# Patient Record
Sex: Male | Born: 1935
Health system: Southern US, Community
[De-identification: ages and names within clinical notes are randomized; demographics above are authoritative.]

## PROBLEM LIST (undated history)

## (undated) DIAGNOSIS — I10 Essential (primary) hypertension: Secondary | ICD-10-CM

## (undated) DIAGNOSIS — M1990 Unspecified osteoarthritis, unspecified site: Secondary | ICD-10-CM

## (undated) DIAGNOSIS — R7303 Prediabetes: Secondary | ICD-10-CM

## (undated) DIAGNOSIS — N400 Enlarged prostate without lower urinary tract symptoms: Secondary | ICD-10-CM

## (undated) DIAGNOSIS — E559 Vitamin D deficiency, unspecified: Secondary | ICD-10-CM

## (undated) DIAGNOSIS — M109 Gout, unspecified: Secondary | ICD-10-CM

## (undated) DIAGNOSIS — K219 Gastro-esophageal reflux disease without esophagitis: Secondary | ICD-10-CM

## (undated) DIAGNOSIS — E785 Hyperlipidemia, unspecified: Secondary | ICD-10-CM

## (undated) HISTORY — DX: Hyperlipidemia, unspecified: E78.5

## (undated) HISTORY — PX: LEG SURGERY: SHX1003

## (undated) HISTORY — DX: Prediabetes: R73.03

## (undated) HISTORY — PX: BACK SURGERY: SHX140

## (undated) HISTORY — PX: EYE SURGERY: SHX253

## (undated) HISTORY — DX: Unspecified osteoarthritis, unspecified site: M19.90

## (undated) HISTORY — DX: Vitamin D deficiency, unspecified: E55.9

## (undated) HISTORY — DX: Benign prostatic hyperplasia without lower urinary tract symptoms: N40.0

## (undated) HISTORY — PX: HEMORRHOID SURGERY: SHX153

## (undated) HISTORY — DX: Gout, unspecified: M10.9

## (undated) HISTORY — DX: Gastro-esophageal reflux disease without esophagitis: K21.9

---
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
## 1998-01-25 ENCOUNTER — Other Ambulatory Visit: Admission: RE | Admit: 1998-01-25 | Discharge: 1998-01-25 | Payer: Self-pay | Admitting: Internal Medicine

## 1998-08-04 ENCOUNTER — Ambulatory Visit (HOSPITAL_COMMUNITY): Admission: RE | Admit: 1998-08-04 | Discharge: 1998-08-04 | Payer: Self-pay | Admitting: Internal Medicine

## 1998-08-04 ENCOUNTER — Encounter: Payer: Self-pay | Admitting: Internal Medicine

## 1999-08-18 ENCOUNTER — Ambulatory Visit (HOSPITAL_COMMUNITY): Admission: RE | Admit: 1999-08-18 | Discharge: 1999-08-18 | Payer: Self-pay | Admitting: Internal Medicine

## 1999-08-18 ENCOUNTER — Encounter: Payer: Self-pay | Admitting: Internal Medicine

## 2000-12-25 ENCOUNTER — Encounter: Payer: Self-pay | Admitting: Internal Medicine

## 2000-12-25 ENCOUNTER — Ambulatory Visit (HOSPITAL_COMMUNITY): Admission: RE | Admit: 2000-12-25 | Discharge: 2000-12-25 | Payer: Self-pay | Admitting: Internal Medicine

## 2007-07-22 ENCOUNTER — Ambulatory Visit (HOSPITAL_COMMUNITY): Admission: RE | Admit: 2007-07-22 | Discharge: 2007-07-22 | Payer: Self-pay | Admitting: Internal Medicine

## 2009-08-04 ENCOUNTER — Ambulatory Visit (HOSPITAL_COMMUNITY): Admission: RE | Admit: 2009-08-04 | Discharge: 2009-08-04 | Payer: Self-pay | Admitting: Internal Medicine

## 2010-03-17 ENCOUNTER — Emergency Department (HOSPITAL_COMMUNITY): Admission: EM | Admit: 2010-03-17 | Discharge: 2010-03-17 | Payer: Self-pay | Admitting: Family Medicine

## 2010-05-23 ENCOUNTER — Encounter: Admission: RE | Admit: 2010-05-23 | Discharge: 2010-05-23 | Payer: Self-pay | Admitting: Internal Medicine

## 2010-07-04 ENCOUNTER — Emergency Department (HOSPITAL_COMMUNITY): Admission: EM | Admit: 2010-07-04 | Discharge: 2010-07-04 | Payer: Self-pay | Admitting: Emergency Medicine

## 2010-07-13 ENCOUNTER — Encounter: Admission: RE | Admit: 2010-07-13 | Discharge: 2010-07-13 | Payer: Self-pay | Admitting: Neurology

## 2010-11-01 LAB — CBC
HCT: 41.1 % (ref 39.0–52.0)
MCH: 32.2 pg (ref 26.0–34.0)
Platelets: 174 10*3/uL (ref 150–400)
RDW: 13.3 % (ref 11.5–15.5)

## 2010-11-01 LAB — DIFFERENTIAL
Eosinophils Relative: 1 % (ref 0–5)
Lymphocytes Relative: 33 % (ref 12–46)
Monocytes Absolute: 0.6 10*3/uL (ref 0.1–1.0)
Neutro Abs: 3.9 10*3/uL (ref 1.7–7.7)
Neutrophils Relative %: 57 % (ref 43–77)

## 2011-02-23 ENCOUNTER — Other Ambulatory Visit (HOSPITAL_COMMUNITY): Payer: Self-pay | Admitting: Internal Medicine

## 2011-02-23 ENCOUNTER — Ambulatory Visit (HOSPITAL_COMMUNITY)
Admission: RE | Admit: 2011-02-23 | Discharge: 2011-02-23 | Disposition: A | Discharge status: Home / Self Care | Payer: Medicare Other | Source: Ambulatory Visit | Attending: Internal Medicine | Admitting: Internal Medicine

## 2011-02-23 DIAGNOSIS — M79609 Pain in unspecified limb: Secondary | ICD-10-CM

## 2011-02-23 DIAGNOSIS — S9030XA Contusion of unspecified foot, initial encounter: Secondary | ICD-10-CM | POA: Insufficient documentation

## 2011-02-23 DIAGNOSIS — W1809XA Striking against other object with subsequent fall, initial encounter: Secondary | ICD-10-CM | POA: Insufficient documentation

## 2011-02-23 DIAGNOSIS — M7989 Other specified soft tissue disorders: Secondary | ICD-10-CM | POA: Insufficient documentation

## 2011-07-28 ENCOUNTER — Emergency Department (INDEPENDENT_AMBULATORY_CARE_PROVIDER_SITE_OTHER): Payer: Medicare Other

## 2011-07-28 ENCOUNTER — Encounter: Payer: Self-pay | Admitting: *Deleted

## 2011-07-28 ENCOUNTER — Emergency Department (INDEPENDENT_AMBULATORY_CARE_PROVIDER_SITE_OTHER)
Admission: EM | Admit: 2011-07-28 | Discharge: 2011-07-28 | Disposition: A | Discharge status: Home / Self Care | Payer: Medicare Other | Source: Home / Self Care | Attending: Emergency Medicine | Admitting: Emergency Medicine

## 2011-07-28 DIAGNOSIS — S8000XA Contusion of unspecified knee, initial encounter: Secondary | ICD-10-CM

## 2011-07-28 DIAGNOSIS — S40019A Contusion of unspecified shoulder, initial encounter: Secondary | ICD-10-CM

## 2011-07-28 DIAGNOSIS — S40012A Contusion of left shoulder, initial encounter: Secondary | ICD-10-CM

## 2011-07-28 DIAGNOSIS — S8002XA Contusion of left knee, initial encounter: Secondary | ICD-10-CM

## 2011-07-28 DIAGNOSIS — S40011A Contusion of right shoulder, initial encounter: Secondary | ICD-10-CM

## 2011-07-28 DIAGNOSIS — S8001XA Contusion of right knee, initial encounter: Secondary | ICD-10-CM

## 2011-07-28 HISTORY — DX: Essential (primary) hypertension: I10

## 2011-07-28 MED ORDER — HYDROCODONE-ACETAMINOPHEN 5-325 MG PO TABS: ORAL_TABLET | ORAL | Status: AC
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
## 2011-11-23 DIAGNOSIS — Z79899 Other long term (current) drug therapy: Secondary | ICD-10-CM | POA: Diagnosis not present

## 2011-11-23 DIAGNOSIS — Z1212 Encounter for screening for malignant neoplasm of rectum: Secondary | ICD-10-CM | POA: Diagnosis not present

## 2011-11-23 DIAGNOSIS — I10 Essential (primary) hypertension: Secondary | ICD-10-CM | POA: Diagnosis not present

## 2011-11-23 DIAGNOSIS — E559 Vitamin D deficiency, unspecified: Secondary | ICD-10-CM | POA: Diagnosis not present

## 2011-11-23 DIAGNOSIS — R7309 Other abnormal glucose: Secondary | ICD-10-CM | POA: Diagnosis not present

## 2011-11-23 DIAGNOSIS — E782 Mixed hyperlipidemia: Secondary | ICD-10-CM | POA: Diagnosis not present

## 2011-11-23 DIAGNOSIS — Z125 Encounter for screening for malignant neoplasm of prostate: Secondary | ICD-10-CM | POA: Diagnosis not present

## 2011-12-11 DIAGNOSIS — I10 Essential (primary) hypertension: Secondary | ICD-10-CM | POA: Diagnosis not present

## 2011-12-11 DIAGNOSIS — E782 Mixed hyperlipidemia: Secondary | ICD-10-CM | POA: Diagnosis not present

## 2011-12-25 DIAGNOSIS — M159 Polyosteoarthritis, unspecified: Secondary | ICD-10-CM | POA: Diagnosis not present

## 2011-12-25 DIAGNOSIS — I10 Essential (primary) hypertension: Secondary | ICD-10-CM | POA: Diagnosis not present

## 2012-03-06 ENCOUNTER — Emergency Department (HOSPITAL_COMMUNITY): Payer: Medicare Other

## 2012-03-06 ENCOUNTER — Emergency Department (HOSPITAL_COMMUNITY)
Admission: EM | Admit: 2012-03-06 | Discharge: 2012-03-06 | Disposition: A | Discharge status: Home / Self Care | Payer: Medicare Other | Attending: Emergency Medicine | Admitting: Emergency Medicine

## 2012-03-06 ENCOUNTER — Encounter (HOSPITAL_COMMUNITY): Payer: Self-pay | Admitting: *Deleted

## 2012-03-06 DIAGNOSIS — M549 Dorsalgia, unspecified: Secondary | ICD-10-CM | POA: Diagnosis not present

## 2012-03-06 DIAGNOSIS — Y92009 Unspecified place in unspecified non-institutional (private) residence as the place of occurrence of the external cause: Secondary | ICD-10-CM | POA: Insufficient documentation

## 2012-03-06 DIAGNOSIS — K8020 Calculus of gallbladder without cholecystitis without obstruction: Secondary | ICD-10-CM | POA: Insufficient documentation

## 2012-03-06 DIAGNOSIS — M25579 Pain in unspecified ankle and joints of unspecified foot: Secondary | ICD-10-CM | POA: Diagnosis not present

## 2012-03-06 DIAGNOSIS — M79609 Pain in unspecified limb: Secondary | ICD-10-CM | POA: Diagnosis not present

## 2012-03-06 DIAGNOSIS — I10 Essential (primary) hypertension: Secondary | ICD-10-CM | POA: Diagnosis not present

## 2012-03-06 DIAGNOSIS — M7989 Other specified soft tissue disorders: Secondary | ICD-10-CM | POA: Diagnosis not present

## 2012-03-06 DIAGNOSIS — R51 Headache: Secondary | ICD-10-CM | POA: Diagnosis not present

## 2012-03-06 DIAGNOSIS — S20219A Contusion of unspecified front wall of thorax, initial encounter: Secondary | ICD-10-CM | POA: Diagnosis not present

## 2012-03-06 DIAGNOSIS — K5730 Diverticulosis of large intestine without perforation or abscess without bleeding: Secondary | ICD-10-CM | POA: Diagnosis not present

## 2012-03-06 DIAGNOSIS — R109 Unspecified abdominal pain: Secondary | ICD-10-CM | POA: Diagnosis not present

## 2012-03-06 DIAGNOSIS — M542 Cervicalgia: Secondary | ICD-10-CM | POA: Diagnosis not present

## 2012-03-06 DIAGNOSIS — M25559 Pain in unspecified hip: Secondary | ICD-10-CM | POA: Diagnosis not present

## 2012-03-06 DIAGNOSIS — M19079 Primary osteoarthritis, unspecified ankle and foot: Secondary | ICD-10-CM | POA: Diagnosis not present

## 2012-03-06 DIAGNOSIS — R079 Chest pain, unspecified: Secondary | ICD-10-CM | POA: Diagnosis not present

## 2012-03-06 DIAGNOSIS — Z79899 Other long term (current) drug therapy: Secondary | ICD-10-CM | POA: Diagnosis not present

## 2012-03-06 DIAGNOSIS — Y93H9 Activity, other involving exterior property and land maintenance, building and construction: Secondary | ICD-10-CM | POA: Insufficient documentation

## 2012-03-06 LAB — COMPREHENSIVE METABOLIC PANEL
ALT: 21 U/L (ref 0–53)
Alkaline Phosphatase: 56 U/L (ref 39–117)
BUN: 17 mg/dL (ref 6–23)
CO2: 24 mEq/L (ref 19–32)
GFR calc Af Amer: >90 mL/min (ref >90)
GFR calc non Af Amer: >90 mL/min (ref >90)
Glucose, Bld: 101 mg/dL — ABNORMAL HIGH (ref 70–99)
Potassium: 4.2 mEq/L (ref 3.5–5.1)
Sodium: 137 mEq/L (ref 135–145)
Total Bilirubin: 0.7 mg/dL (ref 0.3–1.2)
Total Protein: 6.9 g/dL (ref 6.0–8.3)

## 2012-03-06 LAB — CBC
HCT: 43.7 % (ref 39.0–52.0)
Hemoglobin: 14.6 g/dL (ref 13.0–17.0)
MCHC: 33.4 g/dL (ref 30.0–36.0)
RBC: 4.62 MIL/uL (ref 4.22–5.81)

## 2012-03-06 MED ORDER — IOHEXOL 300 MG/ML  SOLN
100.0000 mL | Freq: Once | INTRAMUSCULAR | Status: AC | PRN
  Administered 2012-03-06: 100 mL via INTRAVENOUS

## 2012-03-06 MED ORDER — OXYCODONE-ACETAMINOPHEN 5-325 MG PO TABS: 1.0000 | ORAL_TABLET | ORAL | Status: AC | PRN

## 2012-03-06 MED ORDER — HYDROMORPHONE HCL PF 1 MG/ML IJ SOLN
1.0000 mg | Freq: Once | INTRAMUSCULAR | Status: AC
  Administered 2012-03-06: 1 mg via INTRAVENOUS
  Filled 2012-03-06: qty 1

## 2012-03-06 NOTE — Progress Notes (Signed)
Orthopedic Tech Progress Note Patient Details:  Tracy Brandt 06-25-1930 [id number]  Ortho Devices Type of Ortho Device: ASO Ortho Device/Splint Location: right LE Ortho Device/Splint Interventions: Application   Prabhjot Maddux T 03/06/2012, 5:33 PM

## 2012-03-06 NOTE — Progress Notes (Signed)
Responded to lvl 2 pg.  Located pt son in hallway of ed outside tr B.  Introduced myself and offered pastoral presence and support while pt was treated.  Assisted son with visiting pt once able per staff.  Pt went for ct.  Offered to be available as needed.  Pt son thanked me for support and presence.  Will follow-up as needed or requested.
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
## 2012-03-06 NOTE — ED Notes (Signed)
Pt back from CT

## 2012-03-06 NOTE — ED Notes (Signed)
Pt to CT

## 2012-03-12 DIAGNOSIS — Z79899 Other long term (current) drug therapy: Secondary | ICD-10-CM | POA: Diagnosis not present

## 2012-03-12 DIAGNOSIS — E559 Vitamin D deficiency, unspecified: Secondary | ICD-10-CM | POA: Diagnosis not present

## 2012-03-12 DIAGNOSIS — R7309 Other abnormal glucose: Secondary | ICD-10-CM | POA: Diagnosis not present

## 2012-03-12 DIAGNOSIS — I10 Essential (primary) hypertension: Secondary | ICD-10-CM | POA: Diagnosis not present

## 2012-03-12 DIAGNOSIS — E782 Mixed hyperlipidemia: Secondary | ICD-10-CM | POA: Diagnosis not present

## 2012-06-12 DIAGNOSIS — E559 Vitamin D deficiency, unspecified: Secondary | ICD-10-CM | POA: Diagnosis not present

## 2012-06-12 DIAGNOSIS — E782 Mixed hyperlipidemia: Secondary | ICD-10-CM | POA: Diagnosis not present

## 2012-06-12 DIAGNOSIS — M159 Polyosteoarthritis, unspecified: Secondary | ICD-10-CM | POA: Diagnosis not present

## 2012-06-12 DIAGNOSIS — Z79899 Other long term (current) drug therapy: Secondary | ICD-10-CM | POA: Diagnosis not present

## 2012-06-12 DIAGNOSIS — R7309 Other abnormal glucose: Secondary | ICD-10-CM | POA: Diagnosis not present

## 2012-06-12 DIAGNOSIS — I10 Essential (primary) hypertension: Secondary | ICD-10-CM | POA: Diagnosis not present

## 2012-11-22 DIAGNOSIS — Z1212 Encounter for screening for malignant neoplasm of rectum: Secondary | ICD-10-CM | POA: Diagnosis not present

## 2012-11-22 DIAGNOSIS — E559 Vitamin D deficiency, unspecified: Secondary | ICD-10-CM | POA: Diagnosis not present

## 2012-11-22 DIAGNOSIS — R7309 Other abnormal glucose: Secondary | ICD-10-CM | POA: Diagnosis not present

## 2012-11-22 DIAGNOSIS — I10 Essential (primary) hypertension: Secondary | ICD-10-CM | POA: Diagnosis not present

## 2012-11-22 DIAGNOSIS — Z79899 Other long term (current) drug therapy: Secondary | ICD-10-CM | POA: Diagnosis not present

## 2012-11-22 DIAGNOSIS — E782 Mixed hyperlipidemia: Secondary | ICD-10-CM | POA: Diagnosis not present

## 2012-11-22 DIAGNOSIS — Z125 Encounter for screening for malignant neoplasm of prostate: Secondary | ICD-10-CM | POA: Diagnosis not present

## 2012-12-01 ENCOUNTER — Emergency Department (HOSPITAL_COMMUNITY): Payer: Medicare Other

## 2012-12-01 ENCOUNTER — Encounter (HOSPITAL_COMMUNITY): Payer: Self-pay

## 2012-12-01 ENCOUNTER — Inpatient Hospital Stay (HOSPITAL_COMMUNITY)
Admission: EM | Admit: 2012-12-01 | Discharge: 2012-12-03 | DRG: 193 | Disposition: A | Discharge status: Home Health | Payer: Medicare Other | Attending: Internal Medicine | Admitting: Internal Medicine

## 2012-12-01 DIAGNOSIS — J9600 Acute respiratory failure, unspecified whether with hypoxia or hypercapnia: Secondary | ICD-10-CM | POA: Diagnosis not present

## 2012-12-01 DIAGNOSIS — A419 Sepsis, unspecified organism: Secondary | ICD-10-CM

## 2012-12-01 DIAGNOSIS — D696 Thrombocytopenia, unspecified: Secondary | ICD-10-CM | POA: Diagnosis present

## 2012-12-01 DIAGNOSIS — Z79899 Other long term (current) drug therapy: Secondary | ICD-10-CM

## 2012-12-01 DIAGNOSIS — R6520 Severe sepsis without septic shock: Secondary | ICD-10-CM | POA: Diagnosis not present

## 2012-12-01 DIAGNOSIS — J984 Other disorders of lung: Secondary | ICD-10-CM | POA: Diagnosis not present

## 2012-12-01 DIAGNOSIS — I10 Essential (primary) hypertension: Secondary | ICD-10-CM | POA: Diagnosis not present

## 2012-12-01 DIAGNOSIS — IMO0002 Reserved for concepts with insufficient information to code with codable children: Secondary | ICD-10-CM | POA: Diagnosis not present

## 2012-12-01 DIAGNOSIS — J189 Pneumonia, unspecified organism: Secondary | ICD-10-CM | POA: Diagnosis not present

## 2012-12-01 DIAGNOSIS — E871 Hypo-osmolality and hyponatremia: Secondary | ICD-10-CM | POA: Diagnosis present

## 2012-12-01 DIAGNOSIS — I499 Cardiac arrhythmia, unspecified: Secondary | ICD-10-CM

## 2012-12-01 DIAGNOSIS — I498 Other specified cardiac arrhythmias: Secondary | ICD-10-CM | POA: Diagnosis not present

## 2012-12-01 DIAGNOSIS — E872 Acidosis: Secondary | ICD-10-CM

## 2012-12-01 DIAGNOSIS — R0602 Shortness of breath: Secondary | ICD-10-CM | POA: Diagnosis not present

## 2012-12-01 LAB — URINALYSIS, ROUTINE W REFLEX MICROSCOPIC
Ketones, ur: NEGATIVE mg/dL
Leukocytes, UA: NEGATIVE
Nitrite: NEGATIVE
pH: 5 (ref 5.0–8.0)

## 2012-12-01 LAB — PRO B NATRIURETIC PEPTIDE: Pro B Natriuretic peptide (BNP): 1909 pg/mL — ABNORMAL HIGH (ref 0–450)

## 2012-12-01 LAB — COMPREHENSIVE METABOLIC PANEL
Albumin: 3.1 g/dL — ABNORMAL LOW (ref 3.5–5.2)
BUN: 11 mg/dL (ref 6–23)
Chloride: 99 mEq/L (ref 96–112)
Creatinine, Ser: 0.63 mg/dL (ref 0.50–1.35)
Total Bilirubin: 2.1 mg/dL — ABNORMAL HIGH (ref 0.3–1.2)
Total Protein: 6.1 g/dL (ref 6.0–8.3)

## 2012-12-01 LAB — CBC
HCT: 38.1 % — ABNORMAL LOW (ref 39.0–52.0)
MCH: 32 pg (ref 26.0–34.0)
MCHC: 34.4 g/dL (ref 30.0–36.0)
MCV: 92.9 fL (ref 78.0–100.0)
RDW: 12.3 % (ref 11.5–15.5)

## 2012-12-01 LAB — TROPONIN I: Troponin I: <0.3 ng/mL (ref <0.30)

## 2012-12-01 LAB — LACTIC ACID, PLASMA: Lactic Acid, Venous: 2.3 mmol/L — ABNORMAL HIGH (ref 0.5–2.2)

## 2012-12-01 MED ORDER — ATENOLOL 100 MG PO TABS
100.0000 mg | ORAL_TABLET | Freq: Every day | ORAL | Status: DC
  Filled 2012-12-01: qty 1

## 2012-12-01 MED ORDER — PREDNISONE 2.5 MG PO TABS
2.5000 mg | ORAL_TABLET | Freq: Every day | ORAL | Status: DC
  Administered 2012-12-02 – 2012-12-03 (×2): 2.5 mg via ORAL
  Filled 2012-12-01 (×2): qty 1

## 2012-12-01 MED ORDER — HYDROCODONE-ACETAMINOPHEN 5-325 MG PO TABS
0.5000 | ORAL_TABLET | Freq: Four times a day (QID) | ORAL | Status: DC | PRN
  Administered 2012-12-02 – 2012-12-03 (×5): 1 via ORAL
  Filled 2012-12-01 (×5): qty 1

## 2012-12-01 MED ORDER — DEXTROSE 5 % IV SOLN
500.0000 mg | INTRAVENOUS | Status: DC
  Administered 2012-12-02: 500 mg via INTRAVENOUS
  Filled 2012-12-01 (×2): qty 500

## 2012-12-01 MED ORDER — LISINOPRIL 20 MG PO TABS: 20.0000 mg | ORAL_TABLET | Freq: Two times a day (BID) | ORAL | Status: DC

## 2012-12-01 MED ORDER — SODIUM CHLORIDE 0.9 % IV SOLN: 250.0000 mL | INTRAVENOUS | Status: DC | PRN

## 2012-12-01 MED ORDER — DEXTROSE 5 % IV SOLN
500.0000 mg | Freq: Once | INTRAVENOUS | Status: DC
  Administered 2012-12-01: 500 mg via INTRAVENOUS
  Filled 2012-12-01: qty 500

## 2012-12-01 MED ORDER — LISINOPRIL 20 MG PO TABS
20.0000 mg | ORAL_TABLET | Freq: Every day | ORAL | Status: DC
  Administered 2012-12-02 – 2012-12-03 (×2): 20 mg via ORAL
  Filled 2012-12-01 (×2): qty 1

## 2012-12-01 MED ORDER — SODIUM CHLORIDE 0.9 % IJ SOLN
3.0000 mL | Freq: Two times a day (BID) | INTRAMUSCULAR | Status: DC
  Administered 2012-12-01 – 2012-12-03 (×4): 3 mL via INTRAVENOUS

## 2012-12-01 MED ORDER — DEXTROSE 5 % IV SOLN
1.0000 g | Freq: Once | INTRAVENOUS | Status: AC
  Administered 2012-12-01: 1 g via INTRAVENOUS
  Filled 2012-12-01: qty 10

## 2012-12-01 MED ORDER — LISINOPRIL 40 MG PO TABS
40.0000 mg | ORAL_TABLET | Freq: Every day | ORAL | Status: DC
  Administered 2012-12-01 – 2012-12-02 (×2): 40 mg via ORAL
  Filled 2012-12-01 (×3): qty 1

## 2012-12-01 MED ORDER — AZITHROMYCIN 250 MG PO TABS: 500.0000 mg | ORAL_TABLET | ORAL | Status: DC

## 2012-12-01 MED ORDER — DEXTROSE 5 % IV SOLN
1.0000 g | INTRAVENOUS | Status: DC
  Administered 2012-12-02: 1 g via INTRAVENOUS
  Filled 2012-12-01 (×2): qty 10

## 2012-12-01 MED ORDER — AZITHROMYCIN 500 MG IV SOLR: 500.0000 mg | INTRAVENOUS | Status: DC

## 2012-12-01 MED ORDER — ALBUTEROL SULFATE (5 MG/ML) 0.5% IN NEBU: 2.5000 mg | INHALATION_SOLUTION | RESPIRATORY_TRACT | Status: DC | PRN

## 2012-12-01 MED ORDER — DOXAZOSIN MESYLATE 8 MG PO TABS
8.0000 mg | ORAL_TABLET | Freq: Every day | ORAL | Status: DC
  Administered 2012-12-01 – 2012-12-02 (×2): 8 mg via ORAL
  Filled 2012-12-01 (×3): qty 1

## 2012-12-01 MED ORDER — DEXTROSE 5 % IV SOLN: 500.0000 mg | INTRAVENOUS | Status: DC

## 2012-12-01 MED ORDER — ENOXAPARIN SODIUM 30 MG/0.3ML ~~LOC~~ SOLN
30.0000 mg | SUBCUTANEOUS | Status: DC
  Administered 2012-12-01: 30 mg via SUBCUTANEOUS
  Filled 2012-12-01 (×2): qty 0.3

## 2012-12-01 MED ORDER — DEXTROSE 5 % IV SOLN: 1.0000 g | INTRAVENOUS | Status: DC

## 2012-12-01 MED ORDER — SODIUM CHLORIDE 0.9 % IV BOLUS (SEPSIS)
500.0000 mL | INTRAVENOUS | Status: DC | PRN
  Administered 2012-12-01: 500 mL via INTRAVENOUS

## 2012-12-01 MED ORDER — SODIUM CHLORIDE 0.9 % IJ SOLN: 3.0000 mL | INTRAMUSCULAR | Status: DC | PRN

## 2012-12-01 NOTE — ED Notes (Signed)
Per EMS, pt reports fever x 2 days.  Pt began having SOB today.  EMS reports rhonchi bilateral.  5 mg Albuterol neb given.  IV started and 125 mg Solu-Medrol given.

## 2012-12-01 NOTE — H&P (Signed)
[facility] History and Physical  CARI VANDEBERG GEX:[id number] DOB: May 16, 1930 DOA: 12/01/2012  Referring physician: ER physician PCP: Nadean Corwin, MD   Chief Complaint:  Shortness of breath   HPI:  Pt is 79 yo male who presented to [facility] ED with main concern of progressively worsening shortness of breath that started 2-3 prior to admission, associated with fevers, chills, productive cough of yellow sputum, malaise. Pt denies similar events in the past, no recent sick contacts or exposures, no recent hospitalizations. Pt denies chest pain other that when discomfort is present with coughing, pt also denies palpitations, dizziness, specific focal neurological concerns, no headaches, no visual changes, no lower extremity swelling.  In ED, pt initially with increased work of breathing. He has responded well to albuterol nebulizer and oxygen via [place]. Also one dose of Solumedrol was given. CXR demonstrated PNA. TRH as ked to admit for management of presumptive CAP.  Principal Problem:   Acute respiratory failure - most likely secondary to PNA, specifically CAP - will admit to telemetry bed and will start with ABX coverage Zithromax and Rocephin - obtain sputum analysis, urine legionella and strep pneumo - provide supportive care with nebulizer Albuterol as needed, oxygen via [place]  Active Problems:   PNA (pneumonia) - treat with ABX as noted above   Irregular heart rate - currently in NSR but had several bradycardic and tachycardic episodes in ED, ? SSS - will obtain 12 lead EKG, no need for CE's as pt denies chest pain    Hyponatremia - mild, possibly related to pre renal etiology - will obtain BMP in AM   Thrombocytopenia - mild, no active bleeding reported per pt - will start Lovenox for DVT prophylaxis for now - CBC in AM  Review of Systems:  Constitutional: Positive for fever, chills and malaise/fatigue. Negative for diaphoresis.  HENT: Negative for hearing loss, ear  pain, nosebleeds, congestion, sore throat, neck pain, tinnitus and ear discharge.   Eyes: Negative for blurred vision, double vision, photophobia, pain, discharge and redness.  Respiratory: Positive for cough, sputum production, shortness of breath, stridor.   Cardiovascular: Negative for chest pain, palpitations, orthopnea, claudication and leg swelling.  Gastrointestinal: Negative for nausea, vomiting and abdominal pain. Negative for heartburn, constipation, blood in stool and melena.  Genitourinary: Negative for dysuria, urgency, frequency, hematuria and flank pain.  Musculoskeletal: Negative for myalgias, back pain, joint pain and falls.  Skin: Negative for itching and rash.  Neurological: Negative for dizziness and weakness. Negative for tingling, tremors, sensory change, speech change, focal weakness, loss of consciousness and headaches.  Endo/Heme/Allergies: Negative for environmental allergies and polydipsia. Does not bruise/bleed easily.  Psychiatric/Behavioral: Negative for suicidal ideas. The patient is not nervous/anxious.      Past Medical History  Diagnosis Date  . Hypertension    Past Surgical History  Procedure Laterality Date  . Back surgery    . Leg surgery    . Hemorrhoid surgery    . Eye surgery     Social History:  reports that he has never smoked. He does not have any smokeless tobacco history on file. He reports that he does not drink alcohol. His drug history is not on file.  No Known Allergies  Family History: no history of cancers, no cardiovascular diseases on mother or father side  Prior to Admission medications   Medication Sig Start Date End Date Taking? Authorizing Provider  atenolol (TENORMIN) 100 MG tablet Take 100 mg by mouth daily.   Yes Historical Provider,  MD  atorvastatin (LIPITOR) 40 MG tablet Take 40 mg by mouth daily.     Yes Historical Provider, MD  Cholecalciferol (VITAMIN D PO) Take 1 tablet by mouth daily.   Yes Historical Provider, MD   doxazosin (CARDURA) 8 MG tablet Take 8 mg by mouth at bedtime.   Yes Historical Provider, MD  fish oil-omega-3 fatty acids 1000 MG capsule Take 1 g by mouth daily.   Yes Historical Provider, MD  HYDROcodone-acetaminophen (NORCO/VICODIN) 5-325 MG per tablet Take 0.5-1 tablets by mouth every 6 (six) hours as needed for pain.   Yes Historical Provider, MD  lisinopril (PRINIVIL,ZESTRIL) 40 MG tablet Take 20-40 mg by mouth 2 (two) times daily. Take 1/2 a tablet in the morning and 1 tablet in the evening   Yes Historical Provider, MD  Magnesium 250 MG TABS Take 250 mg by mouth daily.   Yes Historical Provider, MD  predniSONE (DELTASONE) 10 MG tablet Take 2.5 mg by mouth daily.   Yes Historical Provider, MD   Physical Exam: Filed Vitals:   12/01/12 1702 12/01/12 1930 12/01/12 1945 12/01/12 2030  BP: 117/56 98/58 125/78 115/59  Pulse: 73 37 40 115  Temp: 102.3 F (39.1 C)     TempSrc: Axillary     Resp: 38 20 25 25   SpO2: 100% 97% 94% 96%    Physical Exam  Constitutional: Appears well-developed and well-nourished. No distress.  HENT: Normocephalic. External right and left ear normal. Oropharynx is clear and moist.  Eyes: Conjunctivae and EOM are normal. PERRLA, no scleral icterus.  Neck: Normal ROM. Neck supple. No JVD. No tracheal deviation. No thyromegaly.  CVS: RRR, S1/S2 +, no murmurs, no gallops, no carotid bruit.  Pulmonary: Effort and breath sounds normal, rhonchi bilaterally  Abdominal: Soft. BS +,  no distension, tenderness, rebound or guarding.  Musculoskeletal: Normal range of motion. No edema and no tenderness.  Lymphadenopathy: No lymphadenopathy noted, cervical, inguinal. Neuro: Alert. Normal reflexes, muscle tone coordination. No cranial nerve deficit. Skin: Skin is warm and dry. No rash noted. Not diaphoretic. No erythema. No pallor.  Psychiatric: Normal mood and affect. Behavior, judgment, thought content normal.   Labs on Admission:  Basic Metabolic Panel:  Recent  Labs Lab 12/01/12 1854  NA 132*  K 3.9  CL 99  CO2 23  GLUCOSE 122*  BUN 11  CREATININE 0.63  CALCIUM 8.6   Liver Function Tests:  Recent Labs Lab 12/01/12 1854  AST 15  ALT 13  ALKPHOS 41  BILITOT 2.1*  PROT 6.1  ALBUMIN 3.1*   CBC:  Recent Labs Lab 12/01/12 1854  WBC 6.9  HGB 13.1  HCT 38.1*  MCV 92.9  PLT 124*   Cardiac Enzymes:  Recent Labs Lab 12/01/12 1900  TROPONINI <0.30   Radiological Exams on Admission: Dg Chest 2 View (if Patient Has Fever And/or Copd)  12/01/2012  *RADIOLOGY REPORT*  Clinical Data: Shortness of breath, cough and fever.  CHEST - 2 VIEW  Comparison: CT and plain film of the chest 03/06/2012.  Findings: The patient has bibasilar airspace disease.  There is cardiomegaly. No pneumothorax or pleural fluid.  IMPRESSION: Bibasilar airspace disease worrisome for pneumonia.   Original Report Authenticated By: Holley Dexter, M.D.     EKG: Normal sinus rhythm, no ST/T wave changes  Code Status: Full Family Communication: Pt at bedside Disposition Plan: Admit for further evaluation  Manson Passey, MD  [facility] Pager [phone]  If 7PM-7AM, please contact night-coverage [email or web address] Password [facility] 12/01/2012, 8:55  PM       

## 2012-12-01 NOTE — ED Notes (Signed)
Family Contact 1. Marylene Land (daughter-in-law) [phone] 2. Lynden Ang (daughter-in-law) [phone]

## 2012-12-01 NOTE — Progress Notes (Signed)
ABX consult  Pharmacy asked to dose rocephin/azith for PNA. Both of these do not need to be renally adjusted.   Plan  Cont rocephin 1g IV q24 Azthromycin 500mg  IV q24 Rx to sign off

## 2012-12-01 NOTE — ED Notes (Signed)
Pt switched to O2 via [place] at 2 L/min.  Will monitor.

## 2012-12-01 NOTE — ED Provider Notes (Signed)
History     CSN: [id number]  Arrival date & time 12/01/12  1655   First MD Initiated Contact with Patient 12/01/12 1839      Chief Complaint  Patient presents with  . Shortness of Breath    (Consider location/radiation/quality/duration/timing/severity/associated sxs/prior treatment) HPI Comments: 79 y M with PMH of HTN here with fevers, cough, chills and rigors x 3 days.  Received 125 mg Solumedrol and albuterol neb by EMS.  + nausea, but denies CP, Abd pain, and urinary symptoms on ROS.  He reports that he was seen by his PCP about a week ago and had his lasix stopped.  Patient is a 79 y.o. male presenting with cough. The history is provided by the patient.  Cough Cough characteristics:  Productive Sputum characteristics:  Yellow Severity:  Mild Onset quality:  Gradual Duration:  3 days Timing:  Constant Progression:  Worsening Chronicity:  New Smoker: no (previously)   Associated symptoms: shortness of breath   Associated symptoms: no chest pain     Past Medical History  Diagnosis Date  . Hypertension     Past Surgical History  Procedure Laterality Date  . Back surgery    . Leg surgery    . Hemorrhoid surgery    . Eye surgery      History reviewed. No pertinent family history.  History  Substance Use Topics  . Smoking status: Never Smoker   . Smokeless tobacco: Not on file  . Alcohol Use: No      Review of Systems  Respiratory: Positive for cough and shortness of breath.   Cardiovascular: Negative for chest pain.  All other systems reviewed and are negative.    Allergies  Review of patient's allergies indicates no known allergies.  Home Medications   Current Outpatient Rx  Name  Route  Sig  Dispense  Refill  . atorvastatin (LIPITOR) 40 MG tablet   Oral   Take 40 mg by mouth daily.           . Cholecalciferol (VITAMIN D PO)   Oral   Take 1 tablet by mouth daily.         . fish oil-omega-3 fatty acids 1000 MG capsule   Oral   Take 1  g by mouth daily.         . hydrochlorothiazide (HYDRODIURIL) 25 MG tablet   Oral   Take 25 mg by mouth daily.           Marland Kitchen lisinopril (PRINIVIL,ZESTRIL) 40 MG tablet   Oral   Take 20-40 mg by mouth 2 (two) times daily. Take 1/2 a tablet in the morning and 1 tablet in the evening         . Magnesium 250 MG TABS   Oral   Take 250 mg by mouth daily.           BP 117/56  Pulse 73  Temp(Src) 102.3 F (39.1 C) (Axillary)  Resp 38  SpO2 100%  Physical Exam  Vitals reviewed. Constitutional: He is oriented to person, place, and time. He appears well-developed and well-nourished. No distress.  HENT:  Head: Normocephalic.  Right Ear: External ear normal.  Left Ear: External ear normal.  Nose: Nose normal.  Mouth/Throat: Oropharynx is clear and moist. No oropharyngeal exudate.  Eyes: Conjunctivae and EOM are normal. Pupils are equal, round, and reactive to light.  Neck: Normal range of motion. Neck supple.  Cardiovascular: Normal rate, regular rhythm, normal heart sounds and intact distal pulses.  Exam reveals no gallop and no friction rub.   No murmur heard. Pulmonary/Chest: Effort normal. He has rales.  Abdominal: Soft. Bowel sounds are normal. He exhibits no distension. There is no tenderness.  Musculoskeletal: Normal range of motion. He exhibits no edema and no tenderness.  Neurological: He is alert and oriented to person, place, and time. No cranial nerve deficit.  Skin: Skin is warm and dry.  Psychiatric: He has a normal mood and affect.    ED Course  Procedures (including critical care time)  Labs Reviewed  CBC - Abnormal; Notable for the following:    RBC 4.10 (*)    HCT 38.1 (*)    Platelets 124 (*)    All other components within normal limits  COMPREHENSIVE METABOLIC PANEL - Abnormal; Notable for the following:    Sodium 132 (*)    Glucose, Bld 122 (*)    Albumin 3.1 (*)    Total Bilirubin 2.1 (*)    All other components within normal limits  LACTIC  ACID, PLASMA - Abnormal; Notable for the following:    Lactic Acid, Venous 2.3 (*)    All other components within normal limits  PRO B NATRIURETIC PEPTIDE - Abnormal; Notable for the following:    Pro B Natriuretic peptide (BNP) 1909.0 (*)    All other components within normal limits  CULTURE, BLOOD (ROUTINE X 2)  CULTURE, BLOOD (ROUTINE X 2)  URINE CULTURE  CULTURE, EXPECTORATED SPUTUM-ASSESSMENT  GRAM STAIN  URINALYSIS, ROUTINE W REFLEX MICROSCOPIC  TROPONIN I           Dg Chest 2 View (if Patient Has Fever And/or Copd)  12/01/2012  *RADIOLOGY REPORT*  Clinical Data: Shortness of breath, cough and fever.  CHEST - 2 VIEW  Comparison: CT and plain film of the chest 03/06/2012.  Findings: The patient has bibasilar airspace disease.  There is cardiomegaly. No pneumothorax or pleural fluid.  IMPRESSION: Bibasilar airspace disease worrisome for pneumonia.   Original Report Authenticated By: Holley Dexter, M.D.     Date: 12/02/2012  Rate: 73  Rhythm: normal sinus rhythm  QRS Axis: normal  Intervals: normal  ST/T Wave abnormalities: normal  Conduction Disutrbances:? RSR' pattern in V1  Narrative Interpretation:   Old EKG Reviewed: none available     1. PNA (pneumonia)   2. Acute respiratory failure   3. Hyponatremia   4. Lactic acidosis   5. Sepsis       MDM   79 y M with PMH of HTN here with fevers, cough, chills and rigors x 3 days.  Received 125 mg Solumedrol and albuterol neb by EMS.  + nausea, but denies CP, Abd pain, and urinary symptoms on ROS.  He reports that he was seen by his PCP about a week ago and had his lasix stopped.  Febrile, hypotensive and hypoxic on arrival.  Normal HR, but takes BB.  Mentating well.  Lungs with crackles in lower lung fields.  Abd soft.  No edema.  Stable on 2L [place].  Triage CXR with PNA.  WIll treat as CAP with Azithro/Rocephin.  Sepsis labs, trop, bnp.  9:00 Hospitalist consulted for admission.  BP stable after 500 cc  bolus.  Disposition: Admit  Condition: Stable  Pt seen in conjunction with my attending, Dr. Juleen China.  Oleh Genin, MD PGY-II [facility] Emergency Medicine Resident     Oleh Genin, MD 12/02/12 [phone]

## 2012-12-02 DIAGNOSIS — J9600 Acute respiratory failure, unspecified whether with hypoxia or hypercapnia: Secondary | ICD-10-CM

## 2012-12-02 DIAGNOSIS — J189 Pneumonia, unspecified organism: Principal | ICD-10-CM

## 2012-12-02 DIAGNOSIS — D696 Thrombocytopenia, unspecified: Secondary | ICD-10-CM

## 2012-12-02 LAB — BASIC METABOLIC PANEL
CO2: 26 mEq/L (ref 19–32)
Chloride: 100 mEq/L (ref 96–112)
Glucose, Bld: 153 mg/dL — ABNORMAL HIGH (ref 70–99)
Potassium: 4.2 mEq/L (ref 3.5–5.1)
Sodium: 133 mEq/L — ABNORMAL LOW (ref 135–145)

## 2012-12-02 LAB — CBC
HCT: 33.6 % — ABNORMAL LOW (ref 39.0–52.0)
Hemoglobin: 11.8 g/dL — ABNORMAL LOW (ref 13.0–17.0)
MCH: 32 pg (ref 26.0–34.0)
MCV: 91.1 fL (ref 78.0–100.0)
Platelets: 132 10*3/uL — ABNORMAL LOW (ref 150–400)
RBC: 3.69 MIL/uL — ABNORMAL LOW (ref 4.22–5.81)
WBC: 9 10*3/uL (ref 4.0–10.5)

## 2012-12-02 LAB — STREP PNEUMONIAE URINARY ANTIGEN: Strep Pneumo Urinary Antigen: NEGATIVE

## 2012-12-02 MED ORDER — ATENOLOL 100 MG PO TABS
100.0000 mg | ORAL_TABLET | Freq: Every day | ORAL | Status: DC
  Administered 2012-12-02 – 2012-12-03 (×2): 100 mg via ORAL
  Filled 2012-12-02 (×2): qty 1

## 2012-12-02 MED ORDER — ONDANSETRON HCL 4 MG PO TABS
4.0000 mg | ORAL_TABLET | Freq: Four times a day (QID) | ORAL | Status: DC | PRN
  Administered 2012-12-02 – 2012-12-03 (×2): 4 mg via ORAL
  Filled 2012-12-02 (×2): qty 1

## 2012-12-02 MED ORDER — DIPHENHYDRAMINE HCL 12.5 MG/5ML PO ELIX
12.5000 mg | ORAL_SOLUTION | ORAL | Status: DC | PRN
  Administered 2012-12-02: 12.5 mg via ORAL
  Filled 2012-12-02: qty 5

## 2012-12-02 MED ORDER — ONDANSETRON HCL 4 MG/2ML IJ SOLN
4.0000 mg | Freq: Four times a day (QID) | INTRAMUSCULAR | Status: DC | PRN
  Filled 2012-12-02: qty 2

## 2012-12-02 NOTE — Progress Notes (Signed)
RN made aware that pt's HR dropping down into the 40s; nonsustained. Pt is asymptomatic. SB w/ rate maintaining in the 50s. VSS. MD made aware of the above. New orders given.  Will continue to monitor. Benay Pike

## 2012-12-02 NOTE — Evaluation (Signed)
Physical Therapy Evaluation Patient Details Name: Tracy Brandt MRN: [id number] DOB: October 09, 1929 Today's Date: 12/02/2012 Time: 4696-2952 PT Time Calculation (min): 25 min  PT Assessment / Plan / Recommendation Clinical Impression  Pt adm with PNA.  Needs skilled PT to maximize I and safety to pt can return home alone.  Pt's home has been adapted for him.  All seating surfaces he uses are raised.  He only amb household distances and uses Art gallery manager otherwise.  Recommend HHPT at dc.    PT Assessment  Patient needs continued PT services    Follow Up Recommendations  Home health PT    Does the patient have the potential to tolerate intense rehabilitation      Barriers to Discharge        Equipment Recommendations  None recommended by PT    Recommendations for Other Services     Frequency Min 3X/week    Precautions / Restrictions Precautions Precautions: Fall   Pertinent Vitals/Pain SaO2 94% after amb on RA.      Mobility  Transfers Transfers: Sit to Stand;Stand to Sit Sit to Stand: 4: Min assist;With upper extremity assist;From elevated surface;From bed Stand to Sit: 5: Supervision;With upper extremity assist;To elevated surface;To bed Details for Transfer Assistance: Pt only sits on high surfaces at home. Ambulation/Gait Ambulation/Gait Assistance: 5: Supervision Ambulation Distance (Feet): 45 Feet Assistive device: Rolling walker Ambulation/Gait Assistance Details: verbal cues to stand more erect Gait Pattern: Step-through pattern;Decreased stride length;Trunk flexed Gait velocity: decr    Exercises     PT Diagnosis: Difficulty walking;Generalized weakness  PT Problem List: Decreased strength;Decreased mobility PT Treatment Interventions: DME instruction;Gait training;Patient/family education;Functional mobility training;Therapeutic activities;Therapeutic exercise   PT Goals Acute Rehab PT Goals PT Goal Formulation: With patient Time For Goal  Achievement: 12/09/12 Potential to Achieve Goals: Good Pt will go Supine/Side to Sit: with modified independence PT Goal: Supine/Side to Sit - Progress: Goal set today Pt will go Sit to Supine/Side: with modified independence PT Goal: Sit to Supine/Side - Progress: Goal set today Pt will go Sit to Stand: with modified independence PT Goal: Sit to Stand - Progress: Goal set today Pt will go Stand to Sit: with modified independence PT Goal: Stand to Sit - Progress: Goal set today Pt will Ambulate: 16 - 50 feet;with modified independence;with least restrictive assistive device PT Goal: Ambulate - Progress: Goal set today  Visit Information  Last PT Received On: 12/02/12    Subjective Data  Subjective: Pt states, "My problem is getting up.  I have everything jacked up at home." Patient Stated Goal: Return home.   Prior Functioning  Home Living Lives With: Alone Available Help at Discharge: Family;Available PRN/intermittently Type of Home: House Home Access: Ramped entrance Home Layout: One level Bathroom Shower/Tub: Engineer, manufacturing systems: Handicapped height Home Adaptive Equipment: Tub transfer bench;Walker - four wheeled;Electric Scooter;Raised toilet seat with rails Additional Comments: Pt uses rolling office chair in kitchen. 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, advertising Overall Cognitive Status: Appears within functional limits for tasks assessed/performed Arousal/Alertness: Awake/alert Orientation Level: Appears intact for tasks assessed Behavior During Session: [facility] for tasks performed    Extremity/Trunk Assessment Right Lower Extremity Assessment RLE ROM/Strength/Tone: Deficits RLE ROM/Strength/Tone Deficits: grossly 4/5 Left Lower Extremity Assessment LLE ROM/Strength/Tone: Deficits LLE ROM/Strength/Tone Deficits: grossly 4/5    Balance Balance Balance Assessed: Yes Static Standing Balance Static Standing - Balance Support: Bilateral upper extremity  supported Static Standing - Level of Assistance: 6: Modified independent (Device/Increase time)  End of Session PT - End of Session Equipment Utilized During Treatment: Gait belt Activity Tolerance: Patient tolerated treatment well Patient left: in bed;with call bell/phone within reach;with bed alarm set Nurse Communication: Mobility status  GP     [facility] 12/02/2012, 9:27 AM  [facility] PT [phone]

## 2012-12-02 NOTE — Care Management Note (Signed)
    Page 1 of 1   12/02/2012     10:07:29 AM   CARE MANAGEMENT NOTE 12/02/2012  Patient:  Tracy Brandt, Tracy Brandt   Account Number:  [id number]  Date Initiated:  12/02/2012  Documentation initiated by:  Oletta Cohn  Subjective/Objective Assessment:   79 yo male  presented to [facility] ED with main concern of progressively worsening SOB, associated with fevers, chills, productive cough of yellow sputum, malaise     Action/Plan:   Home alone/ Home with [facility] PT   Anticipated DC Date:  12/05/2012   Anticipated DC Plan:  HOME W HOME HEALTH SERVICES      DC Planning Services  CM consult      [facility] Choice  HOME HEALTH   Choice offered to / List presented to:  C-1 Patient        HH arranged  HH-2 PT      [facility] agency  [facility]   Status of service:  Completed, signed off Medicare Important Message given?   (If response is "NO", the following Medicare IM given date fields will be blank) Date Medicare IM given:   Date Additional Medicare IM given:    Discharge Disposition:    Per UR Regulation:  Reviewed for med. necessity/level of care/duration of stay  If discussed at Long Length of Stay Meetings, dates discussed:    Comments:  12/02/12 @ 0945.Marland KitchenMarland KitchenOletta Cohn, RN, BSN, [employer] [phone] Spoke with pt regarding discharge planning.Offered choice for [facility] services.  Pt chose [facility] to render services.  Nolene Ebbs [facility] notified.

## 2012-12-02 NOTE — Progress Notes (Signed)
[facility]             Progress Note   Subjective: Still feels weak. Shortness of breath has improved.  Objective: Vital signs in last 24 hours: Temp:  [98 F (36.7 C)-102.3 F (39.1 C)] 98 F (36.7 C) (04/14 0446) Pulse Rate:  [37-115] 63 (04/14 0446) Resp:  [18-38] 18 (04/14 0446) BP: (70-156)/(32-80) 156/59 mmHg (04/14 0859) SpO2:  [93 %-100 %] 94 % (04/14 0855) Weight:  [105.9 kg (233 lb 7.5 oz)-107.4 kg (236 lb 12.4 oz)] 105.9 kg (233 lb 7.5 oz) (04/14 0446) Weight change:  Last BM Date: 12/01/12  Intake/Output from previous day:   Total I/O In: 3 [I.V.:3] Out: -    Physical Exam: General: Alert, awake, oriented x3. HEENT: No bruits, no goiter. Heart: Regular rate and rhythm, without murmurs, rubs, gallops. Lungs: Clear to auscultation bilaterally. Abdomen: Soft, nontender, nondistended, positive bowel sounds. Extremities: 1-2+ pitting edema bilaterally. Neuro: Grossly intact, nonfocal.    Lab Results: Basic Metabolic Panel:  Recent Labs  16/10/96 1854 12/02/12 0555  NA 132* 133*  K 3.9 4.2  CL 99 100  CO2 23 26  GLUCOSE 122* 153*  BUN 11 13  CREATININE 0.63 0.66  CALCIUM 8.6 8.6   Liver Function Tests:  Recent Labs  12/01/12 1854  AST 15  ALT 13  ALKPHOS 41  BILITOT 2.1*  PROT 6.1  ALBUMIN 3.1*   CBC:  Recent Labs  12/01/12 1854 12/02/12 0555  WBC 6.9 9.0  HGB 13.1 11.8*  HCT 38.1* 33.6*  MCV 92.9 91.1  PLT 124* 132*   Cardiac Enzymes:  Recent Labs  12/01/12 1900  TROPONINI <0.30   BNP:  Recent Labs  12/01/12 1900  PROBNP 1909.0*   Urinalysis:  Recent Labs  12/01/12 2010  COLORURINE YELLOW  LABSPEC 1.018  PHURINE 5.0  GLUCOSEU NEGATIVE  HGBUR NEGATIVE  BILIRUBINUR NEGATIVE  KETONESUR NEGATIVE  PROTEINUR NEGATIVE  UROBILINOGEN 0.2  NITRITE NEGATIVE  LEUKOCYTESUR NEGATIVE    Studies/Results: Dg Chest 2 View (if Patient Has Fever And/or Copd)  12/01/2012  *RADIOLOGY REPORT*  Clinical  Data: Shortness of breath, cough and fever.  CHEST - 2 VIEW  Comparison: CT and plain film of the chest 03/06/2012.  Findings: The patient has bibasilar airspace disease.  There is cardiomegaly. No pneumothorax or pleural fluid.  IMPRESSION: Bibasilar airspace disease worrisome for pneumonia.   Original Report Authenticated By: Holley Dexter, M.D.     Medications: Scheduled Meds: . atenolol  100 mg Oral Daily  . azithromycin  500 mg Intravenous Q24H  . cefTRIAXone (ROCEPHIN)  IV  1 g Intravenous Q24H  . doxazosin  8 mg Oral QHS  . enoxaparin (LOVENOX) injection  30 mg Subcutaneous Q24H  . lisinopril  20 mg Oral Daily  . lisinopril  40 mg Oral QHS  . predniSONE  2.5 mg Oral Daily  . sodium chloride  3 mL Intravenous Q12H   Continuous Infusions:  PRN Meds:.sodium chloride, albuterol, HYDROcodone-acetaminophen, ondansetron, ondansetron, sodium chloride  Assessment/Plan:  Principal Problem:   CAP (community acquired pneumonia) Active Problems:   Acute respiratory failure   Irregular heart rate   Hyponatremia   Thrombocytopenia   Community-acquired pneumonia -Continue Rocephin/azithromycin. -Follow culture data.  Mild thrombocytopenia -Platelet count 132. -No further acute inpatient workup required. -We'll discontinue Lovenox and place on SCDs for DVT prophylaxis.  Disposition -Await PT/OT evals  given his weakness, but suspect he will be able to discharge home in the next 24-48 hours.  Time spent coordinating care: 35 minutes   LOS: 1 day   [facility] Pager: [phone] 12/02/2012, 12:17 PM

## 2012-12-02 NOTE — Evaluation (Signed)
Occupational Therapy Evaluation and Discharge Patient Details Name: Tracy Brandt MRN: [id number] DOB: February 01, 1930 Today's Date: 12/02/2012 Time: 7829-5621 OT Time Calculation (min): 18 min  OT Assessment / Plan / Recommendation Clinical Impression  This 79 yo male admitted with SOB and found to have PNA presents to acute OT at a Mod I level. Will D/C from acute OT.    OT Assessment  Patient does not need any further OT services    Follow Up Recommendations  No OT follow up       Equipment Recommendations  None recommended by OT          Precautions / Restrictions Precautions Precautions: Fall Restrictions Weight Bearing Restrictions: No       ADL  Eating/Feeding: Simulated;Modified independent Where Assessed - Eating/Feeding: Edge of bed Grooming: Simulated;Modified independent Where Assessed - Grooming: Unsupported sitting Upper Body Bathing: Simulated;Modified independent Where Assessed - Upper Body Bathing: Unsupported sitting Lower Body Bathing: Simulated;Modified independent Where Assessed - Lower Body Bathing: Unsupported sitting Upper Body Dressing: Simulated;Modified independent Where Assessed - Upper Body Dressing: Unsupported sitting Lower Body Dressing: Performed;Modified independent Where Assessed - Lower Body Dressing:  (side sitting on the bed) Toilet Transfer: Simulated;Modified independent Toilet Transfer Method: Sit to Barista:  (sit to stand and stand to sit from raised bed) Toileting - Clothing Manipulation and Hygiene: Simulated;Modified independent Where Assessed - Toileting Clothing Manipulation and Hygiene: Standing Equipment Used: Rolling walker Transfers/Ambulation Related to ADLs: Mod I for all        Visit Information  Last OT Received On: 12/02/12 Assistance Needed: +1    Subjective Data  Subjective: Once I get my stand up I'm fine but, I can't stand long   Prior Functioning     Home Living Lives  With: Alone Available Help at Discharge: Family;Available PRN/intermittently Type of Home: House Home Access: Ramped entrance Home Layout: One level Bathroom Shower/Tub: Engineer, manufacturing systems: Handicapped height Home Adaptive Equipment: Tub transfer bench;Walker - four wheeled;Electric Scooter;Raised toilet seat with rails Additional Comments: Pt uses rolling office chair in kitchen. Prior Function Level of Independence: Independent with assistive device(s) Driving: Yes Vocation: Retired Musician: No difficulties         Vision/Perception Vision - History Baseline Vision: No visual deficits Patient Visual Report: No change from baseline   Cognition  Cognition Overall Cognitive Status: Appears within functional limits for tasks assessed/performed Arousal/Alertness: Awake/alert Orientation Level: Appears intact for tasks assessed Behavior During Session: [facility] for tasks performed    Extremity/Trunk Assessment Right Upper Extremity Assessment RUE ROM/Strength/Tone: WFL for tasks assessed RUE Coordination: WFL - gross/fine motor Left Upper Extremity Assessment LUE ROM/Strength/Tone: WFL for tasks assessed LUE Coordination: WFL - gross/fine motor     Mobility Bed Mobility Bed Mobility: Supine to Sit Supine to Sit: With rails;6: Modified independent (Device/Increase time) Details for Bed Mobility Assistance: Pt doesn't have railing at house but pt stated bed is up high enough to assist him in getting from supine to sit Transfers Transfers: Sit to Stand;Stand to Sit Sit to Stand: 6: Modified independent (Device/Increase time);From elevated surface;With upper extremity assist;From bed Stand to Sit: 6: Modified independent (Device/Increase time);With upper extremity assist;To bed Details for Transfer Assistance: Pt only sits on high surfaces at home.        Balance Dynamic Sitting Balance Dynamic Sitting - Balance Support: No upper extremity  supported Dynamic Sitting - Level of Assistance: 6: Modified independent (Device/Increase time) Dynamic Sitting Balance - Compensations: donned and doffed  socks at EOB    End of Session OT - End of Session Activity Tolerance: Patient tolerated treatment well Patient left: in bed;with family/visitor present;with call bell/phone within reach (sitting on the side of the bed)    Evette Georges [phone] 12/02/2012, 3:09 PM

## 2012-12-02 NOTE — Progress Notes (Signed)
Utilization Review Completed Willi Borowiak J. Mckinzy Fuller, RN, BSN, NCM [phone]  

## 2012-12-03 LAB — CBC
HCT: 32.1 % — ABNORMAL LOW (ref 39.0–52.0)
MCH: 31.9 pg (ref 26.0–34.0)
MCV: 91.5 fL (ref 78.0–100.0)
RDW: 12.5 % (ref 11.5–15.5)
WBC: 9.1 10*3/uL (ref 4.0–10.5)

## 2012-12-03 LAB — URINE CULTURE

## 2012-12-03 LAB — EXPECTORATED SPUTUM ASSESSMENT W GRAM STAIN, RFLX TO RESP C

## 2012-12-03 LAB — BASIC METABOLIC PANEL
CO2: 26 mEq/L (ref 19–32)
Calcium: 8.6 mg/dL (ref 8.4–10.5)
Chloride: 97 mEq/L (ref 96–112)
Creatinine, Ser: 0.71 mg/dL (ref 0.50–1.35)
Glucose, Bld: 93 mg/dL (ref 70–99)

## 2012-12-03 MED ORDER — LEVOFLOXACIN 750 MG PO TABS: 750.0000 mg | ORAL_TABLET | Freq: Every day | ORAL | Status: DC

## 2012-12-03 MED ORDER — ONDANSETRON HCL 4 MG PO TABS: 4.0000 mg | ORAL_TABLET | Freq: Four times a day (QID) | ORAL | Status: AC | PRN

## 2012-12-03 NOTE — Progress Notes (Signed)
Physical Therapy Treatment Patient Details Name: Tracy Brandt MRN: [id number] DOB: Jun 22, 1930 Today's Date: 12/03/2012 Time: 5366-4403 PT Time Calculation (min): 26 min  PT Assessment / Plan / Recommendation Comments on Treatment Session  Pt adm with PNA.  Made good progress and dc'd home today.    Follow Up Recommendations  Home health PT     Does the patient have the potential to tolerate intense rehabilitation     Barriers to Discharge        Equipment Recommendations  None recommended by PT    Recommendations for Other Services    Frequency     Plan All goals met and education completed, patient dischaged from PT services    Precautions / Restrictions Precautions Precautions: Fall   Pertinent Vitals/Pain N/A    Mobility  Bed Mobility Supine to Sit: With rails;6: Modified independent (Device/Increase time) Transfers Sit to Stand: 6: Modified independent (Device/Increase time);From elevated surface;With upper extremity assist;From bed Stand to Sit: 6: Modified independent (Device/Increase time);With upper extremity assist;To bed Details for Transfer Assistance: Pt only sits on high surfaces at home. Ambulation/Gait Ambulation/Gait Assistance: 6: Modified independent (Device/Increase time) Ambulation Distance (Feet): 50 Feet Assistive device: Rolling walker Gait Pattern: Step-through pattern;Decreased stride length    Exercises     PT Diagnosis:    PT Problem List:   PT Treatment Interventions:     PT Goals Acute Rehab PT Goals PT Goal: Supine/Side to Sit - Progress: Met PT Goal: Sit to Supine/Side - Progress: Met PT Goal: Sit to Stand - Progress: Met PT Goal: Stand to Sit - Progress: Met PT Goal: Ambulate - Progress: Met  Visit Information  Last PT Received On: 12/03/12 Assistance Needed: +1    Subjective Data  Subjective: Pt states he is ready to get up.   Cognition  Cognition Overall Cognitive Status: Appears within functional limits for tasks  assessed/performed Arousal/Alertness: Awake/alert Orientation Level: Appears intact for tasks assessed Behavior During Session: [facility] for tasks performed    Balance  Static Standing Balance Static Standing - Balance Support: During functional activity;Left upper extremity supported Static Standing - Level of Assistance: 6: Modified independent (Device/Increase time) Static Standing - Comment/# of Minutes: Pt stood 7-8 minutes to wash up and for bed to be changed  End of Session PT - End of Session Equipment Utilized During Treatment: Gait belt Activity Tolerance: Patient tolerated treatment well Patient left: in bed;with call bell/phone within reach (sitting EOB)   GP     Eustacia Urbanek 12/03/2012, 12:09 PM  [facility] PT [phone]

## 2012-12-03 NOTE — Progress Notes (Signed)
DC IV, DC Tele, DC Home. Discharge instructions and home medications discussed with patient and patient's family member. Patient and family denied any questions or concerns at this time. Patient leaving unit via wheelchair and appears in no acute distress. 

## 2012-12-03 NOTE — Discharge Summary (Signed)
Physician Discharge Summary  Patient ID: Tracy Brandt MRN: [id number] DOB/AGE: [birth year] 79 y.o.  Admit date: 12/01/2012 Discharge date: 12/03/2012  Primary Care Physician:  Nadean Corwin, MD   Discharge Diagnoses:    Principal Problem:   CAP (community acquired pneumonia) Active Problems:   Acute respiratory failure   Irregular heart rate   Hyponatremia   Thrombocytopenia      Medication List    TAKE these medications       atenolol 100 MG tablet  Commonly known as:  TENORMIN  Take 100 mg by mouth daily.     atorvastatin 40 MG tablet  Commonly known as:  LIPITOR  Take 40 mg by mouth daily.     doxazosin 8 MG tablet  Commonly known as:  CARDURA  Take 8 mg by mouth at bedtime.     fish oil-omega-3 fatty acids 1000 MG capsule  Take 1 g by mouth daily.     HYDROcodone-acetaminophen 5-325 MG per tablet  Commonly known as:  NORCO/VICODIN  Take 0.5-1 tablets by mouth every 6 (six) hours as needed for pain.     levofloxacin 750 MG tablet  Commonly known as:  LEVAQUIN  Take 1 tablet (750 mg total) by mouth daily.     lisinopril 40 MG tablet  Commonly known as:  PRINIVIL,ZESTRIL  Take 20-40 mg by mouth 2 (two) times daily. Take 1/2 a tablet in the morning and 1 tablet in the evening     Magnesium 250 MG Tabs  Take 250 mg by mouth daily.     ondansetron 4 MG tablet  Commonly known as:  ZOFRAN  Take 1 tablet (4 mg total) by mouth every 6 (six) hours as needed.     predniSONE 10 MG tablet  Commonly known as:  DELTASONE  Take 2.5 mg by mouth daily.     VITAMIN D PO  Take 1 tablet by mouth daily.         Disposition and Follow-up:  Patient will be discharged home today in stable and improved condition. Have advised to followup with primary care provider in 2 weeks. Would recommend a chest x-ray at 6 weeks to ensure complete resolution of his pneumonia.  Consults:  None   Significant Diagnostic Studies:  Dg Chest 2 View (if Patient Has Fever  And/or Copd)  12/01/2012  *RADIOLOGY REPORT*  Clinical Data: Shortness of breath, cough and fever.  CHEST - 2 VIEW  Comparison: CT and plain film of the chest 03/06/2012.  Findings: The patient has bibasilar airspace disease.  There is cardiomegaly. No pneumothorax or pleural fluid.  IMPRESSION: Bibasilar airspace disease worrisome for pneumonia.   Original Report Authenticated By: Holley Dexter, M.D.     Brief H and P: For complete details please refer to admission H and P, but in brief 79 yo male who presented to [facility] ED with main concern of progressively worsening shortness of breath that started 2-3 prior to admission, associated with fevers, chills, productive cough of yellow sputum, malaise. Pt denies similar events in the past, no recent sick contacts or exposures, no recent hospitalizations. Pt denies chest pain other that when discomfort is present with coughing, pt also denies palpitations, dizziness, specific focal neurological concerns, no headaches, no visual changes, no lower extremity swelling.  In ED, pt initially with increased work of breathing. He has responded well to albuterol nebulizer and oxygen via [place]. Also one dose of Solumedrol was given. CXR demonstrated PNA. TRH as ked to admit for management  of presumptive CAP.     Hospital Course:  Principal Problem:   CAP (community acquired pneumonia) Active Problems:   Acute respiratory failure   Irregular heart rate   Hyponatremia   Thrombocytopenia   Community acquired pneumonia -All culture data has been negative. -Was maintained on Rocephin/azithromycin while in the hospital. -We will transition to Levaquin for 5 more days at time of discharge.  Rest of chronic medical conditions have been stable this hospitalization.   Time spent on Discharge: Greater than 30 minutes  Signed: Chaya Jan [facility] Pager: [phone] 12/03/2012, 2:51 PM

## 2012-12-04 NOTE — ED Provider Notes (Signed)
I saw and evaluated the patient, reviewed the resident's note and I agree with the findings and plan.  79 year old male with fevers, right nurse and cough. Clinical picture consistent with pneumonia. Treatment initiated for two-car pneumonia. Blood pressure soft on arrival blood but improved with a small bolus of IV fluids. Admission for further treatment and evaluation.  Raeford Razor, MD 12/04/12 [phone]

## 2012-12-08 LAB — CULTURE, BLOOD (ROUTINE X 2)
Culture: NO GROWTH
Culture: NO GROWTH

## 2012-12-09 DIAGNOSIS — M79609 Pain in unspecified limb: Secondary | ICD-10-CM | POA: Diagnosis not present

## 2012-12-09 DIAGNOSIS — R269 Unspecified abnormalities of gait and mobility: Secondary | ICD-10-CM | POA: Diagnosis not present

## 2012-12-09 DIAGNOSIS — I10 Essential (primary) hypertension: Secondary | ICD-10-CM | POA: Diagnosis not present

## 2012-12-09 DIAGNOSIS — E669 Obesity, unspecified: Secondary | ICD-10-CM | POA: Diagnosis not present

## 2012-12-09 DIAGNOSIS — Z602 Problems related to living alone: Secondary | ICD-10-CM | POA: Diagnosis not present

## 2012-12-09 DIAGNOSIS — J189 Pneumonia, unspecified organism: Secondary | ICD-10-CM | POA: Diagnosis not present

## 2012-12-09 DIAGNOSIS — R5381 Other malaise: Secondary | ICD-10-CM | POA: Diagnosis not present

## 2012-12-19 DIAGNOSIS — Z79899 Other long term (current) drug therapy: Secondary | ICD-10-CM | POA: Diagnosis not present

## 2012-12-19 DIAGNOSIS — J449 Chronic obstructive pulmonary disease, unspecified: Secondary | ICD-10-CM | POA: Diagnosis not present

## 2012-12-19 DIAGNOSIS — I10 Essential (primary) hypertension: Secondary | ICD-10-CM | POA: Diagnosis not present

## 2012-12-20 DIAGNOSIS — R269 Unspecified abnormalities of gait and mobility: Secondary | ICD-10-CM | POA: Diagnosis not present

## 2012-12-20 DIAGNOSIS — M79609 Pain in unspecified limb: Secondary | ICD-10-CM | POA: Diagnosis not present

## 2012-12-20 DIAGNOSIS — R5381 Other malaise: Secondary | ICD-10-CM | POA: Diagnosis not present

## 2013-02-25 DIAGNOSIS — E559 Vitamin D deficiency, unspecified: Secondary | ICD-10-CM | POA: Diagnosis not present

## 2013-02-25 DIAGNOSIS — R7309 Other abnormal glucose: Secondary | ICD-10-CM | POA: Diagnosis not present

## 2013-02-25 DIAGNOSIS — D649 Anemia, unspecified: Secondary | ICD-10-CM | POA: Diagnosis not present

## 2013-02-25 DIAGNOSIS — I10 Essential (primary) hypertension: Secondary | ICD-10-CM | POA: Diagnosis not present

## 2013-02-25 DIAGNOSIS — M79609 Pain in unspecified limb: Secondary | ICD-10-CM | POA: Diagnosis not present

## 2013-02-25 DIAGNOSIS — E782 Mixed hyperlipidemia: Secondary | ICD-10-CM | POA: Diagnosis not present

## 2013-02-25 DIAGNOSIS — E538 Deficiency of other specified B group vitamins: Secondary | ICD-10-CM | POA: Diagnosis not present

## 2013-02-25 DIAGNOSIS — Z79899 Other long term (current) drug therapy: Secondary | ICD-10-CM | POA: Diagnosis not present

## 2013-05-28 DIAGNOSIS — Z79899 Other long term (current) drug therapy: Secondary | ICD-10-CM | POA: Diagnosis not present

## 2013-05-28 DIAGNOSIS — E559 Vitamin D deficiency, unspecified: Secondary | ICD-10-CM | POA: Diagnosis not present

## 2013-05-28 DIAGNOSIS — Z23 Encounter for immunization: Secondary | ICD-10-CM | POA: Diagnosis not present

## 2013-05-28 DIAGNOSIS — I10 Essential (primary) hypertension: Secondary | ICD-10-CM | POA: Diagnosis not present

## 2013-05-28 DIAGNOSIS — E782 Mixed hyperlipidemia: Secondary | ICD-10-CM | POA: Diagnosis not present

## 2013-05-28 DIAGNOSIS — R7309 Other abnormal glucose: Secondary | ICD-10-CM | POA: Diagnosis not present

## 2013-06-30 ENCOUNTER — Ambulatory Visit: Payer: Self-pay | Admitting: Internal Medicine

## 2013-07-24 ENCOUNTER — Ambulatory Visit: Payer: Self-pay | Admitting: Internal Medicine

## 2013-09-05 ENCOUNTER — Ambulatory Visit: Payer: Self-pay | Admitting: Physician Assistant

## 2013-09-08 ENCOUNTER — Ambulatory Visit: Payer: Non-veteran care | Attending: Physician Assistant | Admitting: Physical Therapy

## 2013-09-08 DIAGNOSIS — IMO0001 Reserved for inherently not codable concepts without codable children: Secondary | ICD-10-CM | POA: Insufficient documentation

## 2013-09-08 DIAGNOSIS — M2560 Stiffness of unspecified joint, not elsewhere classified: Secondary | ICD-10-CM | POA: Insufficient documentation

## 2013-09-08 DIAGNOSIS — R262 Difficulty in walking, not elsewhere classified: Secondary | ICD-10-CM | POA: Insufficient documentation

## 2013-09-08 DIAGNOSIS — R5381 Other malaise: Secondary | ICD-10-CM | POA: Insufficient documentation

## 2013-09-10 ENCOUNTER — Ambulatory Visit: Payer: Non-veteran care | Admitting: Physical Therapy

## 2013-09-16 ENCOUNTER — Ambulatory Visit: Payer: Non-veteran care | Admitting: Physical Therapy

## 2013-09-18 ENCOUNTER — Ambulatory Visit: Payer: Non-veteran care | Admitting: Physical Therapy

## 2013-09-23 ENCOUNTER — Ambulatory Visit: Payer: Non-veteran care | Attending: Physician Assistant | Admitting: Physical Therapy

## 2013-09-23 DIAGNOSIS — M2560 Stiffness of unspecified joint, not elsewhere classified: Secondary | ICD-10-CM | POA: Insufficient documentation

## 2013-09-23 DIAGNOSIS — R5381 Other malaise: Secondary | ICD-10-CM | POA: Insufficient documentation

## 2013-09-23 DIAGNOSIS — R262 Difficulty in walking, not elsewhere classified: Secondary | ICD-10-CM | POA: Insufficient documentation

## 2013-09-23 DIAGNOSIS — IMO0001 Reserved for inherently not codable concepts without codable children: Secondary | ICD-10-CM | POA: Insufficient documentation

## 2013-09-25 ENCOUNTER — Ambulatory Visit: Payer: Non-veteran care | Admitting: Physical Therapy

## 2013-09-30 ENCOUNTER — Ambulatory Visit: Payer: Non-veteran care | Admitting: Physical Therapy

## 2013-10-02 ENCOUNTER — Ambulatory Visit: Payer: Non-veteran care | Admitting: Physical Therapy

## 2013-10-07 ENCOUNTER — Ambulatory Visit: Payer: Non-veteran care | Admitting: Physical Therapy

## 2013-10-09 ENCOUNTER — Ambulatory Visit: Payer: Non-veteran care | Admitting: Physical Therapy

## 2013-10-20 ENCOUNTER — Ambulatory Visit: Payer: Non-veteran care | Admitting: Physical Therapy

## 2013-10-21 ENCOUNTER — Ambulatory Visit: Payer: Non-veteran care | Admitting: Physical Therapy

## 2013-10-23 DIAGNOSIS — L570 Actinic keratosis: Secondary | ICD-10-CM | POA: Diagnosis not present

## 2013-10-23 IMAGING — CT CT ABD-PELV W/ CM
4 of 5 series · 13 of 46 positions shown, 19 images · IV contrast (100ml omni 300)
Comparison: None.

CT CHEST

CLINICAL DATA: Trauma.  Chest pain, back pain, headache.

CT CHEST, ABDOMEN AND PELVIS WITH CONTRAST
TECHNIQUE: Multidetector CT imaging of the chest, abdomen and
pelvis was performed following the standard protocol during bolus
administration of intravenous contrast.
Contrast: 100mL OMNIPAQUE IOHEXOL 300 MG/ML  SOLN

[Series 2: chest/abd/pelvis · axial · 0.98mm/px · z∈[-631,-221]mm · 6 of 141 slices shown]
[im 17/141  soft-tissue]
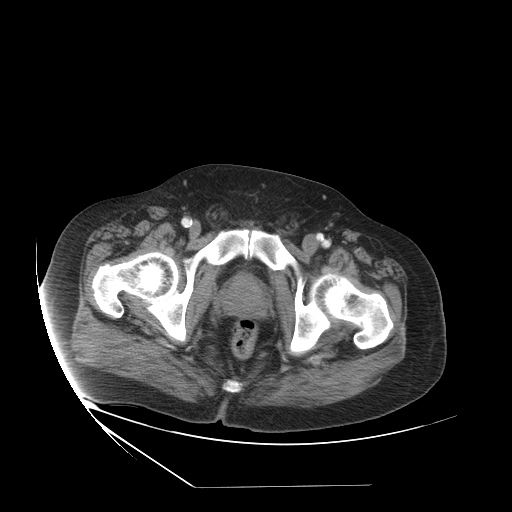
[im 33/141  soft-tissue]
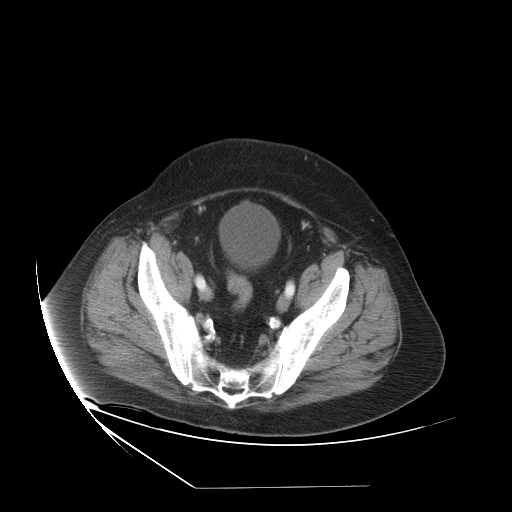
[im 50/141  soft-tissue]
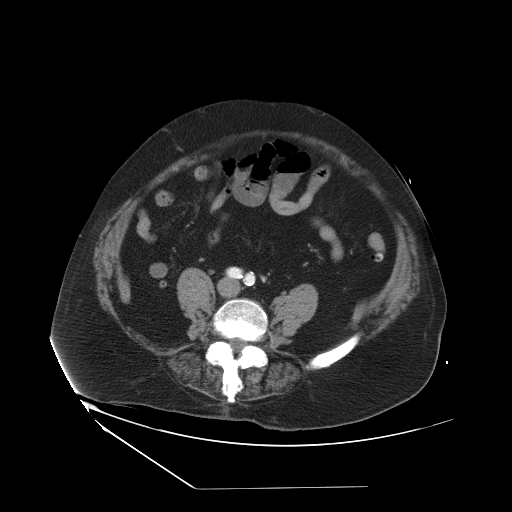
[im 66/141  soft-tissue]
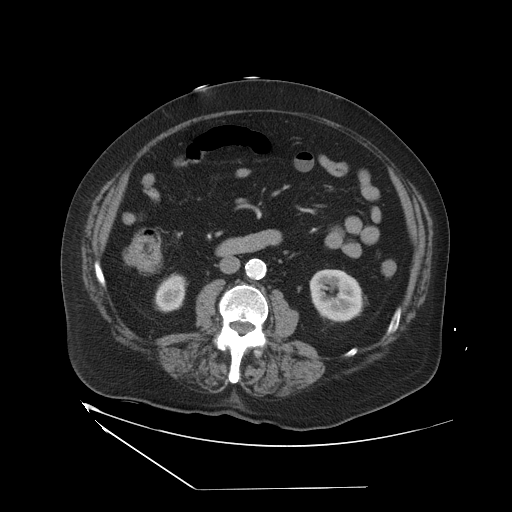
[im 83/141  soft-tissue]
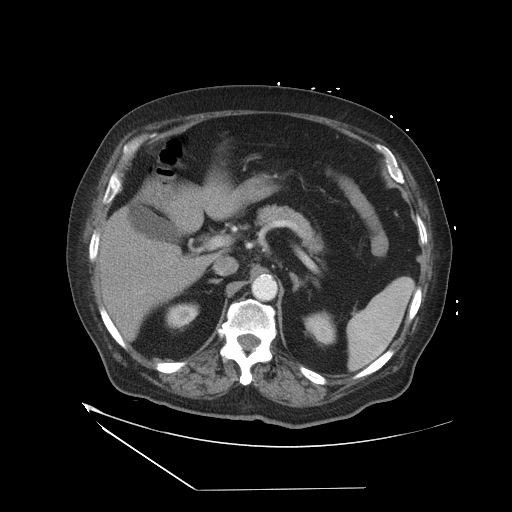
[im 99/141  soft-tissue]
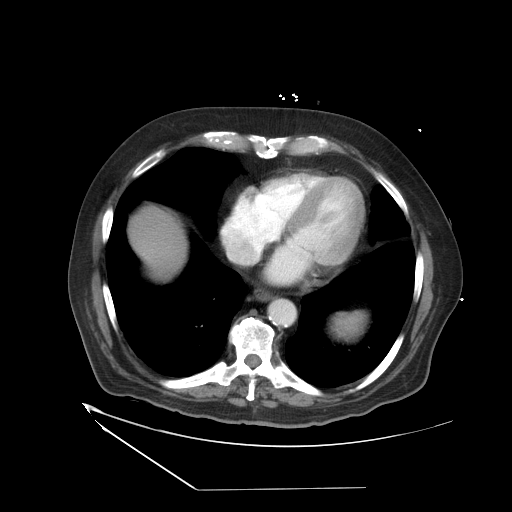

[Series 5: renal delays · axial · 0.98mm/px · z∈[-400,-315]mm · 3 of 35 slices shown, 7 images]
[im 9/35  soft-tissue]
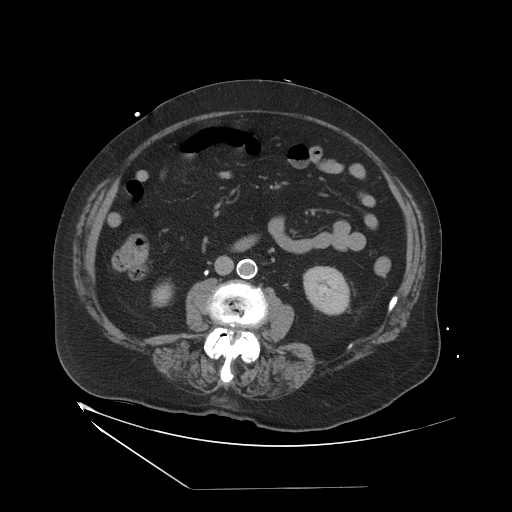
[im 9/35  lung]
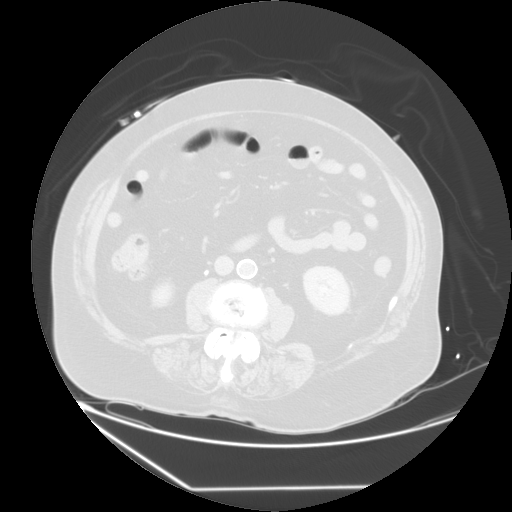
[im 9/35  bone]
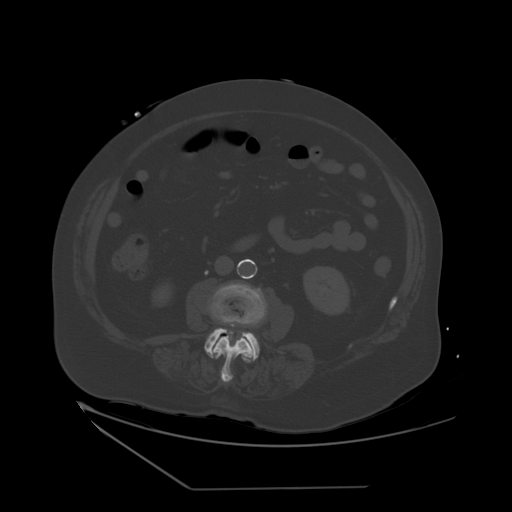
[im 18/35  soft-tissue]
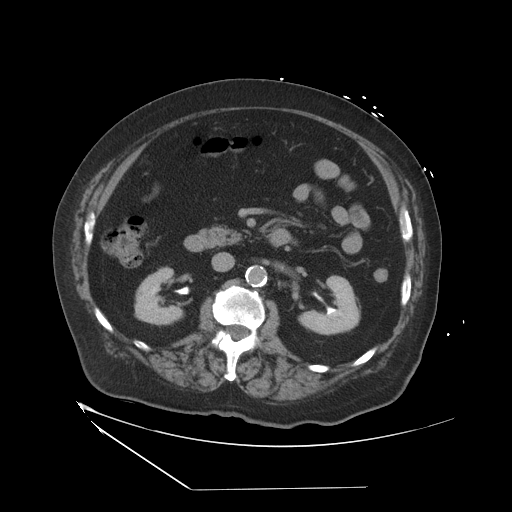
[im 18/35  lung]
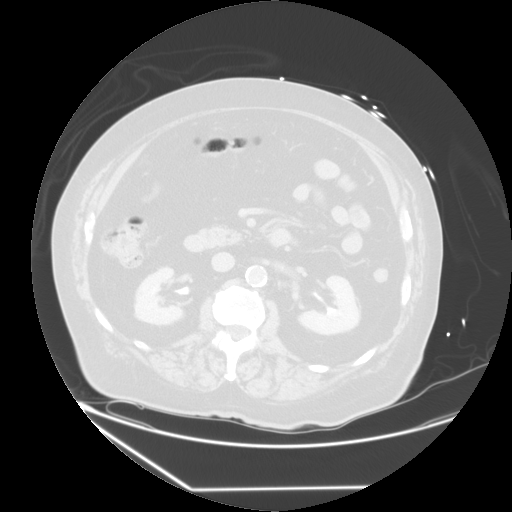
[im 26/35  soft-tissue]
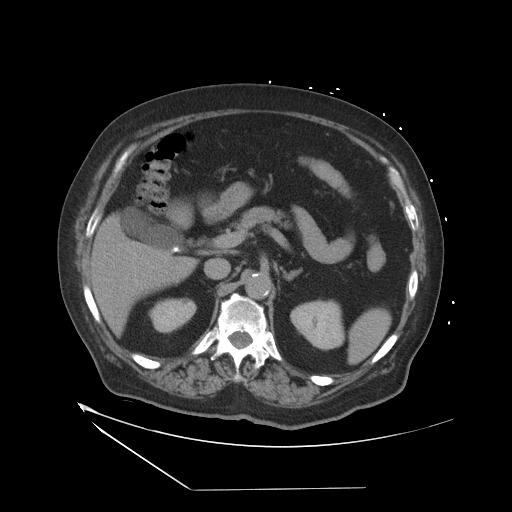
[im 26/35  lung]
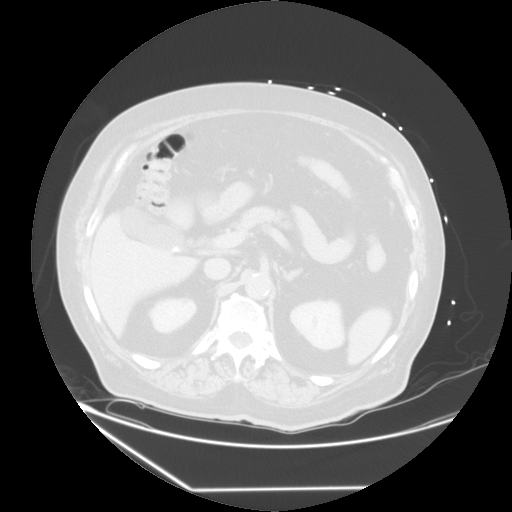

[Series 400: sagittal · sagittal · 1.40mm/px · 1 of 134 slices shown, 2 images]
[im 45/134  soft-tissue]
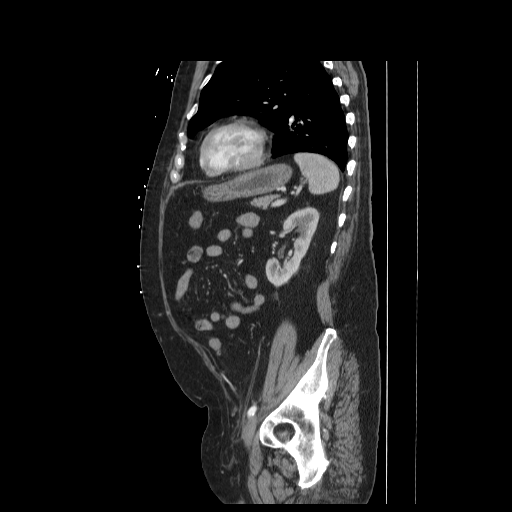
[im 45/134  bone]
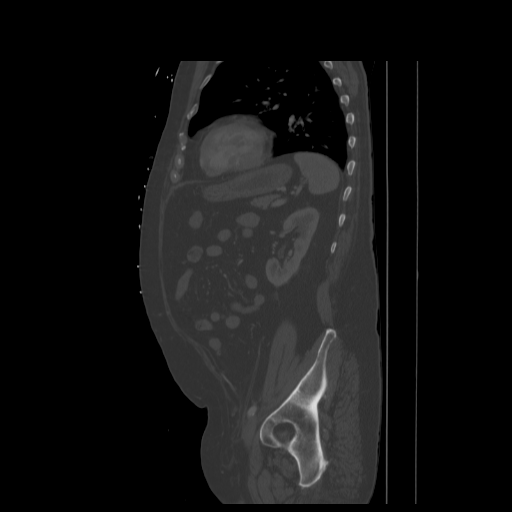

[Series 401: coronal · coronal · 1.40mm/px · 3 of 119 slices shown, 4 images]
[im 40/119  soft-tissue]
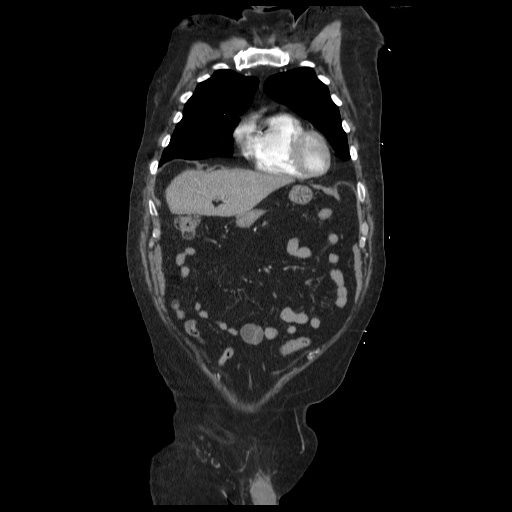
[im 53/119  soft-tissue]
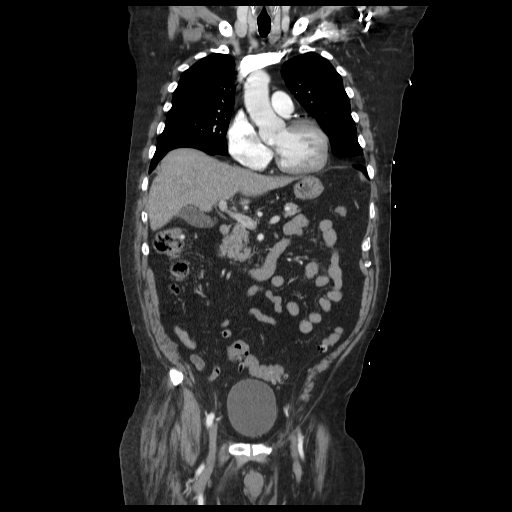
[im 53/119  bone]
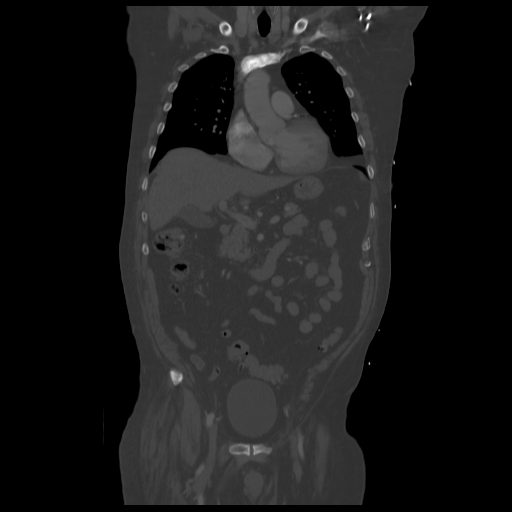
[im 66/119  soft-tissue]
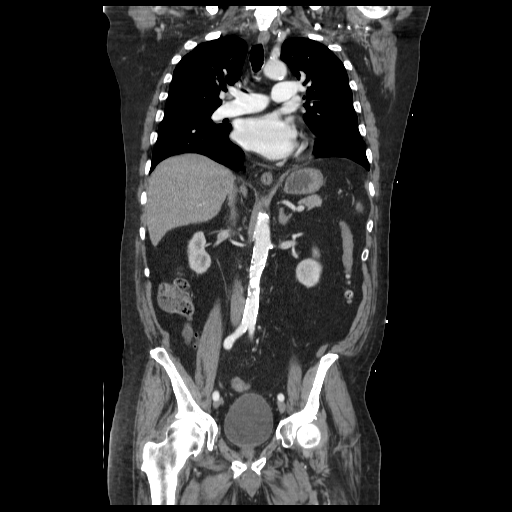

[13 of 46 positions shown; findings below may reference images not displayed]

FINDINGS: Lungs are clear.  No focal airspace opacities or
suspicious nodules.  No effusions.  Heart is upper limits normal in
size.  Aorta is normal caliber.  No evidence of mediastinal
hematoma.  Coronary artery calcifications are present. No
mediastinal, hilar, or axillary adenopathy.  Visualized thyroid and
chest wall soft tissues unremarkable.  No acute bony abnormality.
No pneumothorax.  Degenerative changes in the thoracic spine.
IMPRESSION: No acute findings in the chest.

Coronary artery disease.

CT ABDOMEN AND PELVIS
FINDINGS: Cholelithiasis.  Liver, spleen, pancreas, adrenals and
kidneys are normal.  Descending and sigmoid diverticulosis.  No
active diverticulitis.  Stomach and small bowel unremarkable rest.
No free fluid, free air or adenopathy.  Urinary bladder and
prostate grossly unremarkable.

No acute bony abnormality.

Appendix is visualized and is normal.
IMPRESSION: No acute findings in the abdomen or pelvis.

Descending and sigmoid diverticulosis.

Cholelithiasis.

## 2013-11-29 DIAGNOSIS — E559 Vitamin D deficiency, unspecified: Secondary | ICD-10-CM | POA: Insufficient documentation

## 2013-11-29 DIAGNOSIS — R7309 Other abnormal glucose: Secondary | ICD-10-CM | POA: Insufficient documentation

## 2013-11-29 DIAGNOSIS — I10 Essential (primary) hypertension: Secondary | ICD-10-CM | POA: Insufficient documentation

## 2013-11-29 DIAGNOSIS — M109 Gout, unspecified: Secondary | ICD-10-CM | POA: Insufficient documentation

## 2013-12-01 ENCOUNTER — Encounter: Payer: Self-pay | Admitting: Internal Medicine

## 2013-12-01 ENCOUNTER — Ambulatory Visit (INDEPENDENT_AMBULATORY_CARE_PROVIDER_SITE_OTHER): Payer: Medicare Other | Admitting: Internal Medicine

## 2013-12-01 VITALS — BP 166/74 | HR 64 | Temp 98.2°F | Resp 16 | Ht 70.5 in | Wt 218.4 lb

## 2013-12-01 DIAGNOSIS — Z125 Encounter for screening for malignant neoplasm of prostate: Secondary | ICD-10-CM | POA: Diagnosis not present

## 2013-12-01 DIAGNOSIS — R7309 Other abnormal glucose: Secondary | ICD-10-CM | POA: Diagnosis not present

## 2013-12-01 DIAGNOSIS — M159 Polyosteoarthritis, unspecified: Secondary | ICD-10-CM

## 2013-12-01 DIAGNOSIS — E559 Vitamin D deficiency, unspecified: Secondary | ICD-10-CM | POA: Diagnosis not present

## 2013-12-01 DIAGNOSIS — Z79899 Other long term (current) drug therapy: Secondary | ICD-10-CM | POA: Diagnosis not present

## 2013-12-01 DIAGNOSIS — I10 Essential (primary) hypertension: Secondary | ICD-10-CM

## 2013-12-01 DIAGNOSIS — E785 Hyperlipidemia, unspecified: Secondary | ICD-10-CM | POA: Diagnosis not present

## 2013-12-01 DIAGNOSIS — M109 Gout, unspecified: Secondary | ICD-10-CM

## 2013-12-01 DIAGNOSIS — Z1331 Encounter for screening for depression: Secondary | ICD-10-CM

## 2013-12-01 DIAGNOSIS — R7303 Prediabetes: Secondary | ICD-10-CM

## 2013-12-01 DIAGNOSIS — Z1212 Encounter for screening for malignant neoplasm of rectum: Secondary | ICD-10-CM

## 2013-12-01 LAB — BASIC METABOLIC PANEL WITH GFR
BUN: 9 mg/dL (ref 6–23)
CALCIUM: 9.6 mg/dL (ref 8.4–10.5)
CO2: 26 mEq/L (ref 19–32)
CREATININE: 0.54 mg/dL (ref 0.50–1.35)
Chloride: 93 mEq/L — ABNORMAL LOW (ref 96–112)
GFR, Est Non African American: >89 mL/min
Glucose, Bld: 129 mg/dL — ABNORMAL HIGH (ref 70–99)
Potassium: 4 mEq/L (ref 3.5–5.3)
SODIUM: 132 meq/L — AB (ref 135–145)

## 2013-12-01 LAB — CBC WITH DIFFERENTIAL/PLATELET
BASOS PCT: 0 % (ref 0–1)
Basophils Absolute: 0 10*3/uL (ref 0.0–0.1)
EOS ABS: 0 10*3/uL (ref 0.0–0.7)
EOS PCT: 1 % (ref 0–5)
HCT: 41.8 % (ref 39.0–52.0)
Hemoglobin: 14.1 g/dL (ref 13.0–17.0)
Lymphocytes Relative: 35 % (ref 12–46)
Lymphs Abs: 1.7 10*3/uL (ref 0.7–4.0)
MCH: 31 pg (ref 26.0–34.0)
MCHC: 33.7 g/dL (ref 30.0–36.0)
MCV: 91.9 fL (ref 78.0–100.0)
Monocytes Absolute: 0.3 10*3/uL (ref 0.1–1.0)
Monocytes Relative: 6 % (ref 3–12)
NEUTROS PCT: 58 % (ref 43–77)
Neutro Abs: 2.8 10*3/uL (ref 1.7–7.7)
PLATELETS: 199 10*3/uL (ref 150–400)
RBC: 4.55 MIL/uL (ref 4.22–5.81)
RDW: 13 % (ref 11.5–15.5)
WBC: 4.9 10*3/uL (ref 4.0–10.5)

## 2013-12-01 LAB — HEPATIC FUNCTION PANEL
ALBUMIN: 4.2 g/dL (ref 3.5–5.2)
ALT: 17 U/L (ref 0–53)
AST: 20 U/L (ref 0–37)
Alkaline Phosphatase: 51 U/L (ref 39–117)
BILIRUBIN DIRECT: 0.2 mg/dL (ref 0.0–0.3)
BILIRUBIN TOTAL: 1.2 mg/dL (ref 0.2–1.2)
Indirect Bilirubin: 1 mg/dL (ref 0.2–1.2)
Total Protein: 6.8 g/dL (ref 6.0–8.3)

## 2013-12-01 LAB — LIPID PANEL
CHOL/HDL RATIO: 4.2 ratio
CHOLESTEROL: 174 mg/dL (ref 0–200)
HDL: 41 mg/dL (ref >39)
LDL Cholesterol: 102 mg/dL — ABNORMAL HIGH (ref 0–99)
Triglycerides: 155 mg/dL — ABNORMAL HIGH (ref <150)
VLDL: 31 mg/dL (ref 0–40)

## 2013-12-01 LAB — MAGNESIUM: MAGNESIUM: 1.6 mg/dL (ref 1.5–2.5)

## 2013-12-01 LAB — HEMOGLOBIN A1C
HEMOGLOBIN A1C: 5.8 % — AB (ref <5.7)
Mean Plasma Glucose: 120 mg/dL — ABNORMAL HIGH (ref <117)

## 2013-12-01 LAB — URIC ACID: URIC ACID, SERUM: 7.2 mg/dL (ref 4.0–7.8)

## 2013-12-01 NOTE — Progress Notes (Signed)
Patient ID: Tracy Brandt, male   DOB: [birth year], 80 y.o.   MRN: [id number]   Annual Screening Comprehensive Examination  This very nice 80 y.o.  WWM presents for complete physical.  Patient has been followed for HTN, Prediabetes, Hyperlipidemia, and Vitamin D Deficiency.   HTN predates since 52. Patient's BP has been controlled at home.Today's BP is 166/74 mmHg. Patient denies any cardiac symptoms as chest pain, palpitations, shortness of breath, dizziness or ankle swelling.   Patient's hyperlipidemia is controlled with diet and medications. Patient denies myalgias or other medication SE's. Last cholesterol last visit was  176, triglycerides 171, HDL 45 and LDL 97 in Oct 2014 - at goal.     Patient has prediabetes with A1c 6.0% in 2011 5.8% in Oct 2014. Patient denies reactive hypoglycemic symptoms, visual blurring, diabetic polys, or paresthesias.    Patient does have a problem with Peripheral neuropathy and balance disorder with a negative W/U at the [facility]. He had a negative CTs Head in in 2011 and neg PNCV's and EMG as well as neg studies for myasthenia gravis. [facility] and also remodeled his home bathroom to make it more safety compliant. At home he use a walker. He relates that he has had several falls over the last year w/o major injury.   Finally, patient has history of Vitamin D Deficiency 36 in 2008 with last vitamin D 67 in Oct 2014.    Medication Sig  . atenolol (TENORMIN) 100 MG tablet Take 100 mg by mouth daily.  Marland Kitchen atorvastatin (LIPITOR) 40 MG tablet Take 40 mg by mouth daily.    . Cholecalciferol (VITAMIN D PO) Take 1 tablet by mouth daily.  Marland Kitchen doxazosin (CARDURA) 8 MG tablet Take 8 mg by mouth at bedtime.  . fish oil-omega-3 fatty acids 1000 MG capsule Take 1 g by mouth daily.  Marland Kitchen HYDROcodone-acetaminophen  5-325 MG per  Take 0.5-1 tablets by mouth every 6 (six) hours as needed for pain.  Marland Kitchen lisinopril  (PRINIVIL,ZESTRIL) 40 MG tablet Take 20-40 mg  2  times daily. Take 1/2 a tab qam & 1 tab qpm  . Magnesium 250 MG TABS Take 250 mg by mouth daily.  . ondansetron (ZOFRAN) 4 MG tablet Take 1 tablet (4 mg total) by mouth every 6 (six) hours as needed.   No Known Allergies  Past Medical History  Diagnosis Date  . Hypertension   . Hyperlipidemia   . Prediabetes   . Arthritis   . GERD (gastroesophageal reflux disease)   . BPH (benign prostatic hyperplasia)   . Vitamin D deficiency   . Gout     Past Surgical History  Procedure Laterality Date  . Back surgery    . Leg surgery    . Hemorrhoid surgery    . Eye surgery      Family History  Problem Relation Age of Onset  . Heart disease Mother   . Hypertension Mother   . Heart disease Father    History   Social History  . Marital Status: Widowed 2010 after 40 years marriage.    Spouse Name: N/A    Number of Children: N/A  . Years of Education: N/A   Occupational History  . 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   Social History Main Topics  . Smoking status: Never Smoker   . Smokeless tobacco: Not on file  . Alcohol Use: No  . Drug Use: No  .  Sexual Activity: Inactive     ROS Constitutional: Denies fever, chills, weight loss/gain, headaches, insomnia, fatigue, night sweats, and change in appetite. Eyes: Denies redness, blurred vision, diplopia, discharge, itchy, watery eyes.  ENT: Denies discharge, congestion, post nasal drip, epistaxis, sore throat, earache, hearing loss, dental pain, Tinnitus, Vertigo, Sinus pain, snoring.  Cardio: Denies chest pain, palpitations, irregular heartbeat, syncope, dyspnea, diaphoresis, orthopnea, PND, claudication, edema Respiratory: denies cough, dyspnea, DOE, pleurisy, hoarseness, laryngitis, wheezing.  Gastrointestinal: Denies dysphagia, heartburn, reflux, water brash, pain, cramps, nausea, vomiting, bloating, diarrhea, constipation, hematemesis, melena, hematochezia, jaundice,  hemorrhoids Genitourinary: Denies dysuria, frequency, urgency, nocturia, hesitancy, discharge, hematuria, flank pain Musculoskeletal: Denies arthralgia, myalgia, stiffness, Jt. Swelling, pain, limp, and strain/sprain. Skin: Denies puritis, rash, hives, warts, acne, eczema, changing in skin lesion Neuro: No weakness, tremor, incoordination, spasms, paresthesia, pain Psychiatric: Denies confusion, memory loss, sensory loss Endocrine: Denies change in weight, skin, hair change, nocturia, and paresthesia, diabetic polys, visual blurring, hyper / hypo glycemic episodes.  Heme/Lymph: No excessive bleeding, bruising, or elarged lymph nodes.  Physical Exam    BP 166/74  Pulse 64  Temp 98.2 F   Resp 16  Ht 5' 10.5"   Wt 218 lb 6.4 oz   BMI 30.88 kg/m2  General Appearance: Well nourished, in no apparent distress. Eyes: PERRLA, EOMs, conjunctiva no swelling or erythema, normal fundi and vessels. Sinuses: No frontal/maxillary tenderness ENT/Mouth: EACs patent / TMs  nl. Nares clear without erythema, swelling, mucoid exudates. Oral hygiene is good. No erythema, swelling, or exudate. Tongue normal, non-obstructing. Tonsils not swollen or erythematous. Hearing normal.  Neck: Supple, thyroid normal. No bruits, nodes or JVD. Respiratory: Respiratory effort normal.  BS equal and clear bilateral without rales, rhonci, wheezing or stridor. Cardio: Heart sounds are normal with regular rate and rhythm and no murmurs, rubs or gallops. Peripheral pulses are normal and equal bilaterally without edema. No aortic or femoral bruits. Chest: symmetric with normal excursions and percussion.  Abdomen: Flat, soft, with bowl sounds. Nontender, no guarding, rebound, hernias, masses, or organomegaly.  Lymphatics: Non tender without lymphadenopathy.  Genitourinary: Deferred due to age > 25 yo. Musculoskeletal: Full ROM all peripheral extremities, joint stability, 5/5 strength, and broad-based gait. Using an Doctor, hospital today. Skin: Warm and dry without rashes, lesions, cyanosis, clubbing or  ecchymosis.  Neuro: Cranial nerves intact, KJ's & AJ's absent bilat with decreased sensory in a stocking distribution.. Normal muscle tone, no cerebellar symptoms. Sensation intact.  Pysch: Awake and oriented X 3, normal affect, insight and judgment appropriate.   Assessment and Plan  1. Annual Screening Examination 2. Hypertension  3. Hyperlipidemia 4. Pre Diabetes 5. Vitamin D Deficiency 6. GERD 7. Gout 8. BPH 9. Peripheral Neuropathy 110. Obesity (BMI 31)  Continue prudent diet as discussed, weight control, BP monitoring, regular exercise, and medications as discussed.  Discussed med effects and SE's. Routine screening labs and tests as requested with regular follow-up as recommended.

## 2013-12-01 NOTE — Patient Instructions (Signed)

## 2013-12-02 LAB — URINALYSIS, MICROSCOPIC ONLY
BACTERIA UA: NONE SEEN
Casts: NONE SEEN
Crystals: NONE SEEN
SQUAMOUS EPITHELIAL / LPF: NONE SEEN

## 2013-12-02 LAB — MICROALBUMIN / CREATININE URINE RATIO
CREATININE, URINE: 115.9 mg/dL
MICROALB UR: 2.95 mg/dL — AB (ref 0.00–1.89)
MICROALB/CREAT RATIO: 25.5 mg/g (ref 0.0–30.0)

## 2013-12-02 LAB — TSH: TSH: 0.852 u[IU]/mL (ref 0.350–4.500)

## 2013-12-02 LAB — INSULIN, FASTING: INSULIN FASTING, SERUM: 41 u[IU]/mL — AB (ref 3–28)

## 2013-12-02 LAB — VITAMIN D 25 HYDROXY (VIT D DEFICIENCY, FRACTURES): VIT D 25 HYDROXY: 65 ng/mL (ref 30–89)

## 2013-12-02 LAB — PSA: PSA: 1.94 ng/mL (ref <=4.00)

## 2013-12-15 ENCOUNTER — Other Ambulatory Visit (INDEPENDENT_AMBULATORY_CARE_PROVIDER_SITE_OTHER): Payer: Medicare Other

## 2013-12-15 DIAGNOSIS — Z1212 Encounter for screening for malignant neoplasm of rectum: Secondary | ICD-10-CM | POA: Diagnosis not present

## 2013-12-15 LAB — POC HEMOCCULT BLD/STL (HOME/3-CARD/SCREEN)
Card #2 Fecal Occult Blod, POC: NEGATIVE
FECAL OCCULT BLD: NEGATIVE
Fecal Occult Blood, POC: NEGATIVE

## 2014-02-24 ENCOUNTER — Ambulatory Visit (INDEPENDENT_AMBULATORY_CARE_PROVIDER_SITE_OTHER): Payer: Medicare Other | Admitting: Physician Assistant

## 2014-02-24 ENCOUNTER — Ambulatory Visit (HOSPITAL_COMMUNITY)
Admission: RE | Admit: 2014-02-24 | Discharge: 2014-02-24 | Disposition: A | Discharge status: Home / Self Care | Payer: Medicare Other | Source: Ambulatory Visit | Attending: Physician Assistant | Admitting: Physician Assistant

## 2014-02-24 VITALS — BP 146/70 | HR 64 | Temp 97.9°F | Resp 16 | Wt 217.0 lb

## 2014-02-24 DIAGNOSIS — I700 Atherosclerosis of aorta: Secondary | ICD-10-CM | POA: Insufficient documentation

## 2014-02-24 DIAGNOSIS — M5137 Other intervertebral disc degeneration, lumbosacral region: Secondary | ICD-10-CM | POA: Insufficient documentation

## 2014-02-24 DIAGNOSIS — M1610 Unilateral primary osteoarthritis, unspecified hip: Secondary | ICD-10-CM | POA: Insufficient documentation

## 2014-02-24 DIAGNOSIS — M25551 Pain in right hip: Secondary | ICD-10-CM

## 2014-02-24 DIAGNOSIS — M545 Low back pain, unspecified: Secondary | ICD-10-CM | POA: Diagnosis not present

## 2014-02-24 DIAGNOSIS — M51379 Other intervertebral disc degeneration, lumbosacral region without mention of lumbar back pain or lower extremity pain: Secondary | ICD-10-CM | POA: Diagnosis not present

## 2014-02-24 DIAGNOSIS — M169 Osteoarthritis of hip, unspecified: Secondary | ICD-10-CM | POA: Diagnosis not present

## 2014-02-24 DIAGNOSIS — M25559 Pain in unspecified hip: Secondary | ICD-10-CM | POA: Insufficient documentation

## 2014-02-24 MED ORDER — PREDNISONE 20 MG PO TABS: ORAL_TABLET | ORAL | Status: DC

## 2014-02-24 NOTE — Progress Notes (Signed)
   Subjective:    Patient ID: VUE PAVON, male    DOB: 07/11/1930, 80 y.o.   MRN: [id number]  HPI 80 y.o. male with history of fall June 6th, now with right hip pain. He states that he was opening the car door, thought it was locked and pulled hard, fell onto cement. Denies LOC. Several days after that he started to have right lateral hip pain. He states it is worse with ridding or driving a car, it will "lock up on him". Denies pain down his leg, no numbness/tingling, denies bowel/bladder problems. He falls frequently.    Review of Systems  Constitutional: Negative.   HENT: Negative.   Respiratory: Negative.   Cardiovascular: Negative.   Gastrointestinal: Negative.   Genitourinary: Negative.   Musculoskeletal: Positive for back pain, gait problem and myalgias. Negative for joint swelling, neck pain and neck stiffness.  Neurological: Negative.   Psychiatric/Behavioral: Negative.        Objective:   Physical Exam  Constitutional: He is oriented to person, place, and time. He appears well-developed and well-nourished.  HENT:  Head: Normocephalic and atraumatic.  Eyes: Conjunctivae are normal. Pupils are equal, round, and reactive to light.  Neck: Normal range of motion. Neck supple.  Musculoskeletal:  Patient is not able to ambulate well.  Gait is  antalgic. Straight leg raising negative bilaterally for radicular symptoms. Sensory exam in the legs is abnormal .  Knee reflexes are abnormal bilateral Ankle reflexes are abnormal  Bilateral Strength is decreased There is notparaspinal muscle spasm.  There is not midline tenderness.  ROM of right hip normal   Neurological: He is alert and oriented to person, place, and time.  Skin: Skin is warm and dry.       Assessment & Plan:  Right back pain- unlikely hip, good ROM- ? Spinal stenosis- will get Xray right hip and lower back, will do prednisone taper and will send to orthopedic doctor for possible injection.

## 2014-02-24 NOTE — Patient Instructions (Signed)
Spinal Stenosis Spinal stenosis is an abnormal narrowing of the canals of your spine (vertebrae). CAUSES  Spinal stenosis is caused by areas of bone pushing into the central canals of your vertebrae. This condition can be present at birth (congenital). It also may be caused by arthritic deterioration of your vertebrae (spinal degeneration).  SYMPTOMS   Pain that is generally worse with activities, particularly standing and walking.  Numbness, tingling, hot or cold sensations, weakness, or weariness in your legs.  Frequent episodes of falling.  A foot-slapping gait that leads to muscle weakness. DIAGNOSIS  Spinal stenosis is diagnosed with the use of magnetic resonance imaging (MRI) or computed tomography (CT). TREATMENT  Initial therapy for spinal stenosis focuses on the management of the pain and other symptoms associated with the condition. These therapies include:  Practicing postural changes to lessen pressure on your nerves.  Exercises to strengthen the core of your body.  Loss of excess body weight.  The use of nonsteroidal anti-inflammatory medications to reduce swelling and inflammation in your nerves. When therapies to manage pain are not successful, surgery to treat spinal stenosis may be recommended. This surgery involves removing excess bone, which puts pressure on your nerve roots. During this surgery (laminectomy), the posterior boney arch (lamina) and excess bone around the facet joints are removed. Document Released: 10/28/2003 Document Revised: 12/02/2012 Document Reviewed: 11/15/2012 [employer] Patient Information 2015 [employer]. This information is not intended to replace advice given to you by your health care provider. Make sure you discuss any questions you have with your health care provider.  

## 2014-03-04 ENCOUNTER — Ambulatory Visit (INDEPENDENT_AMBULATORY_CARE_PROVIDER_SITE_OTHER): Payer: Medicare Other | Admitting: Physician Assistant

## 2014-03-04 ENCOUNTER — Encounter: Payer: Self-pay | Admitting: Physician Assistant

## 2014-03-04 VITALS — BP 142/72 | HR 72 | Temp 98.2°F | Resp 16 | Wt 215.0 lb

## 2014-03-04 DIAGNOSIS — Z0000 Encounter for general adult medical examination without abnormal findings: Secondary | ICD-10-CM | POA: Diagnosis not present

## 2014-03-04 DIAGNOSIS — E785 Hyperlipidemia, unspecified: Secondary | ICD-10-CM

## 2014-03-04 DIAGNOSIS — R7309 Other abnormal glucose: Secondary | ICD-10-CM | POA: Diagnosis not present

## 2014-03-04 DIAGNOSIS — Z9181 History of falling: Secondary | ICD-10-CM

## 2014-03-04 DIAGNOSIS — E559 Vitamin D deficiency, unspecified: Secondary | ICD-10-CM

## 2014-03-04 DIAGNOSIS — I10 Essential (primary) hypertension: Secondary | ICD-10-CM | POA: Diagnosis not present

## 2014-03-04 DIAGNOSIS — Z1331 Encounter for screening for depression: Secondary | ICD-10-CM

## 2014-03-04 DIAGNOSIS — Z79899 Other long term (current) drug therapy: Secondary | ICD-10-CM | POA: Diagnosis not present

## 2014-03-04 DIAGNOSIS — R7303 Prediabetes: Secondary | ICD-10-CM

## 2014-03-04 DIAGNOSIS — D696 Thrombocytopenia, unspecified: Secondary | ICD-10-CM

## 2014-03-04 LAB — CBC WITH DIFFERENTIAL/PLATELET
Basophils Absolute: 0 10*3/uL (ref 0.0–0.1)
Basophils Relative: 0 % (ref 0–1)
EOS PCT: 2 % (ref 0–5)
Eosinophils Absolute: 0.1 10*3/uL (ref 0.0–0.7)
HEMATOCRIT: 41 % (ref 39.0–52.0)
HEMOGLOBIN: 14.1 g/dL (ref 13.0–17.0)
LYMPHS ABS: 2 10*3/uL (ref 0.7–4.0)
LYMPHS PCT: 32 % (ref 12–46)
MCH: 31.1 pg (ref 26.0–34.0)
MCHC: 34.4 g/dL (ref 30.0–36.0)
MCV: 90.5 fL (ref 78.0–100.0)
MONO ABS: 0.4 10*3/uL (ref 0.1–1.0)
MONOS PCT: 7 % (ref 3–12)
NEUTROS ABS: 3.6 10*3/uL (ref 1.7–7.7)
Neutrophils Relative %: 59 % (ref 43–77)
Platelets: 208 10*3/uL (ref 150–400)
RBC: 4.53 MIL/uL (ref 4.22–5.81)
RDW: 13.1 % (ref 11.5–15.5)
WBC: 6.1 10*3/uL (ref 4.0–10.5)

## 2014-03-04 LAB — MAGNESIUM: Magnesium: 1.7 mg/dL (ref 1.5–2.5)

## 2014-03-04 LAB — BASIC METABOLIC PANEL WITH GFR
BUN: 17 mg/dL (ref 6–23)
CHLORIDE: 93 meq/L — AB (ref 96–112)
CO2: 29 meq/L (ref 19–32)
CREATININE: 0.58 mg/dL (ref 0.50–1.35)
Calcium: 9.2 mg/dL (ref 8.4–10.5)
GFR, Est African American: >89 mL/min
GFR, Est Non African American: >89 mL/min
GLUCOSE: 98 mg/dL (ref 70–99)
POTASSIUM: 4.5 meq/L (ref 3.5–5.3)
Sodium: 131 mEq/L — ABNORMAL LOW (ref 135–145)

## 2014-03-04 LAB — HEPATIC FUNCTION PANEL
ALT: 19 U/L (ref 0–53)
AST: 22 U/L (ref 0–37)
Albumin: 4.2 g/dL (ref 3.5–5.2)
Alkaline Phosphatase: 53 U/L (ref 39–117)
BILIRUBIN DIRECT: 0.2 mg/dL (ref 0.0–0.3)
BILIRUBIN TOTAL: 0.8 mg/dL (ref 0.2–1.2)
Indirect Bilirubin: 0.6 mg/dL (ref 0.2–1.2)
Total Protein: 6.7 g/dL (ref 6.0–8.3)

## 2014-03-04 LAB — LIPID PANEL
CHOL/HDL RATIO: 3.1 ratio
CHOLESTEROL: 152 mg/dL (ref 0–200)
HDL: 49 mg/dL (ref >39)
LDL Cholesterol: 71 mg/dL (ref 0–99)
TRIGLYCERIDES: 158 mg/dL — AB (ref <150)
VLDL: 32 mg/dL (ref 0–40)

## 2014-03-04 LAB — HEMOGLOBIN A1C
Hgb A1c MFr Bld: 5.7 % — ABNORMAL HIGH (ref <5.7)
MEAN PLASMA GLUCOSE: 117 mg/dL — AB (ref <117)

## 2014-03-04 LAB — TSH: TSH: 1.067 u[IU]/mL (ref 0.350–4.500)

## 2014-03-04 NOTE — Progress Notes (Signed)
MEDICARE ANNUAL WELLNESS VISIT AND FOLLOW UP Assessment:   Hypertension: Continue medication, monitor blood pressure at home. Continue DASH diet. Cholesterol: Continue diet and exercise. Check cholesterol.  Diabetes-Continue diet and exercise. Check A1C Vitamin D Def- check level and continue medications.  Right back pain/hip pain- likely more SI, seeing ortho Friday. Suggest PT.  High fall risk- long discussion about fall risk, patient declines PT at this time, states he has done in the past without any results Thrombocytopenia- check CBC, monitor   Plan:   During the course of the visit the patient was educated and counseled about appropriate screening and preventive services including:    Pneumococcal vaccine   Influenza vaccine  Td vaccine  Screening electrocardiogram  Colorectal cancer screening  Diabetes screening  Glaucoma screening  Nutrition counseling   Screening recommendations, referrals: Vaccinations: Tdap vaccine next year Influenza vaccine due in Aug/Sept Pneumococcal vaccine up to date, need to wait a year for prevnar 13 Shingles vaccine checking price Hep B vaccine not indicated  Nutrition assessed and recommended  Colonoscopy declined Recommended yearly ophthalmology/optometry visit for glaucoma screening and checkup Recommended yearly dental visit for hygiene and checkup Advanced directives - requested  Conditions/risks identified: BMI: Discussed weight loss, diet, and increase physical activity.  Increase physical activity: AHA recommends 150 minutes of physical activity a week.  Medications reviewed Diabetes is at goal, ACE/ARB therapy: Yes. Urinary Incontinence is not an issue: discussed non pharmacology and pharmacology options.  Fall risk: high- discussed PT, home fall assessment, medications.    Subjective:  Tracy Brandt is a 80 y.o. male who presents for Medicare Annual Wellness Visit and 3 month follow up for HTN, hyperlipidemia,  prediabetes, and vitamin D Def.  Date of last medicare wellness visit was is unknown.  His blood pressure has been controlled at home, today their BP is BP: 142/72 mmHg He does not workout. He denies chest pain, shortness of breath, dizziness.  He is on cholesterol medication and denies myalgias. His cholesterol is at goal. The cholesterol last visit was:   Lab Results  Component Value Date   CHOL 174 12/01/2013   HDL 41 12/01/2013   LDLCALC 102* 12/01/2013   TRIG 155* 12/01/2013   CHOLHDL 4.2 12/01/2013   He has been working on diet and exercise for prediabetes, and denies polydipsia, polyuria and visual disturbances. Last A1C in the office was:  Lab Results  Component Value Date   HGBA1C 5.8* 12/01/2013   Patient is on Vitamin D supplement. Lab Results  Component Value Date   VD25OH 65 12/01/2013     Patient had a fall June 6th, came to the office and was given prednisone, xrays and has an appointment with ortho on Friday. Continuing right hip pain, worse with sitting, driving, no numbness/tingling/change in bladder/bowel.      Names of Other Physician/Practitioners you currently use: 1. [facility] here [facility] Team: Unk Pinto, MD as PCP - General (Internal Medicine)  Medication Review: Current Outpatient Prescriptions on File Prior to Visit  Medication Sig Dispense Refill  . atenolol (TENORMIN) 100 MG tablet Take 100 mg by mouth daily.      Marland Kitchen atorvastatin (LIPITOR) 40 MG tablet Take 40 mg by mouth daily.        . Cholecalciferol (VITAMIN D PO) Take 1 tablet by mouth daily.      Marland Kitchen doxazosin (CARDURA)  8 MG tablet Take 8 mg by mouth at bedtime.      . fish oil-omega-3 fatty acids 1000 MG capsule Take 1 g by mouth daily.      Marland Kitchen HYDROcodone-acetaminophen (NORCO/VICODIN) 5-325 MG per tablet Take 0.5-1 tablets by mouth every 6 (six) hours as  needed for pain.      Marland Kitchen lisinopril (PRINIVIL,ZESTRIL) 40 MG tablet Take 20-40 mg by mouth 2 (two) times daily. Take 1/2 a tablet in the morning and 1 tablet in the evening      . Magnesium 250 MG TABS Take 250 mg by mouth daily.      . ondansetron (ZOFRAN) 4 MG tablet Take 1 tablet (4 mg total) by mouth every 6 (six) hours as needed.  20 tablet  0  . predniSONE (DELTASONE) 20 MG tablet 3 tablets daily with food for 3 days, 2 tabs daily for 3 days, 1 tab a day for 5 days.  20 tablet  0   No current facility-administered medications on file prior to visit.    Current Problems (verified) Patient Active Problem List   Diagnosis Date Noted  . Encounter for long-term (current) use of other medications 12/01/2013  . DJD 12/01/2013  . Hypertension   . Hyperlipidemia   . Prediabetes   . GERD (gastroesophageal reflux disease)   . BPH (benign prostatic hyperplasia)   . Vitamin D deficiency   . Gout   . Irregular heart rate 12/01/2012  . Thrombocytopenia 12/01/2012    Screening Tests Health Maintenance  Topic Date Due  . Colonoscopy  06/08/1982  . Zostavax  06/08/1992  . Influenza Vaccine  03/21/2014  . Tetanus/tdap  07/21/2015  . Pneumococcal Polysaccharide Vaccine Age 36 And Over  Completed    Immunization History  Administered Date(s) Administered  . Pneumococcal Polysaccharide-23 05/28/2013  . Td 07/20/2005    Preventative care: Last colonoscopy: 2005 will not get another Neg CT AB/Chest in the ER 02/2012 Neg CT brain 05/2010  Prior vaccinations: TD or Tdap: 2006  Influenza: 2014 Pneumococcal: 2014 Prevnar 13: will need to wait a year Shingles/Zostavax: check price  History reviewed: allergies, current medications, past family history, past medical history, past social history, past surgical history and problem list   Risk Factors: Tobacco History  Substance Use Topics  . Smoking status: Never Smoker   . Smokeless tobacco: Not on file  . Alcohol Use: No   He  does not smoke.  Patient is not a former smoker. Are there smokers in your home (other than you)?  No  Alcohol Current alcohol use: none  Caffeine Current caffeine use: coffee 2-4 /day  Exercise Current exercise: walking in the house, otherwise uses a scooter outside the house to prevent falls  Nutrition/Diet Current diet: in general, a "healthy" diet    Cardiac risk factors: advanced age (older than 97 for men, 33 for women), dyslipidemia, family history of premature cardiovascular disease, hypertension, male gender, obesity (BMI >= 30 kg/m2) and sedentary lifestyle.  Depression Screen (Note: if answer to either of the following is "Yes", a more complete depression screening is indicated)   Q1: Over the past two weeks, have you felt down, depressed or hopeless? No  Q2: Over the past two weeks, have you felt little interest or pleasure in doing things? No  Have you lost interest or pleasure in daily life? No  Do you often feel hopeless? No  Do you cry easily over simple problems? No  Activities of Daily Living In your present  state of health, do you have any difficulty performing the following activities?:  Driving? No Managing money?  No Feeding yourself? No Getting from bed to chair? No Climbing a flight of stairs? No Preparing food and eating?: No Bathing or showering? No Getting dressed: No Getting to the toilet? No Using the toilet:No Moving around from place to place: No In the past year have you fallen or had a near fall?:Yes   Are you sexually active?  No  Do you have more than one partner?  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 Yes Do you experience ringing or noises in your ears? No Do you have difficulty understanding soft or whispered voices? Yes  Cognition  Do you feel that you have a problem with memory?Yes  Do you often misplace items? No  Do you feel safe  at home?  Yes  Advanced directives Does patient have a [place]? Yes Does patient have a Living Will? Yes   Objective:   Blood pressure 142/72, pulse 72, temperature 98.2 F (36.8 C), resp. rate 16, weight 215 lb (97.523 kg). Body mass index is 30.4 kg/(m^2).  General appearance: alert, no distress, WD/WN, male Cognitive Testing  Alert? Yes  Normal Appearance?Yes  Oriented to person? Yes  Place? Yes   Time? Yes  Recall of three objects?  Yes  Can perform simple calculations? Yes  Displays appropriate judgment?Yes  Can read the correct time from a watch face?Yes  HEENT: normocephalic, sclerae anicteric, TMs pearly, nares patent, no discharge or erythema, pharynx normal Oral cavity: MMM, no lesions Neck: supple, no lymphadenopathy, no thyromegaly, no masses Heart: Irreg rhythm normal S1, S2, no murmurs Lungs: CTA bilaterally, no wheezes, rhonchi, or rales Abdomen: +bs, soft, non tender, non distended, no masses, no hepatomegaly, no splenomegaly Musculoskeletal:  Patient is not able to ambulate well. Gait is antalgic. Straight leg raising negative bilaterally for radicular symptoms. Sensory exam in the legs is abnormal .  Knee reflexes are abnormal bilateral Ankle reflexes are abnormal Bilateral Strength is decreased There is notparaspinal muscle spasm. There is not midline tenderness. ROM of right hip normal Extremities: 1+ edema, no cyanosis, no clubbing Pulses: 2+ symmetric, upper and lower extremities, normal cap refill Neurological: alert, oriented x 3, CN2-12 intact, strength 4/5 upper extremities and lower extremities,  DTRs 2+ throughout upper, decrease bilateral lower legs, gait unsteady Psychiatric: normal affect, behavior normal, pleasant   Medicare Attestation I have personally reviewed: The patient's medical and social history Their use of alcohol, tobacco or illicit drugs Their current medications and supplements The patient's functional  ability including ADLs,fall risks, home safety risks, cognitive, and hearing and visual impairment Diet and physical activities Evidence for depression or mood disorders  The patient's weight, height, BMI, and visual acuity have been recorded in the chart.  I have made referrals, counseling, and provided education to the patient based on review of the above and I have provided the patient with a written personalized care plan for preventive services.     Vicie Mutters, PA-C   03/04/2014

## 2014-03-04 NOTE — Patient Instructions (Signed)
Please call your insurance, may need to sign up for part D or ask them how much shingles vaccine will cost  Preventative Care for Adults, Male       [place]:  A routine yearly physical is a good way to check in with your primary care provider about your health and preventive screening. It is also an opportunity to share updates about your health and any concerns you have, and receive a thorough all-over exam.   Most health insurance companies pay for at least some preventative services.  Check with your health plan for specific coverages.  WHAT PREVENTATIVE SERVICES DO MEN NEED?  Adult men should have their weight and blood pressure checked regularly.   Men age 84 and older should have their cholesterol levels checked regularly.  Beginning at age 28 and continuing to age 22, men should be screened for colorectal cancer.  Certain people should may need continued testing until age 65.  Other cancer screening may include exams for testicular and prostate cancer.  Updating vaccinations is part of preventative care.  Vaccinations help protect against diseases such as the flu.  Lab tests are generally done as part of preventative care to screen for anemia and blood disorders, to screen for problems with the kidneys and liver, to screen for bladder problems, to check blood sugar, and to check your cholesterol level.  Preventative services generally include counseling about diet, exercise, avoiding tobacco, drugs, excessive alcohol consumption, and sexually transmitted infections.    GENERAL RECOMMENDATIONS FOR GOOD HEALTH:  Healthy diet:  Eat a variety of foods, including fruit, vegetables, animal or vegetable protein, such as meat, fish, chicken, and eggs, or beans, lentils, tofu, and grains, such as rice.  Drink plenty of water daily.  Decrease saturated fat in the diet, avoid lots of red meat, processed foods, sweets, fast foods, and fried foods.  Exercise:  Aerobic  exercise helps maintain good heart health. At least 30-40 minutes of moderate-intensity exercise is recommended. For example, a brisk walk that increases your heart rate and breathing. This should be done on most days of the week.   Find a type of exercise or a variety of exercises that you enjoy so that it becomes a part of your daily life.  Examples are running, walking, swimming, water aerobics, and biking.  For motivation and support, explore group exercise such as aerobic class, spin class, Zumba, Yoga,or  martial arts, etc.    Set exercise goals for yourself, such as a certain weight goal, walk or run in a race such as a 5k walk/run.  Speak to your primary care provider about exercise goals.  Disease prevention:  If you smoke or chew tobacco, find out from your caregiver how to quit. It can literally save your life, no matter how long you have been a tobacco user. If you do not use tobacco, never begin.   Maintain a healthy diet and normal weight. Increased weight leads to problems with blood pressure and diabetes.   The Body Mass Index or BMI is a way of measuring how much of your body is fat. Having a BMI above 27 increases the risk of heart disease, diabetes, hypertension, stroke and other problems related to obesity. Your caregiver can help determine your BMI and based on it develop an exercise and dietary program to help you achieve or maintain this important measurement at a healthful level.  High blood pressure causes heart and blood vessel problems.  Persistent high blood pressure should  be treated with medicine if weight loss and exercise do not work.   Fat and cholesterol leaves deposits in your arteries that can block them. This causes heart disease and vessel disease elsewhere in your body.  If your cholesterol is found to be high, or if you have heart disease or certain other medical conditions, then you may need to have your cholesterol monitored frequently and be treated with  medication.   Ask if you should have a stress test if your history suggests this. A stress test is a test done on a treadmill that looks for heart disease. This test can find disease prior to there being a problem.  Avoid drinking alcohol in excess (more than two drinks per day).  Avoid use of street drugs. Do not share needles with anyone. Ask for professional help if you need assistance or instructions on stopping the use of alcohol, cigarettes, and/or drugs.  Brush your teeth twice a day with fluoride toothpaste, and floss once a day. Good oral hygiene prevents tooth decay and gum disease. The problems can be painful, unattractive, and can cause other health problems. Visit your dentist for a routine oral and dental check up and preventive care every 6-12 months.   Look at your skin regularly.  Use a mirror to look at your back. Notify your caregivers of changes in moles, especially if there are changes in shapes, colors, a size larger than a pencil eraser, an irregular border, or development of new moles.  Safety:  Use seatbelts 100% of the time, whether driving or as a passenger.  Use safety devices such as hearing protection if you work in environments with loud noise or significant background noise.  Use safety glasses when doing any work that could send debris in to the eyes.  Use a helmet if you ride a bike or motorcycle.  Use appropriate safety gear for contact sports.  Talk to your caregiver about gun safety.  Use sunscreen with a SPF (or skin protection factor) of 15 or greater.  Lighter skinned people are at a greater risk of skin cancer. Don't forget to also wear sunglasses in order to protect your eyes from too much damaging sunlight. Damaging sunlight can accelerate cataract formation.   Practice safe sex. Use condoms. Condoms are used for birth control and to help reduce the spread of sexually transmitted infections (or STIs).  Some of the STIs are gonorrhea (the clap), chlamydia,  syphilis, trichomonas, herpes, HPV (human papilloma virus) and HIV (human immunodeficiency virus) which causes AIDS. The herpes, HIV and HPV are viral illnesses that have no cure. These can result in disability, cancer and death.   Keep carbon monoxide and smoke detectors in your home functioning at all times. Change the batteries every 6 months or use a model that plugs into the wall.   Vaccinations:  Stay up to date with your tetanus shots and other required immunizations. You should have a booster for tetanus every 10 years. Be sure to get your flu shot every year, since 5%-20% of the [place] population comes down with the flu. The flu vaccine changes each year, so being vaccinated once is not enough. Get your shot in the fall, before the flu season peaks.   Other vaccines to consider:  Pneumococcal vaccine to protect against certain types of pneumonia.  This is normally recommended for adults age 65 or older.  However, adults younger than 78 years old with certain underlying conditions such as diabetes, heart or lung disease  should also receive the vaccine.  Shingles vaccine to protect against Varicella Zoster if you are older than age 63, or younger than 78 years old with certain underlying illness.  Hepatitis A vaccine to protect against a form of infection of the liver by a virus acquired from food.  Hepatitis B vaccine to protect against a form of infection of the liver by a virus acquired from blood or body fluids, particularly if you work in health care.  If you plan to travel internationally, check with your local health department for specific vaccination recommendations.  Cancer Screening:  Most routine colon cancer screening begins at the age of 46. On a yearly basis, doctors may provide special easy to use take-home tests to check for hidden blood in the stool. Sigmoidoscopy or colonoscopy can detect the earliest forms of colon cancer and is life saving. These tests use a small  camera at the end of a tube to directly examine the colon. Speak to your caregiver about this at age 31, when routine screening begins (and is repeated every 5 years unless early forms of pre-cancerous polyps or small growths are found).   At the age of 17 men usually start screening for prostate cancer every year. Screening may begin at a younger age for those with higher risk. Those at higher risk include African-Americans or having a family history of prostate cancer. There are two types of tests for prostate cancer:   Prostate-specific antigen (PSA) testing. Recent studies raise questions about prostate cancer using PSA and you should discuss this with your caregiver.   Digital rectal exam (in which your doctor's lubricated and gloved finger feels for enlargement of the prostate through the anus).   Screening for testicular cancer.  Do a monthly exam of your testicles. Gently roll each testicle between your thumb and fingers, feeling for any abnormal lumps. The best time to do this is after a hot shower or bath when the tissues are looser. Notify your caregivers of any lumps, tenderness or changes in size or shape immediately.

## 2014-03-05 ENCOUNTER — Other Ambulatory Visit: Payer: Self-pay | Admitting: Emergency Medicine

## 2014-03-05 DIAGNOSIS — R6889 Other general symptoms and signs: Secondary | ICD-10-CM

## 2014-03-05 LAB — INSULIN, FASTING: Insulin fasting, serum: 28 u[IU]/mL (ref 3–28)

## 2014-03-06 DIAGNOSIS — M76899 Other specified enthesopathies of unspecified lower limb, excluding foot: Secondary | ICD-10-CM | POA: Diagnosis not present

## 2014-03-06 DIAGNOSIS — M48061 Spinal stenosis, lumbar region without neurogenic claudication: Secondary | ICD-10-CM | POA: Diagnosis not present

## 2014-03-18 ENCOUNTER — Other Ambulatory Visit: Payer: Self-pay | Admitting: Orthopedic Surgery

## 2014-03-18 DIAGNOSIS — M545 Low back pain, unspecified: Secondary | ICD-10-CM

## 2014-03-27 ENCOUNTER — Ambulatory Visit
Admission: RE | Admit: 2014-03-27 | Discharge: 2014-03-27 | Disposition: A | Discharge status: Home / Self Care | Payer: Medicare Other | Source: Ambulatory Visit | Attending: Orthopedic Surgery | Admitting: Orthopedic Surgery

## 2014-03-27 DIAGNOSIS — M545 Low back pain, unspecified: Secondary | ICD-10-CM

## 2014-03-27 DIAGNOSIS — M48061 Spinal stenosis, lumbar region without neurogenic claudication: Secondary | ICD-10-CM | POA: Diagnosis not present

## 2014-03-27 DIAGNOSIS — M5126 Other intervertebral disc displacement, lumbar region: Secondary | ICD-10-CM | POA: Diagnosis not present

## 2014-03-27 DIAGNOSIS — M47817 Spondylosis without myelopathy or radiculopathy, lumbosacral region: Secondary | ICD-10-CM | POA: Diagnosis not present

## 2014-04-08 ENCOUNTER — Ambulatory Visit: Payer: Self-pay | Admitting: Internal Medicine

## 2014-04-13 DIAGNOSIS — IMO0002 Reserved for concepts with insufficient information to code with codable children: Secondary | ICD-10-CM | POA: Diagnosis not present

## 2014-04-15 DIAGNOSIS — M48061 Spinal stenosis, lumbar region without neurogenic claudication: Secondary | ICD-10-CM | POA: Diagnosis not present

## 2014-04-23 DIAGNOSIS — M48061 Spinal stenosis, lumbar region without neurogenic claudication: Secondary | ICD-10-CM | POA: Diagnosis not present

## 2014-05-06 DIAGNOSIS — M76899 Other specified enthesopathies of unspecified lower limb, excluding foot: Secondary | ICD-10-CM | POA: Diagnosis not present

## 2014-05-06 DIAGNOSIS — M48061 Spinal stenosis, lumbar region without neurogenic claudication: Secondary | ICD-10-CM | POA: Diagnosis not present

## 2014-06-01 DIAGNOSIS — M4806 Spinal stenosis, lumbar region: Secondary | ICD-10-CM | POA: Diagnosis not present

## 2014-06-08 ENCOUNTER — Encounter: Payer: Self-pay | Admitting: Internal Medicine

## 2014-06-08 ENCOUNTER — Ambulatory Visit (INDEPENDENT_AMBULATORY_CARE_PROVIDER_SITE_OTHER): Payer: Medicare Other | Admitting: Internal Medicine

## 2014-06-08 VITALS — BP 138/74 | HR 84 | Temp 98.1°F | Resp 16 | Ht 71.0 in | Wt 217.2 lb

## 2014-06-08 DIAGNOSIS — I10 Essential (primary) hypertension: Secondary | ICD-10-CM | POA: Diagnosis not present

## 2014-06-08 DIAGNOSIS — R7309 Other abnormal glucose: Secondary | ICD-10-CM | POA: Diagnosis not present

## 2014-06-08 DIAGNOSIS — M109 Gout, unspecified: Secondary | ICD-10-CM

## 2014-06-08 DIAGNOSIS — Z79899 Other long term (current) drug therapy: Secondary | ICD-10-CM

## 2014-06-08 DIAGNOSIS — Z6825 Body mass index (BMI) 25.0-25.9, adult: Secondary | ICD-10-CM | POA: Insufficient documentation

## 2014-06-08 DIAGNOSIS — E785 Hyperlipidemia, unspecified: Secondary | ICD-10-CM | POA: Diagnosis not present

## 2014-06-08 DIAGNOSIS — Z23 Encounter for immunization: Secondary | ICD-10-CM | POA: Diagnosis not present

## 2014-06-08 DIAGNOSIS — R7303 Prediabetes: Secondary | ICD-10-CM

## 2014-06-08 DIAGNOSIS — R269 Unspecified abnormalities of gait and mobility: Secondary | ICD-10-CM

## 2014-06-08 DIAGNOSIS — E559 Vitamin D deficiency, unspecified: Secondary | ICD-10-CM | POA: Diagnosis not present

## 2014-06-08 LAB — BASIC METABOLIC PANEL WITH GFR
BUN: 10 mg/dL (ref 6–23)
CHLORIDE: 94 meq/L — AB (ref 96–112)
CO2: 28 mEq/L (ref 19–32)
Calcium: 9.7 mg/dL (ref 8.4–10.5)
Creat: 0.56 mg/dL (ref 0.50–1.35)
GFR, Est African American: >89 mL/min
Glucose, Bld: 120 mg/dL — ABNORMAL HIGH (ref 70–99)
POTASSIUM: 4.4 meq/L (ref 3.5–5.3)
SODIUM: 132 meq/L — AB (ref 135–145)

## 2014-06-08 LAB — HEPATIC FUNCTION PANEL
ALK PHOS: 53 U/L (ref 39–117)
ALT: 17 U/L (ref 0–53)
AST: 19 U/L (ref 0–37)
Albumin: 4.5 g/dL (ref 3.5–5.2)
BILIRUBIN DIRECT: 0.2 mg/dL (ref 0.0–0.3)
BILIRUBIN INDIRECT: 0.9 mg/dL (ref 0.2–1.2)
BILIRUBIN TOTAL: 1.1 mg/dL (ref 0.2–1.2)
Total Protein: 7 g/dL (ref 6.0–8.3)

## 2014-06-08 LAB — CBC WITH DIFFERENTIAL/PLATELET
BASOS ABS: 0 10*3/uL (ref 0.0–0.1)
Basophils Relative: 0 % (ref 0–1)
Eosinophils Absolute: 0.1 10*3/uL (ref 0.0–0.7)
Eosinophils Relative: 1 % (ref 0–5)
HEMATOCRIT: 43.2 % (ref 39.0–52.0)
Hemoglobin: 14.7 g/dL (ref 13.0–17.0)
LYMPHS PCT: 37 % (ref 12–46)
Lymphs Abs: 1.9 10*3/uL (ref 0.7–4.0)
MCH: 31.5 pg (ref 26.0–34.0)
MCHC: 34 g/dL (ref 30.0–36.0)
MCV: 92.5 fL (ref 78.0–100.0)
MONO ABS: 0.3 10*3/uL (ref 0.1–1.0)
Monocytes Relative: 6 % (ref 3–12)
NEUTROS ABS: 2.8 10*3/uL (ref 1.7–7.7)
NEUTROS PCT: 56 % (ref 43–77)
Platelets: 225 10*3/uL (ref 150–400)
RBC: 4.67 MIL/uL (ref 4.22–5.81)
RDW: 13 % (ref 11.5–15.5)
WBC: 5 10*3/uL (ref 4.0–10.5)

## 2014-06-08 LAB — URIC ACID: URIC ACID, SERUM: 6.4 mg/dL (ref 4.0–7.8)

## 2014-06-08 LAB — LIPID PANEL
CHOL/HDL RATIO: 3.2 ratio
CHOLESTEROL: 178 mg/dL (ref 0–200)
HDL: 56 mg/dL (ref >39)
LDL CALC: 95 mg/dL (ref 0–99)
TRIGLYCERIDES: 133 mg/dL (ref <150)
VLDL: 27 mg/dL (ref 0–40)

## 2014-06-08 LAB — TSH: TSH: 1.281 u[IU]/mL (ref 0.350–4.500)

## 2014-06-08 LAB — MAGNESIUM: Magnesium: 1.6 mg/dL (ref 1.5–2.5)

## 2014-06-08 LAB — HEMOGLOBIN A1C
Hgb A1c MFr Bld: 5.7 % — ABNORMAL HIGH (ref <5.7)
Mean Plasma Glucose: 117 mg/dL — ABNORMAL HIGH (ref <117)

## 2014-06-08 NOTE — Patient Instructions (Signed)

## 2014-06-08 NOTE — Progress Notes (Signed)
Patient ID: Tracy Brandt, male   DOB: 21-Dec-1929, 80 y.o.   MRN: [id number]   This very nice 80 y.o.WWM presents for 3 month follow up with Hypertension, Hyperlipidemia, Pre-Diabetes and Vitamin D Deficiency. Patient has an undefined gait disorder worked up at the [place] and has been provided an Transport planner for mobility.   Patient is treated for HTN & BP has been controlled at home. Today's BP: 138/74 mmHg. Patient has had no complaints of any cardiac type chest pain, palpitations, dyspnea/orthopnea/PND, dizziness, claudication, or dependent edema.   Hyperlipidemia is controlled with diet & meds. Patient denies myalgias or other med SE's. Last Lipids were at goal - Total Chol 152; HDL  49; LDL 71; Triglycerides 158 on 03/04/2014.   Also, the patient has history of PreDiabetes and has had no symptoms of reactive hypoglycemia, diabetic polys, paresthesias or visual blurring.  Last A1c was  5.7% on 03/04/2014.    Further, the patient also has history of Vitamin D Deficiency and supplements vitamin D without any suspected side-effects. Last vitamin D was  65 on 12/01/2013.   Medication List   atenolol 100 MG tablet  Commonly known as:  TENORMIN  Take 100 mg by mouth daily.     atorvastatin 40 MG tablet  Commonly known as:  LIPITOR  Take 40 mg by mouth daily.     doxazosin 8 MG tablet  Commonly known as:  CARDURA  Take 8 mg by mouth at bedtime.     fish oil-omega-3 fatty acids 1000 MG capsule  Take 1 g by mouth daily.     HYDROcodone-acetaminophen 5-325 MG per tablet  Commonly known as:  NORCO/VICODIN  Take 0.5-1 tablets by mouth every 6 (six) hours as needed for pain.     lisinopril 40 MG tablet  Commonly known as:  PRINIVIL,ZESTRIL  Take 20-40 mg by mouth 2 (two) times daily. Take 1/2 a tablet in the morning and 1 tablet in the evening     Magnesium 250 MG Tabs  Take 250 mg by mouth daily.     ondansetron 4 MG tablet  Commonly known as:  ZOFRAN  Take 1 tablet (4 mg total) by  mouth every 6 (six) hours as needed.     VITAMIN D PO  Take 1 tablet by mouth daily.     No Known Allergies  PMHx:   Past Medical History  Diagnosis Date  . Hypertension   . Hyperlipidemia   . Prediabetes   . Arthritis   . GERD (gastroesophageal reflux disease)   . BPH (benign prostatic hyperplasia)   . Vitamin D deficiency   . Gout    Immunization History  Administered Date(s) Administered  . Influenza, High Dose Seasonal PF 06/08/2014  . Pneumococcal Conjugate-13 05/25/2014  . Pneumococcal Polysaccharide-23 05/28/2013  . Td 07/20/2005   Past Surgical History  Procedure Laterality Date  . Back surgery    . Leg surgery    . Hemorrhoid surgery    . Eye surgery     FHx:    Reviewed / unchanged  SHx:    Reviewed / unchanged  Systems Review:  Constitutional: Denies fever, chills, wt changes, headaches, insomnia, fatigue, night sweats, change in appetite. Eyes: Denies redness, blurred vision, diplopia, discharge, itchy, watery eyes.  ENT: Denies discharge, congestion, post nasal drip, epistaxis, sore throat, earache, hearing loss, dental pain, tinnitus, vertigo, sinus pain, snoring.  CV: Denies chest pain, palpitations, irregular heartbeat, syncope, dyspnea, diaphoresis, orthopnea, PND, claudication or edema. Respiratory: denies  cough, dyspnea, DOE, pleurisy, hoarseness, laryngitis, wheezing.  Gastrointestinal: Denies dysphagia, odynophagia, heartburn, reflux, water brash, abdominal pain or cramps, nausea, vomiting, bloating, diarrhea, constipation, hematemesis, melena, hematochezia  or hemorrhoids. Genitourinary: Denies dysuria, frequency, urgency, nocturia, hesitancy, discharge, hematuria or flank pain. Musculoskeletal: Denies arthralgias, myalgias, stiffness, jt. swelling, pain, limping or strain/sprain.  Skin: Denies pruritus, rash, hives, warts, acne, eczema or change in skin lesion(s). Neuro: No weakness, tremor, incoordination, spasms, paresthesia or  pain. Psychiatric: Denies confusion, memory loss or sensory loss. Endo: Denies change in weight, skin or hair change.  Heme/Lymph: No excessive bleeding, bruising or enlarged lymph nodes.  Exam:  BP 138/74  Pulse 84  Temp 98.1 F   Resp 16  Ht 5\' 11"    Wt 217 lb 3.2 oz   BMI 30.31  Appears well nourished and in no distress. Eyes: PERRLA, EOMs, conjunctiva no swelling or erythema. Sinuses: No frontal/maxillary tenderness ENT/Mouth: EAC's clear, TM's nl w/o erythema, bulging. Nares clear w/o erythema, swelling, exudates. Oropharynx clear without erythema or exudates. Oral hygiene is good. Tongue normal, non obstructing. Hearing intact.  Neck: Supple. Thyroid nl. Car 2+/2+ without bruits, nodes or JVD. Chest: Respirations nl with BS clear & equal w/o rales, rhonchi, wheezing or stridor.  Cor: Heart sounds normal w/ regular rate and rhythm without sig. murmurs, gallops, clicks, or rubs. Peripheral pulses normal and equal  without edema.  Abdomen: Soft & bowel sounds normal. Non-tender w/o guarding, rebound, hernias, masses, or organomegaly.  Lymphatics: Unremarkable.  Musculoskeletal:  Gait not tested - patient in electric scooter due to unstable gait..  Skin: Warm, dry without exposed rashes, lesions or ecchymosis apparent.  Neuro: Cranial nerves intact, reflexes equal bilaterally. Sensory-motor testing grossly intact. Tendon reflexes grossly intact.  Pysch: Alert & oriented x 3.  Insight and judgement nl & appropriate. No ideations.  Assessment and Plan:  1. Hypertension - Continue monitor blood pressure at home. Continue diet/meds same.  2. Hyperlipidemia - Continue diet/meds, exercise,& lifestyle modifications. Continue monitor periodic cholesterol/liver & renal functions   3. Pre-Diabetes - Continue diet, exercise, lifestyle modifications. Monitor appropriate labs.  4. Vitamin D Deficiency - Continue supplementation.  5. Gout -    Recommended regular exercise, BP monitoring,  weight control, and discussed med and SE's. Recommended labs to assess and monitor clinical status. Further disposition pending results of labs.

## 2014-06-09 LAB — VITAMIN D 25 HYDROXY (VIT D DEFICIENCY, FRACTURES): Vit D, 25-Hydroxy: 86 ng/mL (ref 30–89)

## 2014-06-09 LAB — INSULIN, FASTING: Insulin fasting, serum: 14.7 u[IU]/mL (ref 2.0–19.6)

## 2014-09-17 ENCOUNTER — Ambulatory Visit (INDEPENDENT_AMBULATORY_CARE_PROVIDER_SITE_OTHER): Payer: Medicare Other | Admitting: Physician Assistant

## 2014-09-17 ENCOUNTER — Encounter: Payer: Self-pay | Admitting: Physician Assistant

## 2014-09-17 VITALS — BP 142/74 | HR 60 | Temp 97.9°F | Resp 16 | Ht 71.0 in | Wt 217.0 lb

## 2014-09-17 DIAGNOSIS — R296 Repeated falls: Secondary | ICD-10-CM | POA: Diagnosis not present

## 2014-09-17 DIAGNOSIS — E559 Vitamin D deficiency, unspecified: Secondary | ICD-10-CM

## 2014-09-17 DIAGNOSIS — M48062 Spinal stenosis, lumbar region with neurogenic claudication: Secondary | ICD-10-CM | POA: Insufficient documentation

## 2014-09-17 DIAGNOSIS — I10 Essential (primary) hypertension: Secondary | ICD-10-CM

## 2014-09-17 DIAGNOSIS — E785 Hyperlipidemia, unspecified: Secondary | ICD-10-CM | POA: Diagnosis not present

## 2014-09-17 DIAGNOSIS — Z79899 Other long term (current) drug therapy: Secondary | ICD-10-CM

## 2014-09-17 DIAGNOSIS — D696 Thrombocytopenia, unspecified: Secondary | ICD-10-CM

## 2014-09-17 DIAGNOSIS — R7309 Other abnormal glucose: Secondary | ICD-10-CM | POA: Diagnosis not present

## 2014-09-17 DIAGNOSIS — R7303 Prediabetes: Secondary | ICD-10-CM

## 2014-09-17 DIAGNOSIS — Z9181 History of falling: Secondary | ICD-10-CM

## 2014-09-17 LAB — BASIC METABOLIC PANEL WITH GFR
BUN: 15 mg/dL (ref 6–23)
CALCIUM: 9.3 mg/dL (ref 8.4–10.5)
CHLORIDE: 94 meq/L — AB (ref 96–112)
CO2: 28 mEq/L (ref 19–32)
Creat: 0.55 mg/dL (ref 0.50–1.35)
GFR, Est African American: >89 mL/min
GFR, Est Non African American: >89 mL/min
GLUCOSE: 107 mg/dL — AB (ref 70–99)
Potassium: 4.4 mEq/L (ref 3.5–5.3)
Sodium: 130 mEq/L — ABNORMAL LOW (ref 135–145)

## 2014-09-17 LAB — CBC WITH DIFFERENTIAL/PLATELET
Basophils Absolute: 0 10*3/uL (ref 0.0–0.1)
Basophils Relative: 0 % (ref 0–1)
EOS ABS: 0.1 10*3/uL (ref 0.0–0.7)
EOS PCT: 1 % (ref 0–5)
HCT: 42.2 % (ref 39.0–52.0)
Hemoglobin: 14.1 g/dL (ref 13.0–17.0)
LYMPHS ABS: 2 10*3/uL (ref 0.7–4.0)
Lymphocytes Relative: 30 % (ref 12–46)
MCH: 31.4 pg (ref 26.0–34.0)
MCHC: 33.4 g/dL (ref 30.0–36.0)
MCV: 94 fL (ref 78.0–100.0)
MONOS PCT: 7 % (ref 3–12)
MPV: 9.7 fL (ref 8.6–12.4)
Monocytes Absolute: 0.5 10*3/uL (ref 0.1–1.0)
NEUTROS PCT: 62 % (ref 43–77)
Neutro Abs: 4 10*3/uL (ref 1.7–7.7)
PLATELETS: 218 10*3/uL (ref 150–400)
RBC: 4.49 MIL/uL (ref 4.22–5.81)
RDW: 13.1 % (ref 11.5–15.5)
WBC: 6.5 10*3/uL (ref 4.0–10.5)

## 2014-09-17 LAB — HEPATIC FUNCTION PANEL
ALBUMIN: 4.1 g/dL (ref 3.5–5.2)
ALT: 16 U/L (ref 0–53)
AST: 16 U/L (ref 0–37)
Alkaline Phosphatase: 44 U/L (ref 39–117)
Bilirubin, Direct: 0.1 mg/dL (ref 0.0–0.3)
Indirect Bilirubin: 0.6 mg/dL (ref 0.2–1.2)
Total Bilirubin: 0.7 mg/dL (ref 0.2–1.2)
Total Protein: 6.5 g/dL (ref 6.0–8.3)

## 2014-09-17 LAB — LIPID PANEL
CHOL/HDL RATIO: 3 ratio
CHOLESTEROL: 166 mg/dL (ref 0–200)
HDL: 56 mg/dL (ref >39)
LDL Cholesterol: 91 mg/dL (ref 0–99)
Triglycerides: 95 mg/dL (ref <150)
VLDL: 19 mg/dL (ref 0–40)

## 2014-09-17 LAB — TSH: TSH: 1.275 u[IU]/mL (ref 0.350–4.500)

## 2014-09-17 LAB — HEMOGLOBIN A1C
Hgb A1c MFr Bld: 5.5 % (ref <5.7)
MEAN PLASMA GLUCOSE: 111 mg/dL (ref <117)

## 2014-09-17 LAB — MAGNESIUM: Magnesium: 1.6 mg/dL (ref 1.5–2.5)

## 2014-09-17 MED ORDER — PREDNISONE 10 MG PO TABS: ORAL_TABLET | ORAL | Status: DC

## 2014-09-17 NOTE — Progress Notes (Signed)
Assessment and Plan:  Hypertension: Continue medication, monitor blood pressure at home. Continue DASH diet.  Reminder to go to the ER if any CP, SOB, nausea, dizziness, severe HA, changes vision/speech, left arm numbness and tingling, and jaw pain. Cholesterol: Continue diet and exercise. Check cholesterol.  Pre-diabetes-Continue diet and exercise. Check A1C Vitamin D Def- check level and continue medications.  Spinal stenosis/chronic pain- prednisone 10mg , 1 pill daily for 1 month then 1/2 pill daily, understands harm of long term use of steroids but prefers quality of life.   Continue diet and meds as discussed. Further disposition pending results of labs.  HPI 81 y.o. male  presents for 3 month follow up with hypertension, hyperlipidemia, prediabetes and vitamin D.  His blood pressure has been controlled at home, today their BP is BP: (!) 142/74 mmHg  He does not workout. He denies chest pain, shortness of breath, dizziness.   He is on cholesterol medication, lipitor 40mg  and denies myalgias. His cholesterol is at goal. The cholesterol last visit was:   Lab Results  Component Value Date   CHOL 178 06/08/2014   HDL 56 06/08/2014   LDLCALC 95 06/08/2014   TRIG 133 06/08/2014   CHOLHDL 3.2 06/08/2014   He has been working on diet and exercise for prediabetes, and denies polydipsia, polyuria and visual disturbances. Last A1C in the office was:  Lab Results  Component Value Date   HGBA1C 5.7* 06/08/2014  He uses a power wheel chair unless he is in the house and then he walks. He has chronic pain in his knees, ankles, and hips. Has seen Dr. Jacelyn Grip in the past and has had injections which has helped. He is also on low dose steroids which he states has helped. Has severe spinal stenosis, had lumbar MRI in august.  Patient is on Vitamin D supplement.   Lab Results  Component Value Date   VD25OH 86 06/08/2014     Current Medications:  Current Outpatient Prescriptions on File Prior to Visit   Medication Sig Dispense Refill  . atenolol (TENORMIN) 100 MG tablet Take 100 mg by mouth daily.    Marland Kitchen atorvastatin (LIPITOR) 40 MG tablet Take 40 mg by mouth daily.      . Cholecalciferol (VITAMIN D PO) Take 1 tablet by mouth daily.    Marland Kitchen doxazosin (CARDURA) 8 MG tablet Take 8 mg by mouth at bedtime.    . fish oil-omega-3 fatty acids 1000 MG capsule Take 1 g by mouth daily.    Marland Kitchen lisinopril (PRINIVIL,ZESTRIL) 40 MG tablet Take 20-40 mg by mouth 2 (two) times daily. Take 1/2 a tablet in the morning and 1 tablet in the evening    . Magnesium 250 MG TABS Take 250 mg by mouth daily.    . ondansetron (ZOFRAN) 4 MG tablet Take 1 tablet (4 mg total) by mouth every 6 (six) hours as needed. 20 tablet 0   No current facility-administered medications on file prior to visit.   Medical History:  Past Medical History  Diagnosis Date  . Hypertension   . Hyperlipidemia   . Prediabetes   . Arthritis   . GERD (gastroesophageal reflux disease)   . BPH (benign prostatic hyperplasia)   . Vitamin D deficiency   . Gout    Allergies: No Known Allergies   Review of Systems:  Review of Systems  Constitutional: Negative.   HENT: Negative.   Eyes: Negative.   Respiratory: Negative.   Cardiovascular: Negative.   Gastrointestinal: Negative.   Genitourinary:  Negative.   Musculoskeletal: Positive for myalgias, back pain, joint pain and falls. Negative for neck pain.  Skin: Negative.   Neurological: Negative.   Endo/Heme/Allergies: Negative.   Psychiatric/Behavioral: Negative.     Family history- Review and unchanged Social history- Review and unchanged Physical Exam: BP 142/74 mmHg  Pulse 60  Temp(Src) 97.9 F (36.6 C)  Resp 16  Ht 5\' 11"  (1.803 m)  Wt 217 lb (98.431 kg)  BMI 30.28 kg/m2 Wt Readings from Last 3 Encounters:  09/17/14 217 lb (98.431 kg)  06/08/14 217 lb 3.2 oz (98.521 kg)  03/04/14 215 lb (97.523 kg)   General Appearance: Well nourished, in no apparent distress. Eyes: PERRLA,  EOMs, conjunctiva no swelling or erythema Sinuses: No Frontal/maxillary tenderness ENT/Mouth: Ext aud canals clear, TMs without erythema, bulging. No erythema, swelling, or exudate on post pharynx.  Tonsils not swollen or erythematous. Hearing decreased Neck: Supple, thyroid normal.  Respiratory: Respiratory effort normal, BS equal bilaterally without rales, rhonchi, wheezing or stridor.  Cardio: RRR with no MRGs. Brisk peripheral pulses without edema.  Abdomen: Soft, + BS.  Non tender, no guarding, rebound, hernias, masses. Lymphatics: Non tender without lymphadenopathy.  Musculoskeletal: Full ROM, 4/5 strength, in electric scooter due to leg weakness/pain Skin: Warm, dry without rashes, lesions, ecchymosis.  Neuro: Cranial nerves intact. Normal muscle tone, no cerebellar symptoms.  Psych: Awake and oriented X 3, normal affect, Insight and Judgment appropriate.    Vicie Mutters, PA-C 1:40 PM [facility]

## 2014-09-17 NOTE — Patient Instructions (Signed)
Spinal Stenosis Spinal stenosis is an abnormal narrowing of the canals of your spine (vertebrae). CAUSES  Spinal stenosis is caused by areas of bone pushing into the central canals of your vertebrae. This condition can be present at birth (congenital). It also may be caused by arthritic deterioration of your vertebrae (spinal degeneration).  SYMPTOMS   Pain that is generally worse with activities, particularly standing and walking.  Numbness, tingling, hot or cold sensations, weakness, or weariness in your legs.  Frequent episodes of falling.  A foot-slapping gait that leads to muscle weakness. DIAGNOSIS  Spinal stenosis is diagnosed with the use of magnetic resonance imaging (MRI) or computed tomography (CT). TREATMENT  Initial therapy for spinal stenosis focuses on the management of the pain and other symptoms associated with the condition. These therapies include:  Practicing postural changes to lessen pressure on your nerves.  Exercises to strengthen the core of your body.  Loss of excess body weight.  The use of nonsteroidal anti-inflammatory medicines to reduce swelling and inflammation in your nerves. When therapies to manage pain are not successful, surgery to treat spinal stenosis may be recommended. This surgery involves removing excess bone, which puts pressure on your nerve roots. During this surgery (laminectomy), the posterior boney arch (lamina) and excess bone around the facet joints are removed. Document Released: 10/28/2003 Document Revised: 12/22/2013 Document Reviewed: 11/15/2012 [employer] Patient Information 2015 [employer]. This information is not intended to replace advice given to you by your health care provider. Make sure you discuss any questions you have with your health care provider.  

## 2014-11-25 ENCOUNTER — Ambulatory Visit (INDEPENDENT_AMBULATORY_CARE_PROVIDER_SITE_OTHER): Payer: Medicare Other | Admitting: Internal Medicine

## 2014-11-25 VITALS — BP 132/66 | HR 62 | Temp 98.2°F | Resp 16 | Ht 71.0 in

## 2014-11-25 DIAGNOSIS — M25562 Pain in left knee: Secondary | ICD-10-CM

## 2014-11-25 DIAGNOSIS — M25561 Pain in right knee: Secondary | ICD-10-CM

## 2014-11-25 DIAGNOSIS — M4806 Spinal stenosis, lumbar region: Secondary | ICD-10-CM | POA: Diagnosis not present

## 2014-11-25 DIAGNOSIS — M48062 Spinal stenosis, lumbar region with neurogenic claudication: Secondary | ICD-10-CM

## 2014-11-25 MED ORDER — PREDNISONE 10 MG PO TABS: ORAL_TABLET | ORAL | Status: DC

## 2014-11-25 MED ORDER — TRAMADOL HCL 50 MG PO TABS: 50.0000 mg | ORAL_TABLET | Freq: Four times a day (QID) | ORAL | Status: DC | PRN

## 2014-11-25 NOTE — Patient Instructions (Signed)
PLEASE TAKE 20 MG OF PREDNISONE DAILY FOR 1 WEEK.  YOU CAN TAKE TRAMADOL AS NEEDED FOR PAIN EVERY 6-8 HOURS. IF YOU HAVE NO IMPROVEMENT PLEASE CALL ME AND WE WILL TRY TO GET YOU IN TO SEE AND ORTHOPEDIST.    Knee Pain The knee is the complex joint between your thigh and your lower leg. It is made up of bones, tendons, ligaments, and cartilage. The bones that make up the knee are:  The femur in the thigh.  The tibia and fibula in the lower leg.  The patella or kneecap riding in the groove on the lower femur. CAUSES  Knee pain is a common complaint with many causes. A few of these causes are:  Injury, such as:  A ruptured ligament or tendon injury.  Torn cartilage.  Medical conditions, such as:  Gout  Arthritis  Infections  Overuse, over training, or overdoing a physical activity. Knee pain can be minor or severe. Knee pain can accompany debilitating injury. Minor knee problems often respond well to self-care measures or get well on their own. More serious injuries may need medical intervention or even surgery. SYMPTOMS The knee is complex. Symptoms of knee problems can vary widely. Some of the problems are:  Pain with movement and weight bearing.  Swelling and tenderness.  Buckling of the knee.  Inability to straighten or extend your knee.  Your knee locks and you cannot straighten it.  Warmth and redness with pain and fever.  Deformity or dislocation of the kneecap. DIAGNOSIS  Determining what is wrong may be very straight forward such as when there is an injury. It can also be challenging because of the complexity of the knee. Tests to make a diagnosis may include:  Your caregiver taking a history and doing a physical exam.  Routine X-rays can be used to rule out other problems. X-rays will not reveal a cartilage tear. Some injuries of the knee can be diagnosed by:  Arthroscopy a surgical technique by which a small video camera is inserted through tiny incisions  on the sides of the knee. This procedure is used to examine and repair internal knee joint problems. Tiny instruments can be used during arthroscopy to repair the torn knee cartilage (meniscus).  Arthrography is a radiology technique. A contrast liquid is directly injected into the knee joint. Internal structures of the knee joint then become visible on X-ray film.  An MRI scan is a non X-ray radiology procedure in which magnetic fields and a computer produce two- or three-dimensional images of the inside of the knee. Cartilage tears are often visible using an MRI scanner. MRI scans have largely replaced arthrography in diagnosing cartilage tears of the knee.  Blood work.  Examination of the fluid that helps to lubricate the knee joint (synovial fluid). This is done by taking a sample out using a needle and a syringe. TREATMENT The treatment of knee problems depends on the cause. Some of these treatments are:  Depending on the injury, proper casting, splinting, surgery, or physical therapy care will be needed.  Give yourself adequate recovery time. Do not overuse your joints. If you begin to get sore during workout routines, back off. Slow down or do fewer repetitions.  For repetitive activities such as cycling or running, maintain your strength and nutrition.  Alternate muscle groups. For example, if you are a weight lifter, work the upper body on one day and the lower body the next.  Either tight or weak muscles do not give  the proper support for your knee. Tight or weak muscles do not absorb the stress placed on the knee joint. Keep the muscles surrounding the knee strong.  Take care of mechanical problems.  If you have flat feet, orthotics or special shoes may help. See your caregiver if you need help.  Arch supports, sometimes with wedges on the inner or outer aspect of the heel, can help. These can shift pressure away from the side of the knee most bothered by osteoarthritis.  A  brace called an "unloader" brace also may be used to help ease the pressure on the most arthritic side of the knee.  If your caregiver has prescribed crutches, braces, wraps or ice, use as directed. The acronym for this is PRICE. This means protection, rest, ice, compression, and elevation.  Nonsteroidal anti-inflammatory drugs (NSAIDs), can help relieve pain. But if taken immediately after an injury, they may actually increase swelling. Take NSAIDs with food in your stomach. Stop them if you develop stomach problems. Do not take these if you have a history of ulcers, stomach pain, or bleeding from the bowel. Do not take without your caregiver's approval if you have problems with fluid retention, heart failure, or kidney problems.  For ongoing knee problems, physical therapy may be helpful.  Glucosamine and chondroitin are over-the-counter dietary supplements. Both may help relieve the pain of osteoarthritis in the knee. These medicines are different from the usual anti-inflammatory drugs. Glucosamine may decrease the rate of cartilage destruction.  Injections of a corticosteroid drug into your knee joint may help reduce the symptoms of an arthritis flare-up. They may provide pain relief that lasts a few months. You may have to wait a few months between injections. The injections do have a small increased risk of infection, water retention, and elevated blood sugar levels.  Hyaluronic acid injected into damaged joints may ease pain and provide lubrication. These injections may work by reducing inflammation. A series of shots may give relief for as long as 6 months.  Topical painkillers. Applying certain ointments to your skin may help relieve the pain and stiffness of osteoarthritis. Ask your pharmacist for suggestions. Many over the-counter products are approved for temporary relief of arthritis pain.  In some countries, doctors often prescribe topical NSAIDs for relief of chronic conditions such as  arthritis and tendinitis. A review of treatment with NSAID creams found that they worked as well as oral medications but without the serious side effects. PREVENTION  Maintain a healthy weight. Extra pounds put more strain on your joints.  Get strong, stay limber. Weak muscles are a common cause of knee injuries. Stretching is important. Include flexibility exercises in your workouts.  Be smart about exercise. If you have osteoarthritis, chronic knee pain or recurring injuries, you may need to change the way you exercise. This does not mean you have to stop being active. If your knees ache after jogging or playing basketball, consider switching to swimming, water aerobics, or other low-impact activities, at least for a few days a week. Sometimes limiting high-impact activities will provide relief.  Make sure your shoes fit well. Choose footwear that is right for your sport.  Protect your knees. Use the proper gear for knee-sensitive activities. Use kneepads when playing volleyball or laying carpet. Buckle your seat belt every time you drive. Most shattered kneecaps occur in car accidents.  Rest when you are tired. SEEK MEDICAL CARE IF:  You have knee pain that is continual and does not seem to be getting  better.  SEEK IMMEDIATE MEDICAL CARE IF:  Your knee joint feels hot to the touch and you have a high fever. MAKE SURE YOU:   Understand these instructions.  Will watch your condition.  Will get help right away if you are not doing well or get worse. Document Released: 06/04/2007 Document Revised: 10/30/2011 Document Reviewed: 06/04/2007 [facility] Patient Information 2015 [place], [place]. This information is not intended to replace advice given to you by your health care provider. Make sure you discuss any questions you have with your health care provider.

## 2014-11-25 NOTE — Progress Notes (Signed)
Subjective:    Patient ID: Tracy Brandt, male    DOB: 1929/12/07, 81 y.o.   MRN: [id number]  Knee Pain    Patient reports that he has been having very severe knee pain x 1 month.  Patient reports that he has been having some pain in the bilateral knees.  He reports that his knees have been getting red and swollen and they sting when he is up and walking around in the house.  He reports that he has had shots in them in the past. He reports that he has been taking prednisone at home and has also been using some creams at home which are not helping.  He reports that his skin feels like a bee sting.  He has never had injections in his knees.  He reports that he has been taking 5 mg tablet every day for his knee pain of prednisone.  He does also report taking some ibuprofen.     Review of Systems  Constitutional: Negative for fever, chills and fatigue.  Respiratory: Negative for chest tightness and shortness of breath.   Musculoskeletal: Positive for joint swelling and gait problem.  Skin: Positive for color change.  Neurological: Negative for dizziness.       Objective:   Physical Exam  Constitutional: He is oriented to person, place, and time. He appears well-developed and well-nourished. No distress.  HENT:  Head: Normocephalic and atraumatic.  Mouth/Throat: Oropharynx is clear and moist. No oropharyngeal exudate.  Eyes: Conjunctivae are normal. Pupils are equal, round, and reactive to light. No scleral icterus.  Neck: Normal range of motion. Neck supple. No JVD present. No thyromegaly present.  Cardiovascular: Normal rate, normal heart sounds and intact distal pulses.  An irregular rhythm present. Exam reveals no gallop and no friction rub.   No murmur heard. Pulmonary/Chest: Effort normal and breath sounds normal. No respiratory distress. He has no wheezes. He has no rales. He exhibits no tenderness.  Musculoskeletal:       Right knee: He exhibits decreased range of motion,  swelling (minimal) and bony tenderness. He exhibits no effusion, no ecchymosis, no deformity, no laceration, no erythema, normal alignment, no LCL laxity, normal patellar mobility, normal meniscus and no MCL laxity. Tenderness found. Medial joint line and lateral joint line tenderness noted. No MCL, no LCL and no patellar tendon tenderness noted.       Left knee: He exhibits decreased range of motion, swelling and bony tenderness. He exhibits no effusion, no ecchymosis, no deformity, no laceration, no erythema, normal alignment, no LCL laxity, normal patellar mobility, normal meniscus and no MCL laxity. Tenderness found. Medial joint line and lateral joint line tenderness noted.  Positive patellar grind test bilaterally.    Lymphadenopathy:    He has no cervical adenopathy.  Neurological: He is alert and oriented to person, place, and time.  Skin: Skin is warm and dry. He is not diaphoretic.  Psychiatric: He has a normal mood and affect. His behavior is normal. Judgment and thought content normal.          Assessment & Plan:    1. Bilateral knee pain -exam most consistent with osteoarthritis.  NO evidence of septic joint.  Given exam gout possible but less likely.  Will increase prednisone to 20 mg daily x 1 week.  If no improvement then we will send to ortho for injections.   -tramadol for severe pain.  2. Spinal stenosis, lumbar region, with neurogenic claudication -refill given - predniSONE (DELTASONE)  10 MG tablet; 1/2-1 pill daily with dinner for joint pain  Dispense: 90 tablet; Refill: 1

## 2014-12-02 ENCOUNTER — Encounter: Payer: Self-pay | Admitting: Internal Medicine

## 2014-12-30 ENCOUNTER — Ambulatory Visit (INDEPENDENT_AMBULATORY_CARE_PROVIDER_SITE_OTHER): Payer: Medicare Other | Admitting: Internal Medicine

## 2014-12-30 ENCOUNTER — Encounter: Payer: Self-pay | Admitting: Internal Medicine

## 2014-12-30 VITALS — BP 130/72 | HR 76 | Temp 97.7°F | Resp 16 | Ht 71.0 in | Wt 218.0 lb

## 2014-12-30 DIAGNOSIS — Z125 Encounter for screening for malignant neoplasm of prostate: Secondary | ICD-10-CM

## 2014-12-30 DIAGNOSIS — Z1212 Encounter for screening for malignant neoplasm of rectum: Secondary | ICD-10-CM

## 2014-12-30 DIAGNOSIS — E559 Vitamin D deficiency, unspecified: Secondary | ICD-10-CM | POA: Diagnosis not present

## 2014-12-30 DIAGNOSIS — E785 Hyperlipidemia, unspecified: Secondary | ICD-10-CM | POA: Diagnosis not present

## 2014-12-30 DIAGNOSIS — Z9181 History of falling: Secondary | ICD-10-CM

## 2014-12-30 DIAGNOSIS — M159 Polyosteoarthritis, unspecified: Secondary | ICD-10-CM

## 2014-12-30 DIAGNOSIS — Z79899 Other long term (current) drug therapy: Secondary | ICD-10-CM

## 2014-12-30 DIAGNOSIS — K219 Gastro-esophageal reflux disease without esophagitis: Secondary | ICD-10-CM

## 2014-12-30 DIAGNOSIS — I10 Essential (primary) hypertension: Secondary | ICD-10-CM

## 2014-12-30 DIAGNOSIS — R296 Repeated falls: Secondary | ICD-10-CM

## 2014-12-30 DIAGNOSIS — M109 Gout, unspecified: Secondary | ICD-10-CM | POA: Diagnosis not present

## 2014-12-30 DIAGNOSIS — R972 Elevated prostate specific antigen [PSA]: Secondary | ICD-10-CM

## 2014-12-30 DIAGNOSIS — R269 Unspecified abnormalities of gait and mobility: Secondary | ICD-10-CM

## 2014-12-30 DIAGNOSIS — M48062 Spinal stenosis, lumbar region with neurogenic claudication: Secondary | ICD-10-CM

## 2014-12-30 DIAGNOSIS — Z1331 Encounter for screening for depression: Secondary | ICD-10-CM

## 2014-12-30 DIAGNOSIS — R7309 Other abnormal glucose: Secondary | ICD-10-CM | POA: Diagnosis not present

## 2014-12-30 DIAGNOSIS — R7303 Prediabetes: Secondary | ICD-10-CM

## 2014-12-30 DIAGNOSIS — N400 Enlarged prostate without lower urinary tract symptoms: Secondary | ICD-10-CM

## 2014-12-30 LAB — CBC WITH DIFFERENTIAL/PLATELET
BASOS ABS: 0 10*3/uL (ref 0.0–0.1)
Basophils Relative: 0 % (ref 0–1)
EOS ABS: 0.1 10*3/uL (ref 0.0–0.7)
EOS PCT: 1 % (ref 0–5)
HCT: 43.1 % (ref 39.0–52.0)
Hemoglobin: 14.6 g/dL (ref 13.0–17.0)
LYMPHS ABS: 2.6 10*3/uL (ref 0.7–4.0)
Lymphocytes Relative: 38 % (ref 12–46)
MCH: 32.1 pg (ref 26.0–34.0)
MCHC: 33.9 g/dL (ref 30.0–36.0)
MCV: 94.7 fL (ref 78.0–100.0)
MPV: 9.2 fL (ref 8.6–12.4)
Monocytes Absolute: 0.5 10*3/uL (ref 0.1–1.0)
Monocytes Relative: 7 % (ref 3–12)
Neutro Abs: 3.7 10*3/uL (ref 1.7–7.7)
Neutrophils Relative %: 54 % (ref 43–77)
PLATELETS: 225 10*3/uL (ref 150–400)
RBC: 4.55 MIL/uL (ref 4.22–5.81)
RDW: 12.7 % (ref 11.5–15.5)
WBC: 6.9 10*3/uL (ref 4.0–10.5)

## 2014-12-30 NOTE — Progress Notes (Signed)
Patient ID: Tracy Brandt, male   DOB: 1930-02-28, 81 y.o.   MRN: [id number]   Annual Comprehensive Examination  This very nice 81 y.o. WWM presents for complete physical.  Patient has been followed for HTN, Prediabetes, Hyperlipidemia, and Vitamin D Deficiency.Patient also has c/o joint aches and hx/o gout and along with chronic knee pains alleges he is unable to stand and take more than a few steps.   He has been dx'd with a  Peripheral neuropathy and balance disorder with a negative W/U at the [place]. He had a negative CTs Head in in 2011 and neg PNCV's and EMG as well as neg studies for myasthenia gravis. [facility] and also remodeled his home bathroom to make it more safety compliant. At home he use a walker. He relates that he has had several falls over the last year w/o major injury.   HTN predates since 37. Patient's BP has been controlled at home.Today's BP: 130/72 mmHg. Patient denies any cardiac symptoms as chest pain, palpitations, shortness of breath, dizziness or ankle swelling.   Patient's hyperlipidemia is controlled with diet and medications. Patient denies myalgias or other medication SE's. Last lipids were 09/17/2014: Cholesterol, Total 166; HDL-C 56; LDL (calc) 91; Triglycerides 95 on      Patient has prediabetes since Mar 2011 with A1c 6.0% and patient denies reactive hypoglycemic symptoms, visual blurring, diabetic polys or paresthesias. Last A1c was 5.5% on1/28/2016.        Finally, patient has history of Vitamin D Deficiency of "70" in 2008 and last vitamin D was  86 on 06/08/2014.     Medication Sig  . atorvastatin (LIPITOR) 40 MG tablet Take 40 mg by mouth daily.    . Cholecalciferol (VITAMIN D PO) Take 1 tablet by mouth daily.  Marland Kitchen doxazosin (CARDURA) 8 MG tablet Take 8 mg by mouth at bedtime.  . fish oil-omega-3 fatty acids 1000 MG capsule Take 1 g by mouth daily.  Marland Kitchen lisinopril (PRINIVIL,ZESTRIL) 40 MG tablet Take  20-40 mg by mouth 2 (two) times daily. Take 1/2 a tablet in the morning and 1 tablet in the evening  . Magnesium 250 MG TABS Take 250 mg by mouth daily.  . ondansetron (ZOFRAN) 4 MG tablet Take 1 tablet (4 mg total) by mouth every 6 (six) hours as needed.  . predniSONE (DELTASONE) 10 MG tablet 1/2-1 pill daily with dinner for joint pain  . traMADol (ULTRAM) 50 MG tablet Take 1 tablet (50 mg total) by mouth every 6 (six) hours as needed.  Marland Kitchen atenolol (TENORMIN) 100 MG tablet Take 100 mg by mouth daily.   No Known Allergies   Past Medical History  Diagnosis Date  . Hypertension   . Hyperlipidemia   . Prediabetes   . Arthritis   . GERD (gastroesophageal reflux disease)   . BPH (benign prostatic hyperplasia)   . Vitamin D deficiency   . Gout    Health Maintenance  Topic Date Due  . COLONOSCOPY  06/08/1982  . ZOSTAVAX  06/08/1992  . INFLUENZA VACCINE  03/22/2015  . TETANUS/TDAP  07/21/2015  . PNA vac Low Risk Adult  Completed   Immunization History  Administered Date(s) Administered  . Influenza, High Dose Seasonal PF 06/08/2014  . Pneumococcal Conjugate-13 05/25/2014  . Pneumococcal Polysaccharide-23 05/28/2013  . Td 07/20/2005   Past Surgical History  Procedure Laterality Date  . Back surgery    . Leg surgery    .  Hemorrhoid surgery    . Eye surgery     Family History  Problem Relation Age of Onset  . Heart disease Mother   . Hypertension Mother   . Heart disease Father    History   Social History  . Marital Status: Widowed    Spouse Name: N/A  . Number of Children: N/A  . Years of Education: N/A   Occupational History  . Ret shift supervisor 1994 from [employer]..   Social History Main Topics  . Smoking status: Never Smoker   . Smokeless tobacco: Not on file  . Alcohol Use: No  . Drug Use: No  . Sexual Activity: Not on file    ROS Constitutional: Denies fever, chills, weight loss/gain, headaches, insomnia,  night sweats or change in appetite. Does c/o  fatigue. Eyes: Denies redness, blurred vision, diplopia, discharge, itchy or watery eyes.  ENT: Denies discharge, congestion, post nasal drip, epistaxis, sore throat, earache, hearing loss, dental pain, Tinnitus, Vertigo, Sinus pain or snoring.  Cardio: Denies chest pain, palpitations, irregular heartbeat, syncope, dyspnea, diaphoresis, orthopnea, PND, claudication or edema Respiratory: denies cough, dyspnea, DOE, pleurisy, hoarseness, laryngitis or wheezing.  Gastrointestinal: Denies dysphagia, heartburn, reflux, water brash, pain, cramps, nausea, vomiting, bloating, diarrhea, constipation, hematemesis, melena, hematochezia, jaundice or hemorrhoids Genitourinary: Denies dysuria, frequency, urgency, nocturia, hesitancy, discharge, hematuria or flank pain Musculoskeletal: describes unable to stand independently or walk w/o holding on something. . Skin: Denies puritis, rash, hives, warts, acne, eczema or change in skin lesion Neuro: No weakness, tremor, incoordination, spasms, paresthesia or pain Psychiatric: Denies confusion, memory loss or sensory loss. Denies Depression. Endocrine: Denies change in weight, skin, hair change, nocturia, and paresthesia, diabetic polys, visual blurring or hyper / hypo glycemic episodes.  Heme/Lymph: No excessive bleeding, bruising or enlarged lymph nodes.  Physical Exam  BP 130/72   Pulse 76  Temp 97.7 F Resp 16  Ht 5\' 11"    Wt 218 lb     BMI 30.42  General Appearance: Well nourished, in no apparent distress. Eyes: PERRLA, EOMs, conjunctiva no swelling or erythema, normal fundi and vessels. Sinuses: No frontal/maxillary tenderness ENT/Mouth: EACs patent / TMs  nl. Nares clear without erythema, swelling, mucoid exudates. Oral hygiene is good. No erythema, swelling, or exudate. Tongue normal, non-obstructing. Tonsils not swollen or erythematous. Hearing normal.  Neck: Supple, thyroid normal. No bruits, nodes or JVD. Respiratory: Respiratory effort normal.   BS equal and clear bilateral without rales, rhonci, wheezing or stridor. Cardio: Heart sounds are normal with regular rate and rhythm and no murmurs, rubs or gallops. Peripheral pulses are normal and equal bilaterally without edema. No aortic or femoral bruits. Chest: symmetric with normal excursions and percussion.  Abdomen: Flat, soft, with bowel sounds. Nontender, no guarding, rebound, hernias, masses, or organomegaly.  Lymphatics: Non tender without lymphadenopathy.  Genitourinary: No hernias.Testes nl. DRE - prostate nl for age - smooth & firm w/o nodules. Musculoskeletal: Patient in a wheel chair . DTR's of LE absent as there is a decreased distal sensory below the knees  to pin, monofilament and touch. LE's are semi-flaccid.  Skin: Warm and dry without rashes, lesions, cyanosis, clubbing or  ecchymosis.  Neuro: Cranial nerves intact, reflexes equal bilaterally. Normal muscle tone, no cerebellar symptoms. Sensation intact.  Pysch: Awake and oriented X 3 with normal affect, insight and judgment appropriate.   Assessment and Plan  1. Essential hypertension  - Microalbumin / creatinine urine ratio - EKG 12-Lead - [place], RETROPERITNL ABD,  LTD - TSH  2. Hyperlipidemia  - Lipid panel  3. Prediabetes  - Hemoglobin A1c - Insulin, random  4. Vitamin D deficiency  - Vit D  25 hydroxy   5. Gout  - Uric acid  6. Gastroesophageal reflux disease   7. Morbid obesity   8. Spinal stenosis, lumbar, with neurogenic claudication   9. Abnormality of gait   10. BPH   11. DJD   12. Screening for rectal cancer  - POC Hemoccult Bld/Stl   13. Prostate cancer screening   14. Depression screen  - Screen Negative  15. At moderate risk for fall   16. Medication management  - Urine Microscopic - CBC with Differential/Platelet - BASIC METABOLIC PANEL WITH GFR - Hepatic function panel - Magnesium  17. Elevated PSA  - PSA     Continue prudent diet as discussed,  weight control, BP monitoring, regular exercise, and medications as discussed.  Discussed med effects and SE's. Routine screening labs and tests as requested with regular follow-up as recommended. Over 40 minutes of exam, counseling &  chart review was performed

## 2014-12-30 NOTE — Patient Instructions (Signed)
++++++++++++++++++++++++++++++++++  Recommend Low dose or baby Aspirin 81 mg daily   To reduce risk of Colon Cancer 20 %, Skin Cancer 26 % , Melanoma 46% and   Pancreatic cancer 60%  +++++++++++++++++++++++++++++++++ Vitamin D goal is between 70-100.   Please make sure that you are taking your Vitamin D as directed.   It is very important as a natural antiinflammatory   helping hair, skin, and nails, as well as reducing stroke and heart attack risk.   It helps your bones and helps with mood.  It also decreases numerous cancer risks so please take it as directed.   Low Vit D is associated with a 200-300% higher risk for CANCER   and 200-300% higher risk for HEART   ATTACK  &  STROKE.    ...................................................................................  It is also associated with higher death rate at younger ages,   autoimmune diseases like Rheumatoid arthritis, Lupus, Multiple Sclerosis.     Also many other serious conditions, like depression, Alzheimer's  Dementia, infertility, muscle aches, fatigue, fibromyalgia - just to name a few.  ++++++++++++++++++++++++++++++++++++++++ Recommend the book "The END of DIETING" by Dr Joel Fuhrman   & the book "The END of DIABETES " by Dr Joel Fuhrman  At [employer] - get book & Audio CD's     Being diabetic has a  300% increased risk for heart attack, stroke, cancer, and alzheimer- type vascular dementia. It is very important that you work harder with diet by avoiding all foods that are white. Avoid white rice (brown & wild rice is OK), white potatoes (sweetpotatoes in moderation is OK), White bread or wheat bread or anything made out of white flour like bagels, donuts, rolls, buns, biscuits, cakes, pastries, cookies, pizza crust, and pasta (made from white flour & egg whites) - vegetarian pasta or spinach or wheat pasta is OK. Multigrain breads like Arnold's or [employer], or multigrain sandwich thins or  flatbreads.  Diet, exercise and weight loss can reverse and cure diabetes in the early stages.  Diet, exercise and weight loss is very important in the control and prevention of complications of diabetes which affects every system in your body, ie. Brain - dementia/stroke, eyes - glaucoma/blindness, heart - heart attack/heart failure, kidneys - dialysis, stomach - gastric paralysis, intestines - malabsorption, nerves - severe painful neuritis, circulation - gangrene & loss of a leg(s), and finally cancer and Alzheimers.    I recommend avoid fried & greasy foods,  sweets/candy, white rice (brown or wild rice or Quinoa is OK), white potatoes (sweet potatoes are OK) - anything made from white flour - bagels, doughnuts, rolls, buns, biscuits,white and wheat breads, pizza crust and traditional pasta made of white flour & egg white(vegetarian pasta or spinach or wheat pasta is OK).  Multi-grain bread is OK - like multi-grain flat bread or sandwich thins. Avoid alcohol in excess. Exercise is also important.    Eat all the vegetables you want - avoid meat, especially red meat and dairy - especially cheese.  Cheese is the most concentrated form of trans-fats which is the worst thing to clog up our arteries. Veggie cheese is OK which can be found in the fresh produce section at [employer] or [employer] or [employer]  ++++++++++++++++++++++++++++++++++++++++++++++++++++++++ Preventive Care for Adults A healthy lifestyle and preventive care can promote health and wellness. Preventive health guidelines for men include the following key practices:  A routine yearly physical is a good way to check with your health care provider about your   health and preventative screening. It is a chance to share any concerns and updates on your health and to receive a thorough exam.  Visit your dentist for a routine exam and preventative care every 6 months. Brush your teeth twice a day and floss once a day. Good oral hygiene  prevents tooth decay and gum disease.  The frequency of eye exams is based on your age, health, family medical history, use of contact lenses, and other factors. Follow your health care provider's recommendations for frequency of eye exams.  Eat a healthy diet. Foods such as vegetables, fruits, whole grains, low-fat dairy products, and lean protein foods contain the nutrients you need without too many calories. Decrease your intake of foods high in solid fats, added sugars, and salt. Eat the right amount of calories for you.Get information about a proper diet from your health care provider, if necessary.  Regular physical exercise is one of the most important things you can do for your health. Most adults should get at least 150 minutes of moderate-intensity exercise (any activity that increases your heart rate and causes you to sweat) each week. In addition, most adults need muscle-strengthening exercises on 2 or more days a week.  Maintain a healthy weight. The body mass index (BMI) is a screening tool to identify possible weight problems. It provides an estimate of body fat based on height and weight. Your health care provider can find your BMI and can help you achieve or maintain a healthy weight.For adults 20 years and older:  A BMI below 18.5 is considered underweight.  A BMI of 18.5 to 24.9 is normal.  A BMI of 25 to 29.9 is considered overweight.  A BMI of 30 and above is considered obese.  Maintain normal blood lipids and cholesterol levels by exercising and minimizing your intake of saturated fat. Eat a balanced diet with plenty of fruit and vegetables. Blood tests for lipids and cholesterol should begin at age 20 and be repeated every 5 years. If your lipid or cholesterol levels are high, you are over 50, or you are at high risk for heart disease, you may need your cholesterol levels checked more frequently.Ongoing high lipid and cholesterol levels should be treated with medicines if  diet and exercise are not working.  If you smoke, find out from your health care provider how to quit. If you do not use tobacco, do not start.  Lung cancer screening is recommended for adults aged 55-80 years who are at high risk for developing lung cancer because of a history of smoking. A yearly low-dose CT scan of the lungs is recommended for people who have at least a 30-pack-year history of smoking and are a current smoker or have quit within the past 15 years. A pack year of smoking is smoking an average of 1 pack of cigarettes a day for 1 year (for example: 1 pack a day for 30 years or 2 packs a day for 15 years). Yearly screening should continue until the smoker has stopped smoking for at least 15 years. Yearly screening should be stopped for people who develop a health problem that would prevent them from having lung cancer treatment.  If you choose to drink alcohol, do not have more than 2 drinks per day. One drink is considered to be 12 ounces (355 mL) of beer, 5 ounces (148 mL) of wine, or 1.5 ounces (44 mL) of liquor.  Avoid use of street drugs. Do not share needles with anyone. Ask   for help if you need support or instructions about stopping the use of drugs.  High blood pressure causes heart disease and increases the risk of stroke. Your blood pressure should be checked at least every 1-2 years. Ongoing high blood pressure should be treated with medicines, if weight loss and exercise are not effective.  If you are 45-79 years old, ask your health care provider if you should take aspirin to prevent heart disease.  Diabetes screening involves taking a blood sample to check your fasting blood sugar level. Testing should be considered at a younger age or be carried out more frequently if you are overweight and have at least 1 risk factor for diabetes.  Colorectal cancer can be detected and often prevented. Most routine colorectal cancer screening begins at the age of 50 and continues  through age 75. However, your health care provider may recommend screening at an earlier age if you have risk factors for colon cancer. On a yearly basis, your health care provider may provide home test kits to check for hidden blood in the stool. Use of a small camera at the end of a tube to directly examine the colon (sigmoidoscopy or colonoscopy) can detect the earliest forms of colorectal cancer. Talk to your health care provider about this at age 50, when routine screening begins. Direct exam of the colon should be repeated every 5-10 years through age 75, unless early forms of precancerous polyps or small growths are found.  Hepatitis C blood testing is recommended for all people born from 1945 through 1965 and any individual with known risks for hepatitis C.  Screening for abdominal aortic aneurysm (AAA)  by ultrasound is recommended for people who have history of high blood pressure or who are current or former smokers.  Healthy men should  receive prostate-specific antigen (PSA) blood tests as part of routine cancer screening. Talk with your health care provider about prostate cancer screening.  Testicular cancer screening is  recommended for adult males. Screening includes self-exam, a health care provider exam, and other screening tests. Consult with your health care provider about any symptoms you have or any concerns you have about testicular cancer.  Use sunscreen. Apply sunscreen liberally and repeatedly throughout the day. You should seek shade when your shadow is shorter than you. Protect yourself by wearing long sleeves, pants, a wide-brimmed hat, and sunglasses year round, whenever you are outdoors.  Once a month, do a whole-body skin exam, using a mirror to look at the skin on your back. Tell your health care provider about new moles, moles that have irregular borders, moles that are larger than a pencil eraser, or moles that have changed in shape or color.  Stay current with  required vaccines (immunizations).  Influenza vaccine. All adults should be immunized every year.  Tetanus, diphtheria, and acellular pertussis (Td, Tdap) vaccine. An adult who has not previously received Tdap or who does not know his vaccine status should receive 1 dose of Tdap. This initial dose should be followed by tetanus and diphtheria toxoids (Td) booster doses every 10 years. Adults with an unknown or incomplete history of completing a 3-dose immunization series with Td-containing vaccines should begin or complete a primary immunization series including a Tdap dose. Adults should receive a Td booster every 10 years.  Zoster vaccine. One dose is recommended for adults aged 60 years or older unless certain conditions are present.    PREVNAR - Pneumococcal 13-valent conjugate (PCV13) vaccine. When indicated, a person who is   uncertain of his immunization history and has no record of immunization should receive the PCV13 vaccine. An adult aged 19 years or older who has certain medical conditions and has not been previously immunized should receive 1 dose of PCV13 vaccine. This PCV13 should be followed with a dose of pneumococcal polysaccharide (PPSV23) vaccine. The PPSV23 vaccine dose should be obtained at least 8 weeks after the dose of PCV13 vaccine. An adult aged 19 years or older who has certain medical conditions and previously received 1 or more doses of PPSV23 vaccine should receive 1 dose of PCV13. The PCV13 vaccine dose should be obtained 1 or more years after the last PPSV23 vaccine dose.    PNEUMOVAX - Pneumococcal polysaccharide (PPSV23) vaccine. When PCV13 is also indicated, PCV13 should be obtained first. All adults aged 65 years and older should be immunized. An adult younger than age 65 years who has certain medical conditions should be immunized. Any person who resides in a nursing home or long-term care facility should be immunized. An adult smoker should be immunized. People with  an immunocompromised condition and certain other conditions should receive both PCV13 and PPSV23 vaccines. People with human immunodeficiency virus (HIV) infection should be immunized as soon as possible after diagnosis. Immunization during chemotherapy or radiation therapy should be avoided. Routine use of PPSV23 vaccine is not recommended for American Indians, [place] Natives, or people younger than 65 years unless there are medical conditions that require PPSV23 vaccine. When indicated, people who have unknown immunization and have no record of immunization should receive PPSV23 vaccine. One-time revaccination 5 years after the first dose of PPSV23 is recommended for people aged 19-64 years who have chronic kidney failure, nephrotic syndrome, asplenia, or immunocompromised conditions. People who received 1-2 doses of PPSV23 before age 65 years should receive another dose of PPSV23 vaccine at age 65 years or later if at least 5 years have passed since the previous dose. Doses of PPSV23 are not needed for people immunized with PPSV23 at or after age 65 years.    Hepatitis A vaccine. Adults who wish to be protected from this disease, have certain high-risk conditions, work with hepatitis A-infected animals, work in hepatitis A research labs, or travel to or work in countries with a high rate of hepatitis A should be immunized. Adults who were previously unvaccinated and who anticipate close contact with an international adoptee during the first 60 days after arrival in the [place] from a country with a high rate of hepatitis A should be immunized.    Hepatitis B vaccine. Adults should be immunized if they wish to be protected from this disease, have certain high-risk conditions, may be exposed to blood or other infectious body fluids, are household contacts or sex partners of hepatitis B positive people, are clients or workers in certain care facilities, or travel to or work in countries with a high  rate of hepatitis B.   Preventive Service / Frequency   Ages 65 and over  Blood pressure check.  Lipid and cholesterol check.  Lung cancer screening. / Every year if you are aged 55-80 years and have a 30-pack-year history of smoking and currently smoke or have quit within the past 15 years. Yearly screening is stopped once you have quit smoking for at least 15 years or develop a health problem that would prevent you from having lung cancer treatment.  Fecal occult blood test (FOBT) of stool. You may not have to do this test if you get a   colonoscopy every 10 years.  Flexible sigmoidoscopy** or colonoscopy.** / Every 5 years for a flexible sigmoidoscopy or every 10 years for a colonoscopy beginning at age 50 and continuing until age 75.  Hepatitis C blood test.** / For all people born from 1945 through 1965 and any individual with known risks for hepatitis C.  Abdominal aortic aneurysm (AAA) screening./ Screening current or former smokers or have Hypertension.  Skin self-exam. / Monthly.  Influenza vaccine. / Every year.  Tetanus, diphtheria, and acellular pertussis (Tdap/Td) vaccine.** / 1 dose of Td every 10 years.   Zoster vaccine.** / 1 dose for adults aged 60 years or older.         Pneumococcal 13-valent conjugate (PCV13) vaccine.    Pneumococcal polysaccharide (PPSV23) vaccine.     Hepatitis A vaccine.** / Consult your health care provider.  Hepatitis B vaccine.** / Consult your health care provider. Screening for abdominal aortic aneurysm (AAA)  by ultrasound is recommended for people who have history of high blood pressure or who are current or former smokers. 

## 2014-12-31 LAB — HEPATIC FUNCTION PANEL
ALK PHOS: 41 U/L (ref 39–117)
ALT: 15 U/L (ref 0–53)
AST: 15 U/L (ref 0–37)
Albumin: 4.1 g/dL (ref 3.5–5.2)
BILIRUBIN DIRECT: 0.2 mg/dL (ref 0.0–0.3)
Indirect Bilirubin: 0.9 mg/dL (ref 0.2–1.2)
Total Bilirubin: 1.1 mg/dL (ref 0.2–1.2)
Total Protein: 6.6 g/dL (ref 6.0–8.3)

## 2014-12-31 LAB — URINALYSIS, MICROSCOPIC ONLY
BACTERIA UA: NONE SEEN
CASTS: NONE SEEN
Crystals: NONE SEEN
Squamous Epithelial / LPF: NONE SEEN

## 2014-12-31 LAB — LIPID PANEL
CHOLESTEROL: 166 mg/dL (ref 0–200)
HDL: 59 mg/dL (ref >=40)
LDL Cholesterol: 80 mg/dL (ref 0–99)
TRIGLYCERIDES: 133 mg/dL (ref <150)
Total CHOL/HDL Ratio: 2.8 Ratio
VLDL: 27 mg/dL (ref 0–40)

## 2014-12-31 LAB — MAGNESIUM: Magnesium: 1.6 mg/dL (ref 1.5–2.5)

## 2014-12-31 LAB — BASIC METABOLIC PANEL WITH GFR
BUN: 15 mg/dL (ref 6–23)
CHLORIDE: 95 meq/L — AB (ref 96–112)
CO2: 26 mEq/L (ref 19–32)
Calcium: 9.5 mg/dL (ref 8.4–10.5)
Creat: 0.59 mg/dL (ref 0.50–1.35)
GFR, Est African American: >89 mL/min
GFR, Est Non African American: >89 mL/min
GLUCOSE: 98 mg/dL (ref 70–99)
Potassium: 4.5 mEq/L (ref 3.5–5.3)
Sodium: 132 mEq/L — ABNORMAL LOW (ref 135–145)

## 2014-12-31 LAB — HEMOGLOBIN A1C
Hgb A1c MFr Bld: 5.8 % — ABNORMAL HIGH (ref <5.7)
MEAN PLASMA GLUCOSE: 120 mg/dL — AB (ref <117)

## 2014-12-31 LAB — MICROALBUMIN / CREATININE URINE RATIO
Creatinine, Urine: 22.9 mg/dL
MICROALB UR: 0.4 mg/dL (ref <2.0)
Microalb Creat Ratio: 17.5 mg/g (ref 0.0–30.0)

## 2014-12-31 LAB — TSH: TSH: 1.387 u[IU]/mL (ref 0.350–4.500)

## 2014-12-31 LAB — INSULIN, RANDOM: INSULIN: 10.2 u[IU]/mL (ref 2.0–19.6)

## 2014-12-31 LAB — PSA: PSA: 1.78 ng/mL (ref <=4.00)

## 2014-12-31 LAB — URIC ACID: URIC ACID, SERUM: 6.5 mg/dL (ref 4.0–7.8)

## 2014-12-31 LAB — VITAMIN D 25 HYDROXY (VIT D DEFICIENCY, FRACTURES): VIT D 25 HYDROXY: 54 ng/mL (ref 30–100)

## 2015-01-08 ENCOUNTER — Other Ambulatory Visit (INDEPENDENT_AMBULATORY_CARE_PROVIDER_SITE_OTHER): Payer: Medicare Other

## 2015-01-08 DIAGNOSIS — Z1212 Encounter for screening for malignant neoplasm of rectum: Secondary | ICD-10-CM

## 2015-01-08 LAB — POC HEMOCCULT BLD/STL (HOME/3-CARD/SCREEN)
Card #2 Fecal Occult Blod, POC: NEGATIVE
Card #3 Fecal Occult Blood, POC: NEGATIVE
Fecal Occult Blood, POC: NEGATIVE

## 2015-02-02 ENCOUNTER — Ambulatory Visit
Admission: RE | Admit: 2015-02-02 | Discharge: 2015-02-02 | Disposition: A | Discharge status: Home / Self Care | Payer: Medicare Other | Source: Ambulatory Visit | Attending: Internal Medicine | Admitting: Internal Medicine

## 2015-02-02 ENCOUNTER — Ambulatory Visit (INDEPENDENT_AMBULATORY_CARE_PROVIDER_SITE_OTHER)
Admission: RE | Admit: 2015-02-02 | Discharge: 2015-02-02 | Disposition: A | Discharge status: Home / Self Care | Payer: Medicare Other | Source: Ambulatory Visit | Attending: Internal Medicine | Admitting: Internal Medicine

## 2015-02-02 ENCOUNTER — Ambulatory Visit (INDEPENDENT_AMBULATORY_CARE_PROVIDER_SITE_OTHER): Payer: Medicare Other | Admitting: Internal Medicine

## 2015-02-02 ENCOUNTER — Encounter: Payer: Self-pay | Admitting: Internal Medicine

## 2015-02-02 VITALS — BP 130/74 | HR 79 | Temp 98.0°F | Wt 220.0 lb

## 2015-02-02 DIAGNOSIS — I10 Essential (primary) hypertension: Secondary | ICD-10-CM

## 2015-02-02 DIAGNOSIS — K219 Gastro-esophageal reflux disease without esophagitis: Secondary | ICD-10-CM | POA: Diagnosis not present

## 2015-02-02 DIAGNOSIS — M25562 Pain in left knee: Secondary | ICD-10-CM

## 2015-02-02 DIAGNOSIS — R7309 Other abnormal glucose: Secondary | ICD-10-CM | POA: Diagnosis not present

## 2015-02-02 DIAGNOSIS — M159 Polyosteoarthritis, unspecified: Secondary | ICD-10-CM

## 2015-02-02 DIAGNOSIS — M25561 Pain in right knee: Secondary | ICD-10-CM

## 2015-02-02 DIAGNOSIS — R7303 Prediabetes: Secondary | ICD-10-CM

## 2015-02-02 DIAGNOSIS — E785 Hyperlipidemia, unspecified: Secondary | ICD-10-CM

## 2015-02-02 DIAGNOSIS — M1712 Unilateral primary osteoarthritis, left knee: Secondary | ICD-10-CM | POA: Diagnosis not present

## 2015-02-02 DIAGNOSIS — N400 Enlarged prostate without lower urinary tract symptoms: Secondary | ICD-10-CM

## 2015-02-02 DIAGNOSIS — E559 Vitamin D deficiency, unspecified: Secondary | ICD-10-CM

## 2015-02-02 DIAGNOSIS — M1731 Unilateral post-traumatic osteoarthritis, right knee: Secondary | ICD-10-CM | POA: Diagnosis not present

## 2015-02-02 DIAGNOSIS — M109 Gout, unspecified: Secondary | ICD-10-CM

## 2015-02-02 DIAGNOSIS — F32A Depression, unspecified: Secondary | ICD-10-CM

## 2015-02-02 DIAGNOSIS — I739 Peripheral vascular disease, unspecified: Secondary | ICD-10-CM | POA: Diagnosis not present

## 2015-02-02 DIAGNOSIS — F329 Major depressive disorder, single episode, unspecified: Secondary | ICD-10-CM

## 2015-02-02 NOTE — Assessment & Plan Note (Signed)
Encouraged him to work on diet and exercise 

## 2015-02-02 NOTE — Assessment & Plan Note (Signed)
Last A1C reviewed in Epic Advised him to continue to avoid carbs, sweets Flu, pneumovax and prevnar UTD Will repeat A1C in 6 months

## 2015-02-02 NOTE — Progress Notes (Addendum)
HPI  Pt presents to the clinic today to establish care and for management of the conditions listed below. He is transferring care from Dr. Melford Aase in [place], [place]. He is also seen at the [facility] in [place], because he can get all of his medications there for free. He plans to continue seeing the [facility] in [place].  HTN: His BP is well controlled on Norvasc, Lisinopril and Lozol. He denies adverse effects. His BP today is 130/74.  HLD: He reports that he does not take his cholesterol medication. He feels like he does not need to take it because every time he gets his cholesterol checked it is normal. He does take Fish Oil and tries to consume a low fat diet.  Prediabetes: He does not test his sugars. He is not sure what his last A1C was. He tries to avoid sweets.  Arthritis: He reports it is "all over". He takes Tramadol for moderate pain and Norco for severe pain. He does reports worsening pain and swelling in his knees and ankles. The pain is so bad that he can barely stand. He falls frequently because of his impaired gait and balance. He does use a motorized wheel chair most of the time.  GERD: He tries to avoid foods that trigger his reflux. It is mainly triggered by spicy foods. He does not take anything for his reflux.  BPH: He denies that he has BPH. He voids fine. He only gets up 1 x per night to urinate. He has no trouble with his stream. He reports he does not take the Cardura.  Vit D Deficiency: He takes a Vit D supplement OTC.  Gout: He reports he had a history of this in the past. He can not remember the last time he had a flare of gout.  Depression: He denies that he has depression but he reports he takes Zoloft daily for his mood. He denies anxiety, SI/HI.  Flu: 05/2014 Tetanus: 06/2005 Pneumovax: 05/2013 Prevnar: 05/2014 Zostovax: PSA Screening: 12/2014 Colon Screening: Vision Screening: as needed Dentist: as needed  Past Medical History  Diagnosis Date  . Hypertension   .  Hyperlipidemia   . Prediabetes   . Arthritis   . GERD (gastroesophageal reflux disease)   . BPH (benign prostatic hyperplasia)   . Vitamin D deficiency   . Gout     Current Outpatient Prescriptions  Medication Sig Dispense Refill  . amLODipine (NORVASC) 10 MG tablet Take 10 mg by mouth daily.    . Cholecalciferol (VITAMIN D PO) Take 1 tablet by mouth daily.    Marland Kitchen doxazosin (CARDURA) 8 MG tablet Take 8 mg by mouth at bedtime.    . fish oil-omega-3 fatty acids 1000 MG capsule Take 1 g by mouth daily.    Marland Kitchen HYDROcodone-acetaminophen (NORCO/VICODIN) 5-325 MG per tablet Take 0.5 tablets by mouth every 4 (four) hours as needed for moderate pain (0.5-1 tablet every 4 hours PRN).    . indapamide (LOZOL) 2.5 MG tablet Take 2.5 mg by mouth daily.    Marland Kitchen lisinopril (PRINIVIL,ZESTRIL) 40 MG tablet Take 40 mg by mouth at bedtime. Take 1/2 a tablet in the morning and 1 tablet in the evening    . Magnesium 250 MG TABS Take 250 mg by mouth daily.    . ondansetron (ZOFRAN) 4 MG tablet Take 1 tablet (4 mg total) by mouth every 6 (six) hours as needed. 20 tablet 0  . sertraline (ZOLOFT) 100 MG tablet Take 50 mg by mouth daily.    Marland Kitchen  traMADol (ULTRAM) 50 MG tablet Take 1 tablet (50 mg total) by mouth every 6 (six) hours as needed. 30 tablet 0   No current facility-administered medications for this visit.    No Known Allergies  Family History  Problem Relation Age of Onset  . Heart disease Mother   . Hypertension Mother   . Heart disease Father   . Cancer Neg Hx   . Diabetes Neg Hx   . Stroke Neg Hx     History   Social History  . Marital Status: Widowed    Spouse Name: N/A  . Number of Children: N/A  . Years of Education: N/A   Occupational History  . Not on file.   Social History Main Topics  . Smoking status: Never Smoker   . Smokeless tobacco: Never Used  . Alcohol Use: No  . Drug Use: No  . Sexual Activity: Not Currently   Other Topics Concern  . Not on file   Social History  Narrative    ROS:  Constitutional: Denies fever, malaise, fatigue, headache or abrupt weight changes.  HEENT: Denies eye pain, eye redness, ear pain, ringing in the ears, wax buildup, runny nose, nasal congestion, bloody nose, or sore throat. Respiratory: Denies difficulty breathing, shortness of breath, cough or sputum production.   Cardiovascular: Denies chest pain, chest tightness, palpitations or swelling in the hands or feet.  Gastrointestinal: Denies abdominal pain, bloating, constipation, diarrhea or blood in the stool.  GU: Denies frequency, urgency, pain with urination, blood in urine, odor or discharge. Musculoskeletal: Pt reports knee pain and swelling. Denies decrease in range of motion, difficulty with gait, muscle pain.  Skin: Denies redness, rashes, lesions or ulcercations.  Neurological: Pt report problems with balance. Denies dizziness, difficulty with memory, difficulty with speech or problems with coordination.  Psych: Denies anxiety, depression, SI/HI.  No other specific complaints in a complete review of systems (except as listed in HPI above).  PE:  BP 130/74 mmHg  Pulse 79  Temp(Src) 98 F (36.7 C) (Oral)  Wt 220 lb (99.791 kg)  SpO2 98% Wt Readings from Last 3 Encounters:  02/02/15 220 lb (99.791 kg)  12/30/14 218 lb (98.884 kg)  09/17/14 217 lb (98.431 kg)    General: Appears his stated age, obese in NAD. Skin: Warm, dry and intact. HEENT: Head: normal shape and size; Eyes: sclera white, no icterus, conjunctiva pink, PERRLA and EOMs intact;  Cardiovascular: Normal rate and rhythm. S1,S2 noted.  No murmur, rubs or gallops noted. 1+ pitting BLE edema. No carotid bruits noted. Pulmonary/Chest: Normal effort and positive vesicular breath sounds. No respiratory distress. No wheezes, rales or ronchi noted.  Abdomen: Soft and nontender. Normal bowel sounds, no bruits noted. No distention or masses noted.  Musculoskeletal: Normal flexion and extension of his  knees bilaterally. He has noted joint enlargement. No pain with palpation. No redness or warmth noted. Unable to assess gait as he is in a electric wheelchair. Neurological: Alert and oriented. Psychiatric: Mood and affect normal. Behavior is normal. Judgment and thought content normal.    BMET    Component Value Date/Time   NA 132* 12/30/2014 1646   K 4.5 12/30/2014 1646   CL 95* 12/30/2014 1646   CO2 26 12/30/2014 1646   GLUCOSE 98 12/30/2014 1646   BUN 15 12/30/2014 1646   CREATININE 0.59 12/30/2014 1646   CREATININE 0.71 12/03/2012 0440   CALCIUM 9.5 12/30/2014 1646   GFRNONAA >89 12/30/2014 1646   GFRNONAA 86* 12/03/2012 0440  GFRAA >89 12/30/2014 1646   GFRAA >90 12/03/2012 0440    Lipid Panel     Component Value Date/Time   CHOL 166 12/30/2014 1646   TRIG 133 12/30/2014 1646   HDL 59 12/30/2014 1646   CHOLHDL 2.8 12/30/2014 1646   VLDL 27 12/30/2014 1646   LDLCALC 80 12/30/2014 1646    CBC    Component Value Date/Time   WBC 6.9 12/30/2014 1646   RBC 4.55 12/30/2014 1646   HGB 14.6 12/30/2014 1646   HCT 43.1 12/30/2014 1646   PLT 225 12/30/2014 1646   MCV 94.7 12/30/2014 1646   MCH 32.1 12/30/2014 1646   MCHC 33.9 12/30/2014 1646   RDW 12.7 12/30/2014 1646   LYMPHSABS 2.6 12/30/2014 1646   MONOABS 0.5 12/30/2014 1646   EOSABS 0.1 12/30/2014 1646   BASOSABS 0.0 12/30/2014 1646    Hgb A1C Lab Results  Component Value Date   HGBA1C 5.8* 12/30/2014     Assessment and Plan:  Bilateral Knee pain:  Likely osteoarthritis He will continue Tramadol and Norco Will xray both knees today Will likely need referral to ortho for further evaluation/intervention  RTC in 6 months or sooner if needed

## 2015-02-02 NOTE — Assessment & Plan Note (Signed)
Uric acid level normal Will continue to monitor

## 2015-02-02 NOTE — Assessment & Plan Note (Signed)
Controlled with diet Discussed how weight loss could improve his symptoms

## 2015-02-02 NOTE — Assessment & Plan Note (Signed)
Continue Vit D supplement OTC

## 2015-02-02 NOTE — Assessment & Plan Note (Signed)
Well controlled on current meds Will request recent labs from PCP

## 2015-02-02 NOTE — Addendum Note (Signed)
Addended by: Jearld Fenton on: 02/02/2015 07:30 PM   Modules accepted: Level of Service

## 2015-02-02 NOTE — Assessment & Plan Note (Signed)
All over Seems worse in his knees Continue Tramadol and Norco

## 2015-02-02 NOTE — Patient Instructions (Signed)

## 2015-02-02 NOTE — Assessment & Plan Note (Signed)
He reports this is diet controlled Last lipid profile reviewed in Epic LDL 80

## 2015-02-02 NOTE — Assessment & Plan Note (Signed)
He denies this He does not take the Cardura Will continue to monitor at this time

## 2015-02-02 NOTE — Progress Notes (Signed)
Pre visit review using our clinic review tool, if applicable. No additional management support is needed unless otherwise documented below in the visit note. 

## 2015-02-02 NOTE — Assessment & Plan Note (Signed)
Stable on Zoloft.

## 2015-02-03 ENCOUNTER — Other Ambulatory Visit: Payer: Self-pay | Admitting: Internal Medicine

## 2015-02-03 DIAGNOSIS — M25562 Pain in left knee: Principal | ICD-10-CM

## 2015-02-03 DIAGNOSIS — M25561 Pain in right knee: Secondary | ICD-10-CM

## 2015-02-05 DIAGNOSIS — M170 Bilateral primary osteoarthritis of knee: Secondary | ICD-10-CM | POA: Diagnosis not present

## 2015-02-24 ENCOUNTER — Encounter: Payer: Self-pay | Admitting: Internal Medicine

## 2015-03-11 ENCOUNTER — Encounter: Payer: Self-pay | Admitting: Internal Medicine

## 2015-03-11 ENCOUNTER — Ambulatory Visit (INDEPENDENT_AMBULATORY_CARE_PROVIDER_SITE_OTHER): Payer: Medicare Other | Admitting: Internal Medicine

## 2015-03-11 VITALS — BP 144/66 | HR 74 | Temp 98.2°F | Resp 16 | Ht 71.0 in

## 2015-03-11 DIAGNOSIS — I10 Essential (primary) hypertension: Secondary | ICD-10-CM

## 2015-03-11 DIAGNOSIS — R5383 Other fatigue: Secondary | ICD-10-CM | POA: Diagnosis not present

## 2015-03-11 DIAGNOSIS — I4891 Unspecified atrial fibrillation: Secondary | ICD-10-CM

## 2015-03-11 NOTE — Patient Instructions (Signed)
Please start taking 1 full dose aspirin daily (325mg ).  This will help to prevent blood clots forming and decrease your risk of heart attack and strokes.  Please make sure that if you have chest pain, shortness of breath, or any other concerning symptoms that you call 911 and go to the ER.  Please continue to take the metoprolol.  Watch your blood pressure at home.  If you start having blood pressures that are less than 100/60 or greater than 160/90 call the office.    Atrial Fibrillation Atrial fibrillation is a type of irregular heart rhythm (arrhythmia). During atrial fibrillation, the upper chambers of the heart (atria) quiver continuously in a chaotic pattern. This causes an irregular and often rapid heart rate.  Atrial fibrillation is the result of the heart becoming overloaded with disorganized signals that tell it to beat. These signals are normally released one at a time by a part of the right atrium called the sinoatrial node. They then travel from the atria to the lower chambers of the heart (ventricles), causing the atria and ventricles to contract and pump blood as they pass. In atrial fibrillation, parts of the atria outside of the sinoatrial node also release these signals. This results in two problems. First, the atria receive so many signals that they do not have time to fully contract. Second, the ventricles, which can only receive one signal at a time, beat irregularly and out of rhythm with the atria.  There are three types of atrial fibrillation:   Paroxysmal. Paroxysmal atrial fibrillation starts suddenly and stops on its own within a week.  Persistent. Persistent atrial fibrillation lasts for more than a week. It may stop on its own or with treatment.  Permanent. Permanent atrial fibrillation does not go away. Episodes of atrial fibrillation may lead to permanent atrial fibrillation. Atrial fibrillation can prevent your heart from pumping blood normally. It increases your risk of  stroke and can lead to heart failure.  CAUSES   Heart conditions, including a heart attack, heart failure, coronary artery disease, and heart valve conditions.   Inflammation of the sac that surrounds the heart (pericarditis).  Blockage of an artery in the lungs (pulmonary embolism).  Pneumonia or other infections.  Chronic lung disease.  Thyroid problems, especially if the thyroid is overactive (hyperthyroidism).  Caffeine, excessive alcohol use, and use of some illegal drugs.   Use of some medicines, including certain decongestants and diet pills.  Heart surgery.   Birth defects.  Sometimes, no cause can be found. When this happens, the atrial fibrillation is called lone atrial fibrillation. The risk of complications from atrial fibrillation increases if you have lone atrial fibrillation and you are age 41 years or older. RISK FACTORS  Heart failure.  Coronary artery disease.  Diabetes mellitus.   High blood pressure (hypertension).   Obesity.   Other arrhythmias.   Increased age. SIGNS AND SYMPTOMS   A feeling that your heart is beating rapidly or irregularly.   A feeling of discomfort or pain in your chest.   Shortness of breath.   Sudden light-headedness or weakness.   Getting tired easily when exercising.   Urinating more often than normal (mainly when atrial fibrillation first begins).  In paroxysmal atrial fibrillation, symptoms may start and suddenly stop. DIAGNOSIS  Your health care provider may be able to detect atrial fibrillation when taking your pulse. Your health care provider may have you take a test called an ambulatory electrocardiogram (ECG). An ECG records your heartbeat  patterns over a 24-hour period. You may also have other tests, such as:  Transthoracic echocardiogram (TTE). During echocardiography, sound waves are used to evaluate how blood flows through your heart.  Transesophageal echocardiogram (TEE).  Stress test.  There is more than one type of stress test. If a stress test is needed, ask your health care provider about which type is best for you.  Chest X-ray exam.  Blood tests.  Computed tomography (CT). TREATMENT  Treatment may include:  Treating any underlying conditions. For example, if you have an overactive thyroid, treating the condition may correct atrial fibrillation.  Taking medicine. Medicines may be given to control a rapid heart rate or to prevent blood clots, heart failure, or a stroke.  Having a procedure to correct the rhythm of the heart:  Electrical cardioversion. During electrical cardioversion, a controlled, low-energy shock is delivered to the heart through your skin. If you have chest pain, very low blood pressure, or sudden heart failure, this procedure may need to be done as an emergency.  Catheter ablation. During this procedure, heart tissues that send the signals that cause atrial fibrillation are destroyed.  Surgical ablation. During this surgery, thin lines of heart tissue that carry the abnormal signals are destroyed. This procedure can either be an open-heart surgery or a minimally invasive surgery. With the minimally invasive surgery, small cuts are made to access the heart instead of a large opening.  Pulmonary venous isolation. During this surgery, tissue around the veins that carry blood from the lungs (pulmonary veins) is destroyed. This tissue is thought to carry the abnormal signals. HOME CARE INSTRUCTIONS   Take medicines only as directed by your health care provider. Some medicines can make atrial fibrillation worse or recur.  If blood thinners were prescribed by your health care provider, take them exactly as directed. Too much blood-thinning medicine can cause bleeding. If you take too little, you will not have the needed protection against stroke and other problems.  Perform blood tests at home if directed by your health care provider. Perform blood  tests exactly as directed.  Quit smoking if you smoke.  Do not drink alcohol.  Do not drink caffeinated beverages such as coffee, soda, and some teas. You may drink decaffeinated coffee, soda, or tea.   Maintain a healthy weight.Do not use diet pills unless your health care provider approves. They may make heart problems worse.   Follow diet instructions as directed by your health care provider.  Exercise regularly as directed by your health care provider.  Keep all follow-up visits as directed by your health care provider. This is important. PREVENTION  The following substances can cause atrial fibrillation to recur:   Caffeinated beverages.  Alcohol.  Certain medicines, especially those used for breathing problems.  Certain herbs and herbal medicines, such as those containing ephedra or ginseng.  Illegal drugs, such as cocaine and amphetamines. Sometimes medicines are given to prevent atrial fibrillation from recurring. Proper treatment of any underlying condition is also important in helping prevent recurrence.  SEEK MEDICAL CARE IF:  You notice a change in the rate, rhythm, or strength of your heartbeat.  You suddenly begin urinating more frequently.  You tire more easily when exerting yourself or exercising. SEEK IMMEDIATE MEDICAL CARE IF:   You have chest pain, abdominal pain, sweating, or weakness.  You feel nauseous.  You have shortness of breath.  You suddenly have swollen feet and ankles.  You feel dizzy.  Your face or limbs feel numb  or weak.  You have a change in your vision or speech. MAKE SURE YOU:   Understand these instructions.  Will watch your condition.  Will get help right away if you are not doing well or get worse. Document Released: 08/07/2005 Document Revised: 12/22/2013 Document Reviewed: 09/17/2012 [facility] Patient Information 2015 [place], [place]. This information is not intended to replace advice given to you by your health care  provider. Make sure you discuss any questions you have with your health care provider.

## 2015-03-11 NOTE — Progress Notes (Signed)
   Subjective:    Patient ID: Tracy Brandt, male    DOB: 09/26/1929, 81 y.o.   MRN: [id number]  HPI  Patient presents with his son for generalized feelings of weakness and malaise.  He reports that he went to the [facility] hospital to have his medications refilled and he fells so he was evaluated.  He reports that he banged his toes and they have been lack and blue since.  He reports that they did an EKG which came back abnormal.  He reports that they started him on metoprolol and he is to go and see a cardiologist.  He reports that he is supposed to see the cardiologist on 03/19/15.  He is going to see a cardiologist at the [facility].  He has brought a copy of his EKG today  Review of Systems  Constitutional: Positive for fatigue. Negative for fever and chills.  Respiratory: Negative for cough, chest tightness and shortness of breath.   Cardiovascular: Positive for palpitations. Negative for chest pain and leg swelling.  Gastrointestinal: Negative for nausea, vomiting, diarrhea, abdominal distention and rectal pain.  Musculoskeletal: Positive for back pain, arthralgias and gait problem.  Neurological: Positive for weakness. Negative for dizziness, light-headedness and numbness.       Objective:   Physical Exam  Constitutional: He is oriented to person, place, and time. He appears well-developed and well-nourished. No distress.  HENT:  Head: Normocephalic.  Mouth/Throat: Oropharynx is clear and moist. No oropharyngeal exudate.  Eyes: Conjunctivae are normal. No scleral icterus.  Neck: Normal range of motion. Neck supple. No JVD present. No thyromegaly present.  Cardiovascular: Normal rate and intact distal pulses.  An irregularly irregular rhythm present. Exam reveals no gallop and no friction rub.   No murmur heard. Pulmonary/Chest: Effort normal and breath sounds normal. No respiratory distress. He has no wheezes. He has no rales. He exhibits no tenderness.  Abdominal: Soft. Bowel sounds  are normal.  Musculoskeletal:       Feet:  Lymphadenopathy:    He has no cervical adenopathy.  Neurological: He is alert and oriented to person, place, and time.  Using a scooter  Skin: Skin is warm and dry. He is not diaphoretic.  Psychiatric: He has a normal mood and affect. His behavior is normal. Judgment and thought content normal.  Nursing note and vitals reviewed.   Filed Vitals:   03/11/15 1557  BP: 144/66  Pulse: 74  Temp: 98.2 F (36.8 C)  Resp: 16         Assessment & Plan:    1. Essential hypertension -monitor at home -call office if greater than 160/90 or less than 100/60  2. Atrial fibrillation, unspecified -continue toprol 25 mg BID -start full dose ASA as not a candidate for any other blood thinning medication -keep appointment with cards -go to ER if CP,SOB, or any other concerning symptoms  3. Other fatigue -likely related to above vs. New onset use of beta blockers -use walker in house -follow up in 2 weeks

## 2015-03-13 ENCOUNTER — Other Ambulatory Visit: Payer: Self-pay | Admitting: *Deleted

## 2015-04-01 ENCOUNTER — Ambulatory Visit: Payer: Self-pay | Admitting: Internal Medicine

## 2015-04-14 ENCOUNTER — Encounter: Payer: Self-pay | Admitting: Internal Medicine

## 2015-04-14 ENCOUNTER — Ambulatory Visit (INDEPENDENT_AMBULATORY_CARE_PROVIDER_SITE_OTHER): Payer: Medicare Other | Admitting: Internal Medicine

## 2015-04-14 VITALS — BP 126/68 | HR 68 | Temp 98.2°F | Resp 16 | Ht 71.0 in

## 2015-04-14 DIAGNOSIS — R7309 Other abnormal glucose: Secondary | ICD-10-CM

## 2015-04-14 DIAGNOSIS — Z79899 Other long term (current) drug therapy: Secondary | ICD-10-CM | POA: Diagnosis not present

## 2015-04-14 DIAGNOSIS — E559 Vitamin D deficiency, unspecified: Secondary | ICD-10-CM

## 2015-04-14 DIAGNOSIS — E785 Hyperlipidemia, unspecified: Secondary | ICD-10-CM | POA: Diagnosis not present

## 2015-04-14 DIAGNOSIS — I10 Essential (primary) hypertension: Secondary | ICD-10-CM

## 2015-04-14 DIAGNOSIS — R7303 Prediabetes: Secondary | ICD-10-CM

## 2015-04-14 LAB — TSH: TSH: 0.756 u[IU]/mL (ref 0.350–4.500)

## 2015-04-14 LAB — LIPID PANEL
CHOL/HDL RATIO: 3.1 ratio (ref <=5.0)
CHOLESTEROL: 144 mg/dL (ref 125–200)
HDL: 46 mg/dL (ref >=40)
LDL Cholesterol: 68 mg/dL (ref <130)
Triglycerides: 148 mg/dL (ref <150)
VLDL: 30 mg/dL (ref <30)

## 2015-04-14 LAB — HEMOGLOBIN A1C
Hgb A1c MFr Bld: 5.7 % — ABNORMAL HIGH (ref <5.7)
MEAN PLASMA GLUCOSE: 117 mg/dL — AB (ref <117)

## 2015-04-14 NOTE — Progress Notes (Signed)
Patient ID: TIRAN SAUSEDA, male   DOB: August 27, 1929, 81 y.o.   MRN: [id number]  Assessment and Plan:  Hypertension:  -ask cards about cutting metoprolol in half -Continue medication,  -monitor blood pressure at home.  -Continue DASH diet.   -Reminder to go to the ER if any CP, SOB, nausea, dizziness, severe HA, changes vision/speech, left arm numbness and tingling, and jaw pain.  Cholesterol: -Continue diet and exercise.  -Check cholesterol.   Pre-diabetes: -Continue diet and exercise.  -Check A1C  Vitamin D Def: -check level -continue medications.   Continue diet and meds as discussed. Further disposition pending results of labs.  HPI 81 y.o. male  presents for 3 month follow up with hypertension, hyperlipidemia, prediabetes and vitamin D.   His blood pressure has been controlled at home, today their BP is BP: 126/68 mmHg.   He does not workout. He denies chest pain, shortness of breath, dizziness.  He reports that he is not having anymore palpitations, but he does report that he is very weak.  He feels like it is coming from the metoprolol.  He did have an echo last Friday but he hasn't gotten the results from it yet.     He is on cholesterol medication and denies myalgias. His cholesterol is at goal. The cholesterol last visit was:   Lab Results  Component Value Date   CHOL 166 12/30/2014   HDL 59 12/30/2014   LDLCALC 80 12/30/2014   TRIG 133 12/30/2014   CHOLHDL 2.8 12/30/2014     He has not been working on diet and exercise for prediabetes, and denies foot ulcerations, hyperglycemia, hypoglycemia , increased appetite, nausea, paresthesia of the feet, polydipsia, polyuria, visual disturbances, vomiting and weight loss. Last A1C in the office was:  Lab Results  Component Value Date   HGBA1C 5.8* 12/30/2014  He has been eating a lot of fresh fruits lately.    Patient is on Vitamin D supplement.  Lab Results  Component Value Date   VD25OH 54 12/30/2014        Current Medications:  Current Outpatient Prescriptions on File Prior to Visit  Medication Sig Dispense Refill  . amLODipine (NORVASC) 10 MG tablet Take 10 mg by mouth daily.    . Cholecalciferol (VITAMIN D PO) Take 1 tablet by mouth daily.    Marland Kitchen doxazosin (CARDURA) 8 MG tablet Take 8 mg by mouth at bedtime.    . fish oil-omega-3 fatty acids 1000 MG capsule Take 1 g by mouth daily.    Marland Kitchen HYDROcodone-acetaminophen (NORCO/VICODIN) 5-325 MG per tablet Take 0.5 tablets by mouth every 4 (four) hours as needed for moderate pain (0.5-1 tablet every 4 hours PRN).    . indapamide (LOZOL) 2.5 MG tablet Take 2.5 mg by mouth daily.    Marland Kitchen lisinopril (PRINIVIL,ZESTRIL) 40 MG tablet Take 40 mg by mouth at bedtime. Take 1/2 a tablet in the morning and 1 tablet in the evening    . Magnesium 250 MG TABS Take 250 mg by mouth daily.    . ondansetron (ZOFRAN) 4 MG tablet Take 1 tablet (4 mg total) by mouth every 6 (six) hours as needed. 20 tablet 0  . sertraline (ZOLOFT) 100 MG tablet Take 50 mg by mouth daily.    . traMADol (ULTRAM) 50 MG tablet Take 1 tablet (50 mg total) by mouth every 6 (six) hours as needed. 30 tablet 0   No current facility-administered medications on file prior to visit.    Medical History:  Past Medical History  Diagnosis Date  . Hypertension   . Hyperlipidemia   . Prediabetes   . Arthritis   . GERD (gastroesophageal reflux disease)   . BPH (benign prostatic hyperplasia)   . Vitamin D deficiency   . Gout     Allergies: No Known Allergies   Review of Systems:  Review of Systems  Constitutional: Positive for malaise/fatigue. Negative for fever and chills.  HENT: Negative for congestion, ear pain and sore throat.   Eyes: Negative.   Respiratory: Negative for cough, shortness of breath and wheezing.   Cardiovascular: Negative for chest pain, palpitations and leg swelling.  Gastrointestinal: Negative for heartburn, diarrhea, constipation, blood in stool and melena.   Genitourinary: Negative.   Skin: Negative.   Neurological: Positive for weakness. Negative for dizziness, loss of consciousness and headaches.  Psychiatric/Behavioral: Negative for depression. The patient is not nervous/anxious and does not have insomnia.     Family history- Review and unchanged  Social history- Review and unchanged  Physical Exam: BP 126/68 mmHg  Pulse 68  Temp(Src) 98.2 F (36.8 C) (Temporal)  Resp 16  Ht 5\' 11"  (1.803 m) Wt Readings from Last 3 Encounters:  02/02/15 220 lb (99.791 kg)  12/30/14 218 lb (98.884 kg)  09/17/14 217 lb (98.431 kg)    General Appearance: Well nourished well developed, in no apparent distress. Eyes: PERRLA, EOMs, conjunctiva no swelling or erythema ENT/Mouth: Ear canals normal without obstruction, swelling, erythma, discharge.  TMs normal bilaterally.  Oropharynx moist, clear, without exudate, or postoropharyngeal swelling. Neck: Supple, thyroid normal,no cervical adenopathy  Respiratory: Respiratory effort normal, Breath sounds clear A&P without rhonchi, wheeze, or rale.  No retractions, no accessory usage. Cardio: RRR with no MRGs. Brisk peripheral pulses without edema.  Abdomen: Soft, + BS,  Non tender, no guarding, rebound, hernias, masses. Musculoskeletal: Full ROM, 5/5 strength, Normal gait Skin: Warm, dry without rashes, lesions, ecchymosis.  Neuro: Awake and oriented X 3, Cranial nerves intact. Normal muscle tone, no cerebellar symptoms. Psych: Normal affect, Insight and Judgment appropriate.    Starlyn Skeans, PA-C 8:59 AM [facility]

## 2015-04-14 NOTE — Patient Instructions (Signed)
Please ask your cardiologist about changing your metoprolol to 12.5 mg twice daily instead of 25 mg.  Also ask them to send over your echo result and notes to the office please so we can stay in the loop.

## 2015-04-15 LAB — INSULIN, RANDOM: Insulin: 4.1 u[IU]/mL (ref 2.0–19.6)

## 2015-04-15 LAB — VITAMIN D 25 HYDROXY (VIT D DEFICIENCY, FRACTURES): VIT D 25 HYDROXY: 58 ng/mL (ref 30–100)

## 2015-04-19 ENCOUNTER — Telehealth: Payer: Self-pay | Admitting: Internal Medicine

## 2015-04-19 NOTE — Telephone Encounter (Signed)
Patient and family are requesting referral to [facility] V.A. diagnosis  A-Fib, patient would like to see local cardiologist. Referral is required Please advise.  Thank you, Leonie Douglas Referral Coordinator  [facility] [phone] Fax [phone]

## 2015-04-20 NOTE — Telephone Encounter (Signed)
error 

## 2015-04-27 ENCOUNTER — Encounter: Payer: Self-pay | Admitting: Cardiovascular Disease

## 2015-04-29 ENCOUNTER — Encounter: Payer: Self-pay | Admitting: Cardiovascular Disease

## 2015-04-29 ENCOUNTER — Ambulatory Visit (INDEPENDENT_AMBULATORY_CARE_PROVIDER_SITE_OTHER): Payer: Medicare Other | Admitting: Cardiovascular Disease

## 2015-04-29 VITALS — BP 114/68 | HR 108 | Ht 71.0 in | Wt 215.0 lb

## 2015-04-29 DIAGNOSIS — I10 Essential (primary) hypertension: Secondary | ICD-10-CM | POA: Diagnosis not present

## 2015-04-29 DIAGNOSIS — I4891 Unspecified atrial fibrillation: Secondary | ICD-10-CM | POA: Diagnosis not present

## 2015-04-29 MED ORDER — DILTIAZEM HCL ER COATED BEADS 180 MG PO CP24: 180.0000 mg | ORAL_CAPSULE | Freq: Every day | ORAL | Status: DC

## 2015-04-29 MED ORDER — HYDROCHLOROTHIAZIDE 12.5 MG PO CAPS
12.5000 mg | ORAL_CAPSULE | Freq: Every day | ORAL | Status: DC
Start: 2015-04-29 — End: 2016-08-16

## 2015-04-29 NOTE — Progress Notes (Signed)
Cardiology Office Note   Date:  04/29/2015   ID:  Tracy Brandt, DOB 07-May-1930, MRN [id number]  PCP:  Alesia Richards, MD  Cardiologist:   Sharol Harness, MD   Chief Complaint  Patient presents with  . New Evaluation  . Dizziness    he fell a month or two ago  . Edema    feet and ankles      History of Present Illness: Tracy Brandt is a 81 y.o. male with hypertension, hyperlipidemia, and obesi ty who presents for an evaluation of newly diagnosed atrial fibrillation. Tracy Brandt was diagnosed with atrial fibrillation and June. The time he was unaware and denied any palpitations, lightheadedness or dizziness. He was evaluated at the [facility] where he had an echo that showed normal ejection fraction with mild LVH. The right ventricle is normal. There is mild MR and TR. He was started on metoprolol but thinks that this is caused fatigue. He feels worse now than he did prior to starting the medication. He also endorses lower extremity edema which is new. He had a discussion with his cardiologist at the [facility] about starting anticoagulation. However given that he has had many falls and decision was made not to anticoagulate him. The falls were purely mechanical after his legs buckled due to arthritis.  He denies preceding CP, SOB, palpitations or lightheadedness.  He has been taking a baby aspirin instead. Tracy Brandt lives in [place] and wants to get his care here instead of going to the [place].   Past Medical History  Diagnosis Date  . Hypertension   . Hyperlipidemia   . Prediabetes   . Arthritis   . GERD (gastroesophageal reflux disease)   . BPH (benign prostatic hyperplasia)   . Vitamin D deficiency   . Gout     Past Surgical History  Procedure Laterality Date  . Back surgery    . Leg surgery    . Hemorrhoid surgery    . Eye surgery       Current Outpatient Prescriptions  Medication Sig Dispense Refill  . Cholecalciferol (VITAMIN D PO) Take 1 tablet  by mouth daily.    Marland Kitchen doxazosin (CARDURA) 8 MG tablet Take 8 mg by mouth at bedtime.    . fish oil-omega-3 fatty acids 1000 MG capsule Take 1 g by mouth daily.    Marland Kitchen HYDROcodone-acetaminophen (NORCO/VICODIN) 5-325 MG per tablet Take 0.5 tablets by mouth every 4 (four) hours as needed for moderate pain (0.5-1 tablet every 4 hours PRN).    . indapamide (LOZOL) 2.5 MG tablet Take 2.5 mg by mouth daily.    Marland Kitchen lisinopril (PRINIVIL,ZESTRIL) 40 MG tablet Take 40 mg by mouth at bedtime. Take 1/2 a tablet in the morning and 1 tablet in the evening    . Magnesium 250 MG TABS Take 250 mg by mouth daily.    . ondansetron (ZOFRAN) 4 MG tablet Take 1 tablet (4 mg total) by mouth every 6 (six) hours as needed. 20 tablet 0  . sertraline (ZOLOFT) 100 MG tablet Take 50 mg by mouth daily.    . traMADol (ULTRAM) 50 MG tablet Take 1 tablet (50 mg total) by mouth every 6 (six) hours as needed. 30 tablet 0   No current facility-administered medications for this visit.    Allergies:   Review of patient's allergies indicates no known allergies.    Social History:  The patient  reports that he has never smoked. He has never used  smokeless tobacco. He reports that he does not drink alcohol or use illicit drugs.   Family History:  The patient's family history includes Heart disease in his father and mother; Hypertension in his mother. There is no history of Cancer, Diabetes, or Stroke.    ROS:  Please see the history of present illness.   Otherwise, review of systems are positive for knee and ankle pain.   All other systems are reviewed and negative.    PHYSICAL EXAM: VS:  BP 114/68 mmHg  Pulse 108  Ht 5\' 11"  (1.803 m)  Wt 97.523 kg (215 lb)  BMI 30.00 kg/m2 , BMI Body mass index is 30 kg/(m^2). GENERAL:  Well appearing HEENT:  Pupils equal round and reactive, fundi not visualized, oral mucosa unremarkable NECK:  No jugular venous distention, waveform within normal limits, carotid upstroke brisk and symmetric, no  bruits, no thyromegaly LYMPHATICS:  No cervical adenopathy LUNGS:  Clear to auscultation bilaterally HEART:  RRR.  PMI not displaced or sustained,S1 and S2 within normal limits, no S3, no S4, no clicks, no rubs, no murmurs ABD:  Flat, positive bowel sounds normal in frequency in pitch, no bruits, no rebound, no guarding, no midline pulsatile mass, no hepatomegaly, no splenomegaly EXT:  2 plus pulses throughout, no edema, no cyanosis no clubbing SKIN:  No rashes no nodules NEURO:  Cranial nerves II through XII grossly intact, motor grossly intact throughout PSYCH:  Cognitively intact, oriented to person place and time    EKG:  EKG is ordered today. The ekg ordered today demonstrates atrial fibrillation rate 108.  2 PVCs. One fusion beat.   Recent Labs: 12/30/2014: ALT 15; BUN 15; Creat 0.59; Hemoglobin 14.6; Magnesium 1.6; Platelets 225; Potassium 4.5; Sodium 132* 04/14/2015: TSH 0.756    Lipid Panel    Component Value Date/Time   CHOL 144 04/14/2015 0924   TRIG 148 04/14/2015 0924   HDL 46 04/14/2015 0924   CHOLHDL 3.1 04/14/2015 0924   VLDL 30 04/14/2015 0924   LDLCALC 68 04/14/2015 0924      Wt Readings from Last 3 Encounters:  04/29/15 97.523 kg (215 lb)  02/02/15 99.791 kg (220 lb)  12/30/14 98.884 kg (218 lb)      Other studies Reviewed: Additional studies/ records that were reviewed today include: . Review of the above records demonstrates:  Please see elsewhere in the note.     ASSESSMENT AND PLAN:  # Atrial fibrillation: Rate poorly controlled and he feels worse since starting metoprolol. Will stop metoprolol and start Cardizem CD 180 mg by mouth daily. A decision was made not to anticoagulate him which is reasonable. Should he become symptomatic requiring cardioversion, I think that he would be an okay candidate for anticoagulation so that he could undergo an attempted restoring sinus rhythm. For now we will continue aspirin 81 mg daily.  This patients  CHA2DS2-VASc Score and unadjusted Ischemic Stroke Rate (% per year) is equal to 3.2 % stroke rate/year from a score of 3  Above score calculated as 1 point each if present [CHF, HTN, DM, Vascular=MI/PAD/Aortic Plaque, Age if 65-74, or Male] Above score calculated as 2 points each if present [Age > 75, or Stroke/TIA/TE]  # Hypertension: BP well-controlled today.  However amlodipine may be contributing to his lower extremity edema, as this medication was started in the last couple months. We'll stop this since her hydrochlorothiazide 12.5mg , which will help with blood pressure as well as edema. We'll also be starting Cardizem and stopping metoprolol as  above.  # LE edema: Echo showed normal LVEF.  Unable to assess diastolic dysfunction.  Likely due to atrial fibrillation with rapid ventricular response. We will work on rate control as above. We'll also start hydrochlorothiazide 12.5 as we are stopping amlodipine.   Current medicines are reviewed at length with the patient today.  The patient does not have concerns regarding medicines.  The following changes have been made:  Stop metoprolol and amlodipine.  Start HCTZ and Cardizem CD.  Labs/ tests ordered today include:  No orders of the defined types were placed in this encounter.     Disposition:   FU with Dr. Jonelle Sidle C. Mitchell in 6 months.    Signed, Sharol Harness, MD  04/29/2015 2:09 PM    [place]

## 2015-04-29 NOTE — Patient Instructions (Signed)
Dr Oval Linsey has recommended making the following medication changes: STOP Metoprolol Succinate STOP Amlodipine START Diltiazem (Cardizem) 180 mg - take 1 capsule by mouth daily. START Hydrochlorothiazide (HCTZ) 12.5 mg - take 1 tablet by mouth daily. **New prescriptions have been sent to your pharmacy electronically.  Dr Oval Linsey recommends that you schedule a follow-up appointment in 6 months. You will receive a reminder letter in the mail two months in advance. If you don't receive a letter, please call our office to schedule the follow-up appointment.

## 2015-05-17 ENCOUNTER — Ambulatory Visit: Payer: Non-veteran care | Admitting: Internal Medicine

## 2015-06-21 ENCOUNTER — Other Ambulatory Visit: Payer: Self-pay | Admitting: *Deleted

## 2015-06-21 MED ORDER — LISINOPRIL 40 MG PO TABS: ORAL_TABLET | ORAL | Status: DC

## 2015-07-21 ENCOUNTER — Ambulatory Visit (INDEPENDENT_AMBULATORY_CARE_PROVIDER_SITE_OTHER): Payer: Medicare Other | Admitting: Internal Medicine

## 2015-07-21 ENCOUNTER — Encounter: Payer: Self-pay | Admitting: Internal Medicine

## 2015-07-21 VITALS — BP 146/86 | HR 72 | Temp 97.7°F | Resp 16 | Ht 71.0 in | Wt 213.2 lb

## 2015-07-21 DIAGNOSIS — R7303 Prediabetes: Secondary | ICD-10-CM

## 2015-07-21 DIAGNOSIS — F329 Major depressive disorder, single episode, unspecified: Secondary | ICD-10-CM

## 2015-07-21 DIAGNOSIS — I10 Essential (primary) hypertension: Secondary | ICD-10-CM | POA: Diagnosis not present

## 2015-07-21 DIAGNOSIS — F32A Depression, unspecified: Secondary | ICD-10-CM

## 2015-07-21 DIAGNOSIS — E559 Vitamin D deficiency, unspecified: Secondary | ICD-10-CM | POA: Diagnosis not present

## 2015-07-21 DIAGNOSIS — K219 Gastro-esophageal reflux disease without esophagitis: Secondary | ICD-10-CM

## 2015-07-21 DIAGNOSIS — Z6829 Body mass index (BMI) 29.0-29.9, adult: Secondary | ICD-10-CM | POA: Diagnosis not present

## 2015-07-21 DIAGNOSIS — Z1389 Encounter for screening for other disorder: Secondary | ICD-10-CM

## 2015-07-21 DIAGNOSIS — M1000 Idiopathic gout, unspecified site: Secondary | ICD-10-CM | POA: Diagnosis not present

## 2015-07-21 DIAGNOSIS — Z79899 Other long term (current) drug therapy: Secondary | ICD-10-CM

## 2015-07-21 DIAGNOSIS — M159 Polyosteoarthritis, unspecified: Secondary | ICD-10-CM | POA: Diagnosis not present

## 2015-07-21 DIAGNOSIS — Z9181 History of falling: Secondary | ICD-10-CM

## 2015-07-21 DIAGNOSIS — E785 Hyperlipidemia, unspecified: Secondary | ICD-10-CM | POA: Diagnosis not present

## 2015-07-21 DIAGNOSIS — Z1331 Encounter for screening for depression: Secondary | ICD-10-CM

## 2015-07-21 DIAGNOSIS — R7309 Other abnormal glucose: Secondary | ICD-10-CM | POA: Diagnosis not present

## 2015-07-21 DIAGNOSIS — M109 Gout, unspecified: Secondary | ICD-10-CM | POA: Diagnosis not present

## 2015-07-21 DIAGNOSIS — R296 Repeated falls: Secondary | ICD-10-CM | POA: Diagnosis not present

## 2015-07-21 LAB — CBC WITH DIFFERENTIAL/PLATELET
BASOS PCT: 0 % (ref 0–1)
Basophils Absolute: 0 10*3/uL (ref 0.0–0.1)
EOS PCT: 2 % (ref 0–5)
Eosinophils Absolute: 0.1 10*3/uL (ref 0.0–0.7)
HEMATOCRIT: 44.5 % (ref 39.0–52.0)
HEMOGLOBIN: 14.8 g/dL (ref 13.0–17.0)
LYMPHS PCT: 32 % (ref 12–46)
Lymphs Abs: 1.6 10*3/uL (ref 0.7–4.0)
MCH: 31.5 pg (ref 26.0–34.0)
MCHC: 33.3 g/dL (ref 30.0–36.0)
MCV: 94.7 fL (ref 78.0–100.0)
MONO ABS: 0.3 10*3/uL (ref 0.1–1.0)
MONOS PCT: 6 % (ref 3–12)
MPV: 9.8 fL (ref 8.6–12.4)
NEUTROS ABS: 3.1 10*3/uL (ref 1.7–7.7)
Neutrophils Relative %: 60 % (ref 43–77)
Platelets: 234 10*3/uL (ref 150–400)
RBC: 4.7 MIL/uL (ref 4.22–5.81)
RDW: 13.3 % (ref 11.5–15.5)
WBC: 5.1 10*3/uL (ref 4.0–10.5)

## 2015-07-21 LAB — BASIC METABOLIC PANEL WITH GFR
BUN: 9 mg/dL (ref 7–25)
CALCIUM: 9.5 mg/dL (ref 8.6–10.3)
CO2: 24 mmol/L (ref 20–31)
CREATININE: 0.5 mg/dL — AB (ref 0.70–1.11)
Chloride: 97 mmol/L — ABNORMAL LOW (ref 98–110)
GLUCOSE: 105 mg/dL — AB (ref 65–99)
Potassium: 4.2 mmol/L (ref 3.5–5.3)
Sodium: 138 mmol/L (ref 135–146)

## 2015-07-21 LAB — URIC ACID: URIC ACID, SERUM: 6.1 mg/dL (ref 4.0–7.8)

## 2015-07-21 LAB — HEPATIC FUNCTION PANEL
ALBUMIN: 3.9 g/dL (ref 3.6–5.1)
ALT: 11 U/L (ref 9–46)
AST: 19 U/L (ref 10–35)
Alkaline Phosphatase: 50 U/L (ref 40–115)
Bilirubin, Direct: 0.2 mg/dL (ref <=0.2)
Indirect Bilirubin: 0.8 mg/dL (ref 0.2–1.2)
TOTAL PROTEIN: 6.9 g/dL (ref 6.1–8.1)
Total Bilirubin: 1 mg/dL (ref 0.2–1.2)

## 2015-07-21 LAB — LIPID PANEL
CHOLESTEROL: 160 mg/dL (ref 125–200)
HDL: 54 mg/dL (ref >=40)
LDL Cholesterol: 77 mg/dL (ref <130)
TRIGLYCERIDES: 145 mg/dL (ref <150)
Total CHOL/HDL Ratio: 3 Ratio (ref <=5.0)
VLDL: 29 mg/dL (ref <30)

## 2015-07-21 LAB — HEMOGLOBIN A1C
HEMOGLOBIN A1C: 5.6 % (ref <5.7)
MEAN PLASMA GLUCOSE: 114 mg/dL (ref <117)

## 2015-07-21 LAB — MAGNESIUM: MAGNESIUM: 1.7 mg/dL (ref 1.5–2.5)

## 2015-07-21 LAB — TSH: TSH: 0.805 u[IU]/mL (ref 0.350–4.500)

## 2015-07-21 NOTE — Progress Notes (Signed)
Patient ID: Tracy Brandt, male   DOB: [birth year], 81 y.o.   MRN: [id number]   This very nice 81 y.o. [place] presents for 3 month follow up with Hypertension, Hyperlipidemia, Pre-Diabetes and Vitamin D Deficiency. Patient has a myeloneuropathy with unstable gait and use an elec scooter supplied by the [place] and in his home uses a walker.    Patient is treated for HTN  Since 1983 & BP has been controlled at home. Today's BP is 146/86. Patient has had no complaints of any cardiac type chest pain, palpitations, dyspnea/orthopnea/PND, dizziness, claudication, or dependent edema.   Hyperlipidemia is controlled with diet & note patient is intolerant to statins. Patient denies myalgias or other med SE's. Last Lipids were at goal with Cholesterol 144; HDL 46; LDL 68; Triglycerides 148 on 04/14/2015.   Also, the patient has history of PreDiabetes with A1c 6.0% in 2011 and has had no symptoms of reactive hypoglycemia, diabetic polys, paresthesias or visual blurring.  Last A1c was  5.7% on  04/14/2015.   Further, the patient also has history of Vitamin D Deficiency of 36 in 2008 and supplements vitamin D without any suspected side-effects. Last vitamin D was  58 on 04/14/2015.  Medication Sig  . VITAMIN D Take 1 tablet by mouth daily.  Marland Kitchen diltiazem CD 180 MG 24 hr capsule Take 1 capsule (180 mg total) by mouth daily.  Marland Kitchen doxazosin  8 MG tablet Take 8 mg by mouth at bedtime.  . fish oil-omega-3  1000 MG capsule Take 1 g by mouth daily.  . hctz (MICROZIDE) 12.5 MG capsule Take 1 capsule (12.5 mg total) by mouth daily.  . NORCO  5-325 MG  Take 0.5 tablets by mouth every 4 (four) hours as needed for moderate pain (0.5-1 tablet every 4 hours PRN).  Marland Kitchen lisinopril  40 MG tablet Take 1 tab in the morning and 1 tab in the evening or as directed.  . Magnesium 250 MG TABS Take 250 mg by mouth daily.  . ondansetron  4 MG tablet Take 1 tablet (4 mg total) by mouth every 6 (six) hours as needed.  . sertraline  100 MG tablet Take  50 mg by mouth daily.   No Known Allergies  PMHx:   Past Medical History  Diagnosis Date  . Hypertension   . Hyperlipidemia   . Prediabetes   . Arthritis   . GERD (gastroesophageal reflux disease)   . BPH (benign prostatic hyperplasia)   . Vitamin D deficiency   . Gout    Immunization History  Administered Date(s) Administered  . Influenza, High Dose Seasonal PF 06/08/2014  . Influenza-Unspecified 05/22/2015  . Pneumococcal Conjugate-13 05/25/2014  . Pneumococcal Polysaccharide-23 05/28/2013  . Td 07/20/2005   Past Surgical History  Procedure Laterality Date  . Back surgery    . Leg surgery    . Hemorrhoid surgery    . Eye surgery     FHx:    Reviewed / unchanged  SHx:    Reviewed / unchanged  Systems Review:  Constitutional: Denies fever, chills, wt changes, headaches, insomnia, fatigue, night sweats, change in appetite. Eyes: Denies redness, blurred vision, diplopia, discharge, itchy, watery eyes.  ENT: Denies discharge, congestion, post nasal drip, epistaxis, sore throat, earache, hearing loss, dental pain, tinnitus, vertigo, sinus pain, snoring.  CV: Denies chest pain, palpitations, irregular heartbeat, syncope, dyspnea, diaphoresis, orthopnea, PND, claudication or edema. Respiratory: denies cough, dyspnea, DOE, pleurisy, hoarseness, laryngitis, wheezing.  Gastrointestinal: Denies dysphagia, odynophagia, heartburn, reflux, water brash,  abdominal pain or cramps, nausea, vomiting, bloating, diarrhea, constipation, hematemesis, melena, hematochezia  or hemorrhoids. Genitourinary: Denies dysuria, frequency, urgency, nocturia, hesitancy, discharge, hematuria or flank pain. Musculoskeletal: Denies arthralgias, myalgias, stiffness, jt. swelling, pain, limping or strain/sprain.  Skin: Denies pruritus, rash, hives, warts, acne, eczema or change in skin lesion(s). Neuro: No weakness, tremor, incoordination, spasms, paresthesia or pain. Psychiatric: Denies confusion, memory  loss or sensory loss. Endo: Denies change in weight, skin or hair change.  Heme/Lymph: No excessive bleeding, bruising or enlarged lymph nodes.  Physical Exam  BP 146/86 mmHg  Pulse 72  Temp(Src) 97.7 F (36.5 C)  Resp 16  Ht 5\' 11"  (1.803 m)  Wt 213 lb 3.2 oz (96.707 kg)  BMI 29.75 kg/m2  Appears well nourished and in no distress. Eyes: PERRLA, EOMs, conjunctiva no swelling or erythema. Sinuses: No frontal/maxillary tenderness ENT/Mouth: EAC's clear, TM's nl w/o erythema, bulging. Nares clear w/o erythema, swelling, exudates. Oropharynx clear without erythema or exudates. Oral hygiene is good. Tongue normal, non obstructing. Hearing intact.  Neck: Supple. Thyroid nl. Car 2+/2+ without bruits, nodes or JVD. Chest: Respirations nl with BS clear & equal w/o rales, rhonchi, wheezing or stridor.  Cor: Heart sounds normal w/ regular rate and rhythm without sig. murmurs, gallops, clicks, or rubs. Peripheral pulses normal and equal  without edema.  Abdomen: Soft & bowel sounds normal. Non-tender w/o guarding, rebound, hernias, masses, or organomegaly.  Lymphatics: Unremarkable.  Musculoskeletal: Full ROM all peripheral extremities, but with unstable gaity mild dystaxia of LE's.  Skin: Warm, dry without exposed rashes, lesions or ecchymosis apparent.  Neuro: Cranial nerves intact, KJ's & AJ's absent. Decreased sensory in a stocking distribution to the mid shins by monofilament testing. Non ambulatory in an elec scooter.  Pysch: Alert & oriented x 3.  Insight and judgement nl & appropriate. No ideations.  Assessment and Plan:  1. Essential hypertension  - TSH  2. Hyperlipidemia  - Lipid panel - TSH  3. Prediabetes  - Hemoglobin A1c - Insulin, random  4. Vitamin D deficiency  - VITAMIN D 25 Hydroxy   5. Idiopathic gout  - Uric acid  6. Gastroesophageal reflux disease   7. DJD   8. Morbid obesity, unspecified obesity type ([place])   9. Other abnormal glucose  -  Hemoglobin A1c - Insulin, random  10. Medication management  - CBC with Differential/Platelet - BASIC METABOLIC PANEL WITH GFR - Hepatic function panel - Magnesium  11. BMI 29.0-29.9,adult   Recommended regular exercise, BP monitoring, weight control, and discussed med and SE's. Recommended labs to assess and monitor clinical status. Further disposition pending results of labs. Over 30 minutes of exam, counseling, chart review was performed

## 2015-07-21 NOTE — Patient Instructions (Signed)

## 2015-07-22 LAB — VITAMIN D 25 HYDROXY (VIT D DEFICIENCY, FRACTURES): VIT D 25 HYDROXY: 57 ng/mL (ref 30–100)

## 2015-07-22 LAB — INSULIN, RANDOM: INSULIN: 8 u[IU]/mL (ref 2.0–19.6)

## 2015-08-04 ENCOUNTER — Ambulatory Visit: Payer: Non-veteran care | Admitting: Internal Medicine

## 2015-09-03 ENCOUNTER — Ambulatory Visit (INDEPENDENT_AMBULATORY_CARE_PROVIDER_SITE_OTHER): Payer: Medicare Other | Admitting: Internal Medicine

## 2015-09-03 ENCOUNTER — Encounter: Payer: Self-pay | Admitting: Internal Medicine

## 2015-09-03 VITALS — BP 146/82 | HR 64 | Temp 98.1°F | Resp 16

## 2015-09-03 DIAGNOSIS — C44320 Squamous cell carcinoma of skin of unspecified parts of face: Secondary | ICD-10-CM

## 2015-09-03 DIAGNOSIS — I10 Essential (primary) hypertension: Secondary | ICD-10-CM | POA: Diagnosis not present

## 2015-09-03 NOTE — Progress Notes (Signed)
  Subjective:    Patient ID: Tracy Brandt, male    DOB: 04-05-1930, 82 y.o.   MRN: [id number]  HPI  This nice 82 yo WWM w/HTN presents for BP recheck and BP is noted sl elevated today at 146/82. Systems review is negative for HA, dizziness, CP, palpitations or dependent edema. Also he has concerns about a pink bump on his R cheek which has rapidly increased in size over the last month.   Medication Sig  . VITAMIN D  Take 1 tablet by mouth daily.  . Diltiazem CD  180 MG 24 hr Take 1 capsule (180 mg total) by mouth daily.  Marland Kitchen doxazosin  8 MG Take 8 mg by mouth at bedtime.  . fish oil-omega-3  1000 MG Take 1 g by mouth daily.  . hctz (MICROZIDE) 12.5 MG  Take 1 capsule (12.5 mg total) by mouth daily.  Marland Kitchen lisinopril  40 MG  Take 1 tab in the morning and 1 tab in the evening or as directed.  . Magnesium 250 MG  Take 250 mg by mouth daily.  . ondansetron  4 MG  Take 1 tablet (4 mg total) by mouth every 6 (six) hours as needed.  . sertraline  100 MG  Take 50 mg by mouth daily.   No Known Allergies   Past Medical History  Diagnosis Date  . Hypertension   . Hyperlipidemia   . Prediabetes   . Arthritis   . GERD (gastroesophageal reflux disease)   . BPH (benign prostatic hyperplasia)   . Vitamin D deficiency   . Gout    Past Surgical History  Procedure Laterality Date  . Back surgery    . Leg surgery    . Hemorrhoid surgery    . Eye surgery     Review of Systems  10 point systems review negative except as above.    Objective:   Physical Exam  BP 146/82 mmHg  Pulse 64  Temp(Src) 98.1 F (36.7 C)  Resp 16  HEENT - Eac's patent. TM's Nl. EOM's full. PERRLA. NasoOroPharynx clear. Neck - supple. Nl Thyroid. Carotids 2+ & No bruits, nodes, JVD Chest - Clear equal BS w/o Rales, rhonchi, wheezes. Cor - Nl HS. RRR w/o sig MGR. PP 1(+). No edema. Abd - No palpable organomegaly, masses or tenderness. BS nl. MS- FROM w/o deformities. Muscle power, tone and bulk Nl. Gait Nl. Neuro - No  obvious Cr N abnormalities. Sensory, motor and Cerebellar functions appear Nl w/o focal abnormalities. Psyche - Mental status normal & appropriate.  No delusions, ideations or obvious mood abnormalities. Skin - there is a 10 x 10 mm pink exophytic lesion on the R cheek (projecting 10 mm) which is crusty with an umbilicated circumferential base.  Procedure: (CPT: [id number] )  - After informed consent & aseptic prep with alcohol and local anesthesia with 0.5 ml Marcaine 0.5% w/ epi , the lesion was sharply excised by shave excisional technique with a #10 scalpel. The specimen was sent for path analysis. Then the wound base was deeply hyfrecated for electro destruction of any remnant lesion fragments. Antibiotic sterile dressing was applied.     Assessment & Plan:   1. Essential hypertension   2. Squamous cell skin cancer, face  - Excise by shave excisional technique  - Dermatology pathology

## 2015-09-06 DIAGNOSIS — C44329 Squamous cell carcinoma of skin of other parts of face: Secondary | ICD-10-CM | POA: Diagnosis not present

## 2015-09-10 ENCOUNTER — Encounter: Payer: Self-pay | Admitting: Internal Medicine

## 2015-10-08 ENCOUNTER — Ambulatory Visit (INDEPENDENT_AMBULATORY_CARE_PROVIDER_SITE_OTHER): Payer: Medicare Other | Admitting: Internal Medicine

## 2015-10-08 ENCOUNTER — Encounter: Payer: Self-pay | Admitting: Internal Medicine

## 2015-10-08 VITALS — BP 134/62 | HR 64 | Temp 97.5°F | Resp 16

## 2015-10-08 DIAGNOSIS — I10 Essential (primary) hypertension: Secondary | ICD-10-CM | POA: Diagnosis not present

## 2015-10-08 DIAGNOSIS — C44320 Squamous cell carcinoma of skin of unspecified parts of face: Secondary | ICD-10-CM

## 2015-10-08 DIAGNOSIS — L309 Dermatitis, unspecified: Secondary | ICD-10-CM | POA: Diagnosis not present

## 2015-10-08 NOTE — Progress Notes (Deleted)
Patient ID: Tracy Brandt, male   DOB: 10/14/1929, 82 y.o.   MRN: [id number]

## 2015-10-08 NOTE — Progress Notes (Signed)
  Subjective:    Patient ID: Tracy Brandt, male    DOB: [birth year], 81 y.o.   MRN: [id number]  HPI A very nice 82 yo WWM with HTN presents for f/u and is doing well w/o c/o HA, dizziness, CP, palpitations, PND, orthopnea or edema. Patientg had recent shave bx R cheek (+) for WD SCC w/involved margins.   Medication Sig  . Cholecalciferol (VITAMIN D PO) Take 1 tablet by mouth daily.  Marland Kitchen diltiazem (CARDIZEM CD) 180 MG 24 hr capsule Take 1 capsule (180 mg total) by mouth daily.  Marland Kitchen doxazosin (CARDURA) 8 MG tablet Take 8 mg by mouth at bedtime.  . fish oil-omega-3 fatty acids 1000 MG capsule Take 1 g by mouth daily.  . hydrochlorothiazide (MICROZIDE) 12.5 MG capsule Take 1 capsule (12.5 mg total) by mouth daily.  Marland Kitchen lisinopril (PRINIVIL,ZESTRIL) 40 MG tablet Take 1 tab in the morning and 1 tab in the evening or as directed.  . Magnesium 250 MG TABS Take 250 mg by mouth daily.  . ondansetron (ZOFRAN) 4 MG tablet Take 1 tablet (4 mg total) by mouth every 6 (six) hours as needed.  . sertraline (ZOLOFT) 100 MG tablet Take 50 mg by mouth daily.   No Known Allergies   Past Medical History  Diagnosis Date  . Hypertension   . Hyperlipidemia   . Prediabetes   . Arthritis   . GERD (gastroesophageal reflux disease)   . BPH (benign prostatic hyperplasia)   . Vitamin D deficiency   . Gout    Review of Systems  10 point systems review negative except as above.    Objective:   Physical Exam  BP 134/62 mmHg  Pulse 64  Temp(Src) 97.5 F (36.4 C)  Resp 16  HEENT - Eac's patent. TM's Nl. EOM's full. PERRLA. NasoOroPharynx clear. Neck - supple. Nl Thyroid. Carotids 2+ & No bruits, nodes, JVD Chest - Clear equal BS w/o Rales, rhonchi, wheezes. Cor - Nl HS. RRR w/o sig MGR. PP 1(+). No edema. MS- FROM w/o deformities. Coordination/balance is poor & patient is on a scooter.  Neuro - No obvious Cr N abnormalities. Sensory, motor and Cerebellar functions appear Nl w/o focal abnormalities. Psyche -  Mental status normal & appropriate.  No delusions, ideations or obvious mood abnormalities.  Procedure (CPT-  [id number]) : After informed consent and aseptic prep with alcohol ans local anesthesia with  0.6 ml Marcaine 0.5% w/epi, the previous bx site of the R cheek was identified and excised with a #10 scalpel full thickness in a ellipiptical fashion. Wound edges were undermined and then approximated with # 4 sutures of nylon 4-0   and aligned with #  2 sutures of nylon 4-0. Antibiotic ung applied and sterile  2" x #" Tegaderm dressing applied. Wound care advised.      Assessment & Plan:   1. Essential hypertension   2. Squamous cell skin cancer, face  - Dermatology pathology

## 2015-10-13 IMAGING — CR DG HIP COMPLETE 2+V*R*
4 series · 4 of 4 positions shown · non-contrast
Comparison: None.

CLINICAL DATA: Right hip pain.

EXAM:
RIGHT HIP - COMPLETE 2+ VIEW

[view not recorded (1 of 4)]
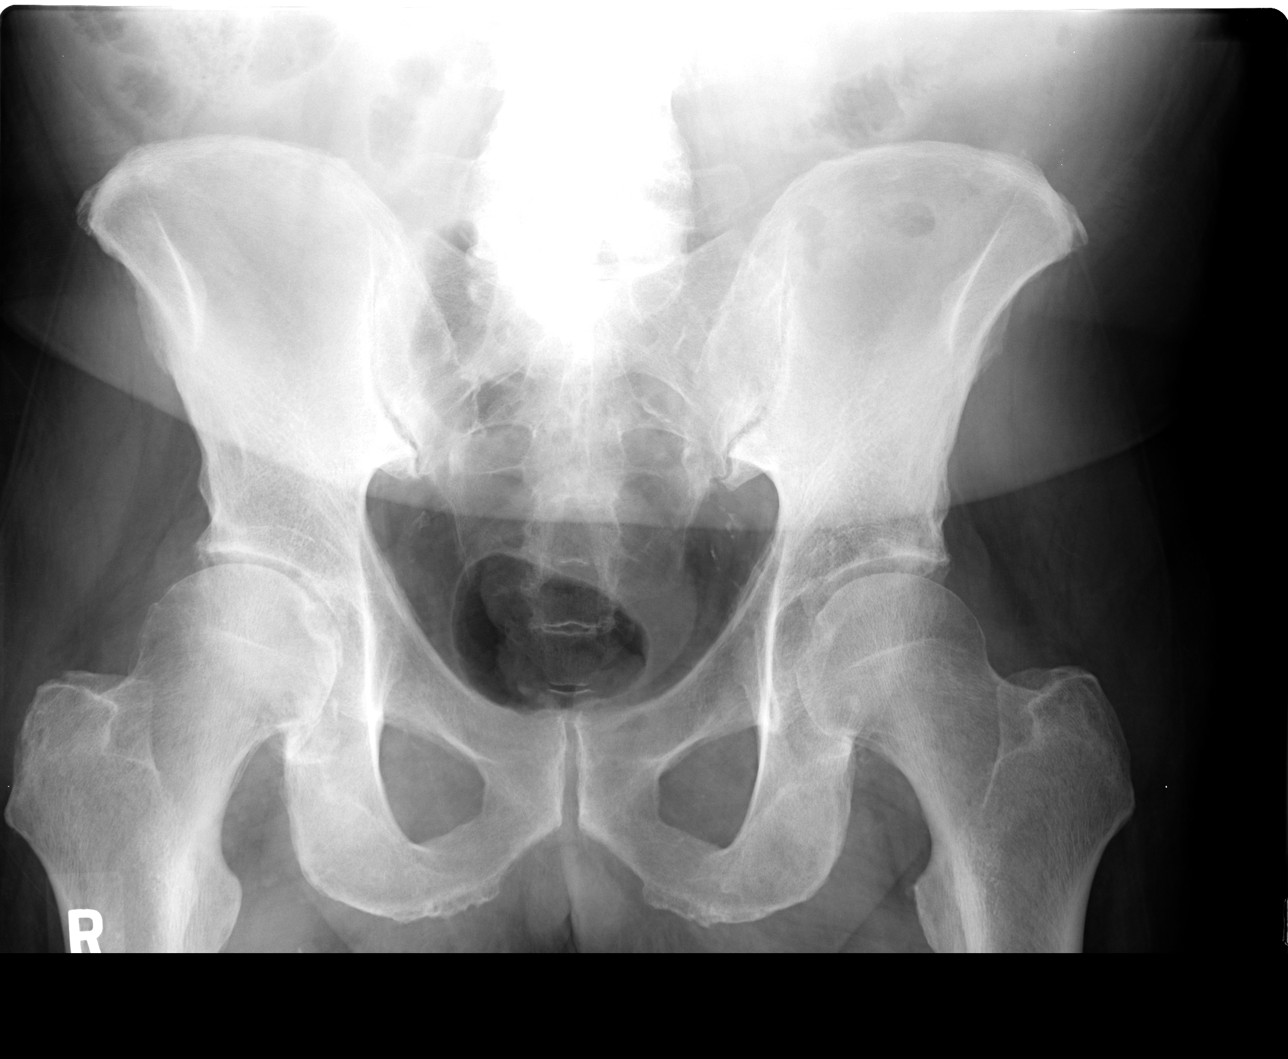

[view not recorded (2 of 4)]
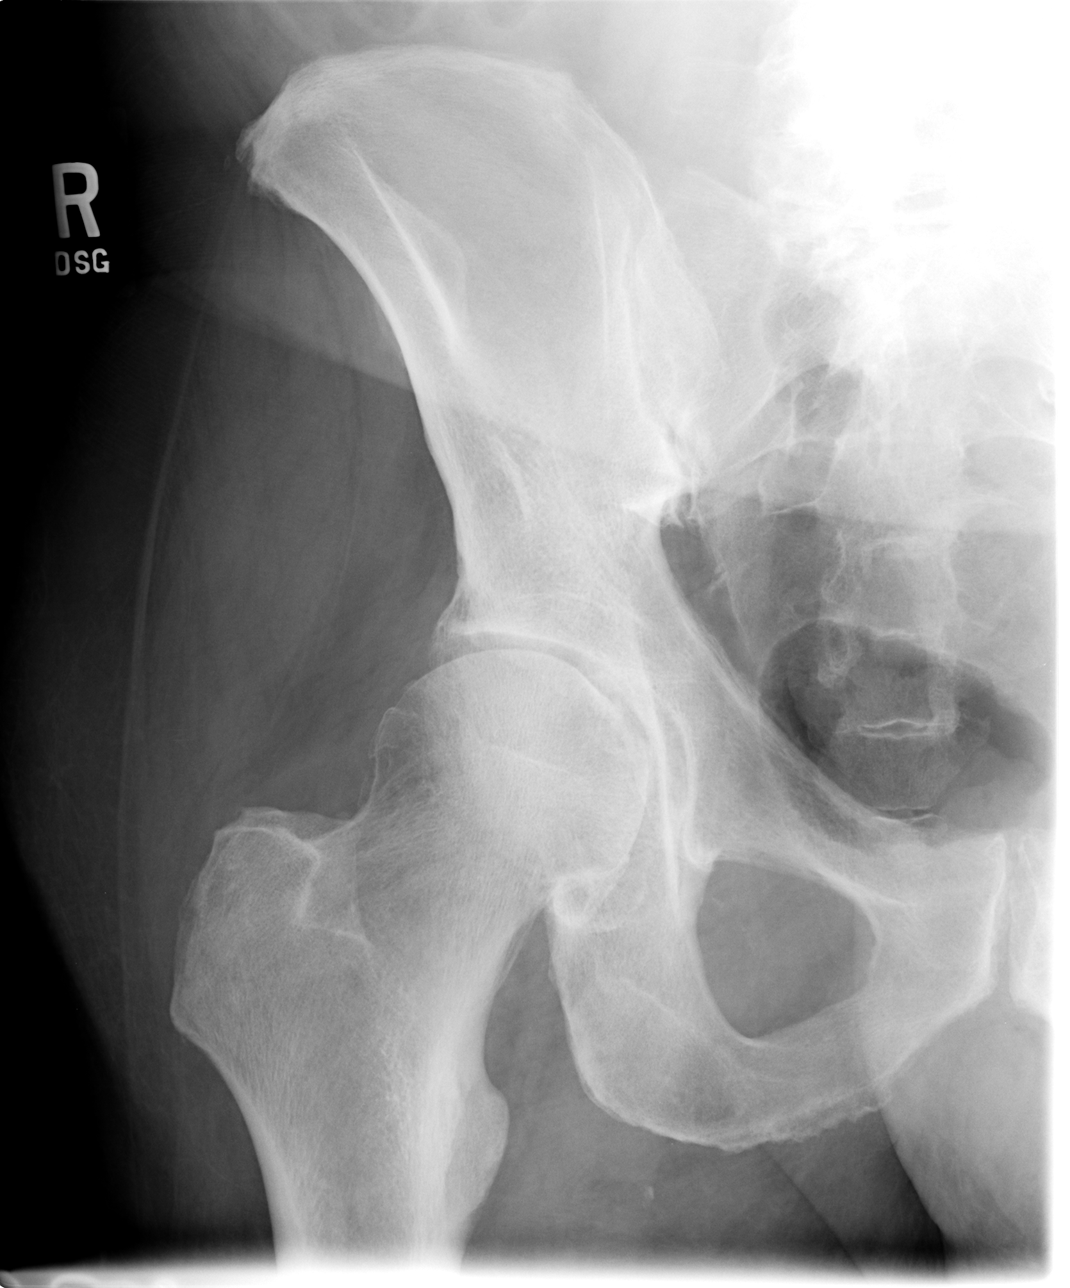

[view not recorded (3 of 4)]
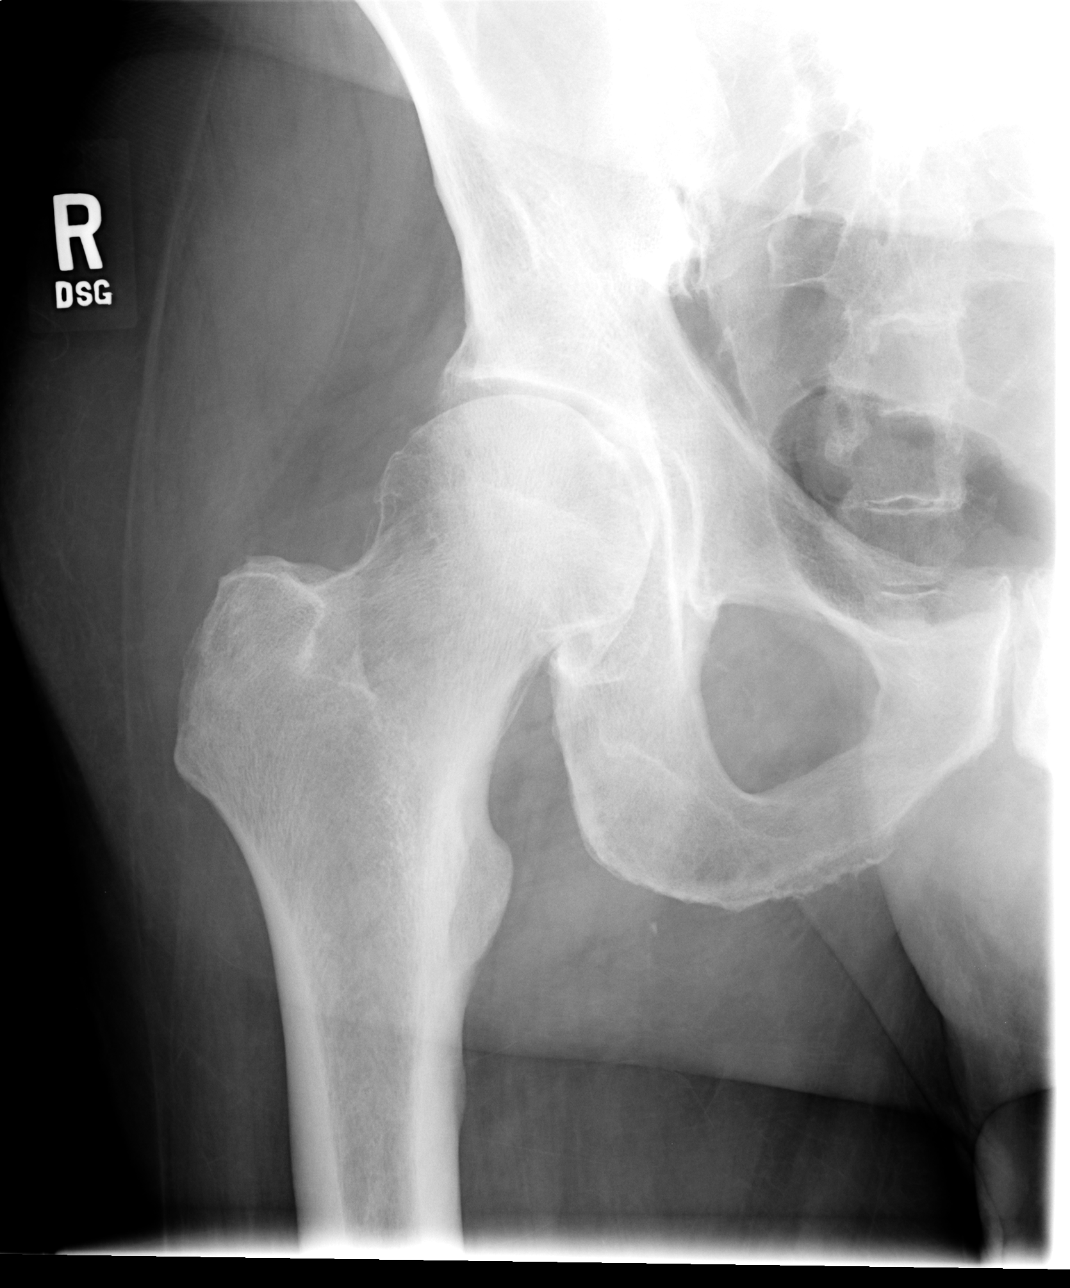

[view not recorded (4 of 4)]
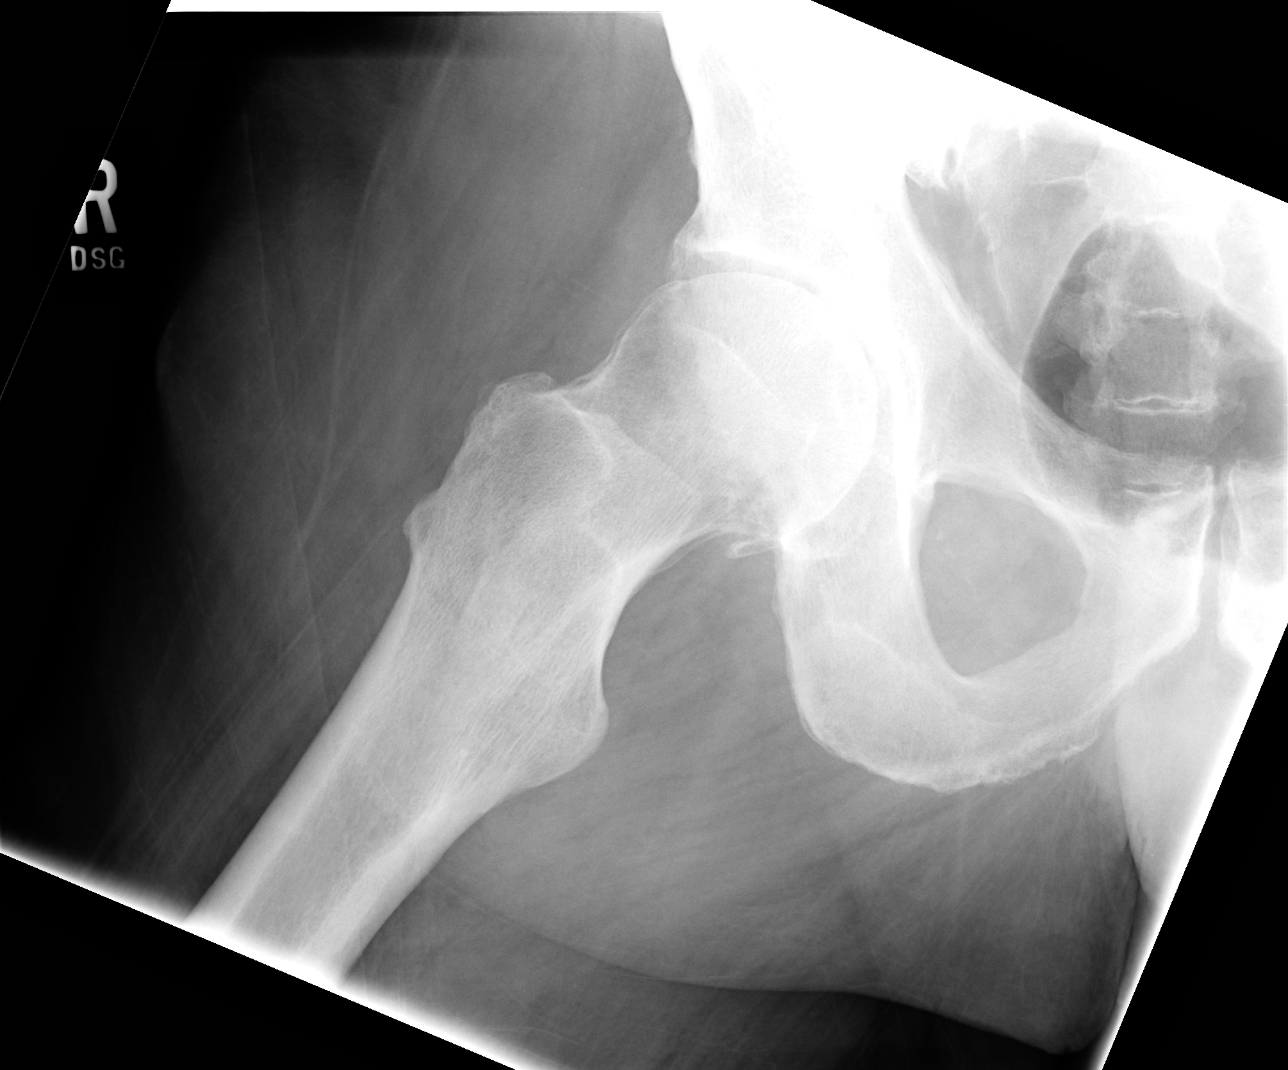

[4 of 4 positions shown; findings below may reference images not displayed]

FINDINGS: There is no evidence of hip fracture or dislocation. Moderate
narrowing of the joint space is noted with mild osteophyte
formation.
IMPRESSION: Moderate degenerative joint disease of the right hip. No fracture or
dislocation is noted.

## 2015-10-15 ENCOUNTER — Ambulatory Visit: Payer: Medicare Other | Admitting: Internal Medicine

## 2015-10-15 ENCOUNTER — Encounter: Payer: Self-pay | Admitting: Internal Medicine

## 2015-10-15 VITALS — BP 116/70 | HR 64 | Temp 97.7°F | Resp 16

## 2015-10-15 DIAGNOSIS — C44320 Squamous cell carcinoma of skin of unspecified parts of face: Secondary | ICD-10-CM

## 2015-10-15 NOTE — Progress Notes (Signed)
  Subjective:    Patient ID: Tracy Brandt, male    DOB: 1929/12/12, 82 y.o.   MRN: [id number]  HPI Patient returns 5 days ds/p re-excision of a Squamous Cell Ca of the R cheek for (+) margins for suture removal. Path showed no residual carcinoma.   Review of Systems    Objective:   Physical Exam  BP 116/70 mmHg  Pulse 64  Temp(Src) 97.7 F (36.5 C)  Resp 16  Wound well healed w/o sign of infection &sutures removed     Assessment & Plan:   (1) SCC of R cheek, s/p re-excision

## 2015-10-20 ENCOUNTER — Other Ambulatory Visit: Payer: Self-pay | Admitting: *Deleted

## 2015-10-20 MED ORDER — LISINOPRIL 40 MG PO TABS: ORAL_TABLET | ORAL | Status: DC

## 2015-10-25 ENCOUNTER — Ambulatory Visit: Payer: Self-pay | Admitting: Physician Assistant

## 2015-11-13 ENCOUNTER — Encounter: Payer: Self-pay | Admitting: *Deleted

## 2015-11-23 ENCOUNTER — Other Ambulatory Visit: Payer: Self-pay | Admitting: *Deleted

## 2015-11-23 MED ORDER — LISINOPRIL 40 MG PO TABS
ORAL_TABLET | ORAL | Status: DC
Start: 2015-11-23 — End: 2015-11-29

## 2015-11-29 ENCOUNTER — Other Ambulatory Visit: Payer: Self-pay | Admitting: *Deleted

## 2015-11-29 MED ORDER — LISINOPRIL 40 MG PO TABS: ORAL_TABLET | ORAL | Status: DC

## 2015-12-30 ENCOUNTER — Encounter: Payer: Self-pay | Admitting: Internal Medicine

## 2016-01-31 ENCOUNTER — Encounter: Payer: Self-pay | Admitting: Internal Medicine

## 2016-01-31 ENCOUNTER — Ambulatory Visit (INDEPENDENT_AMBULATORY_CARE_PROVIDER_SITE_OTHER): Payer: Medicare Other | Admitting: Internal Medicine

## 2016-01-31 ENCOUNTER — Encounter: Payer: Self-pay | Admitting: Physician Assistant

## 2016-01-31 VITALS — BP 128/86 | HR 72 | Temp 97.7°F | Resp 16 | Ht 71.0 in | Wt 200.6 lb

## 2016-01-31 DIAGNOSIS — Z1212 Encounter for screening for malignant neoplasm of rectum: Secondary | ICD-10-CM

## 2016-01-31 DIAGNOSIS — Z125 Encounter for screening for malignant neoplasm of prostate: Secondary | ICD-10-CM

## 2016-01-31 DIAGNOSIS — Z79899 Other long term (current) drug therapy: Secondary | ICD-10-CM | POA: Diagnosis not present

## 2016-01-31 DIAGNOSIS — I10 Essential (primary) hypertension: Secondary | ICD-10-CM | POA: Diagnosis not present

## 2016-01-31 DIAGNOSIS — N400 Enlarged prostate without lower urinary tract symptoms: Secondary | ICD-10-CM | POA: Diagnosis not present

## 2016-01-31 DIAGNOSIS — K219 Gastro-esophageal reflux disease without esophagitis: Secondary | ICD-10-CM

## 2016-01-31 DIAGNOSIS — Z136 Encounter for screening for cardiovascular disorders: Secondary | ICD-10-CM | POA: Diagnosis not present

## 2016-01-31 DIAGNOSIS — R7309 Other abnormal glucose: Secondary | ICD-10-CM | POA: Diagnosis not present

## 2016-01-31 DIAGNOSIS — I4891 Unspecified atrial fibrillation: Secondary | ICD-10-CM | POA: Insufficient documentation

## 2016-01-31 DIAGNOSIS — M1000 Idiopathic gout, unspecified site: Secondary | ICD-10-CM

## 2016-01-31 DIAGNOSIS — E785 Hyperlipidemia, unspecified: Secondary | ICD-10-CM | POA: Diagnosis not present

## 2016-01-31 DIAGNOSIS — I482 Chronic atrial fibrillation, unspecified: Secondary | ICD-10-CM

## 2016-01-31 DIAGNOSIS — M170 Bilateral primary osteoarthritis of knee: Secondary | ICD-10-CM

## 2016-01-31 DIAGNOSIS — N138 Other obstructive and reflux uropathy: Secondary | ICD-10-CM

## 2016-01-31 DIAGNOSIS — R7303 Prediabetes: Secondary | ICD-10-CM

## 2016-01-31 DIAGNOSIS — S82002A Unspecified fracture of left patella, initial encounter for closed fracture: Secondary | ICD-10-CM | POA: Insufficient documentation

## 2016-01-31 DIAGNOSIS — R2689 Other abnormalities of gait and mobility: Secondary | ICD-10-CM

## 2016-01-31 DIAGNOSIS — N401 Enlarged prostate with lower urinary tract symptoms: Secondary | ICD-10-CM

## 2016-01-31 DIAGNOSIS — E559 Vitamin D deficiency, unspecified: Secondary | ICD-10-CM

## 2016-01-31 DIAGNOSIS — G894 Chronic pain syndrome: Secondary | ICD-10-CM

## 2016-01-31 DIAGNOSIS — Z789 Other specified health status: Secondary | ICD-10-CM | POA: Insufficient documentation

## 2016-01-31 LAB — CBC WITH DIFFERENTIAL/PLATELET
BASOS ABS: 0 {cells}/uL (ref 0–200)
Basophils Relative: 0 %
EOS ABS: 66 {cells}/uL (ref 15–500)
EOS PCT: 1 %
HCT: 45.4 % (ref 38.5–50.0)
HEMOGLOBIN: 15.3 g/dL (ref 13.2–17.1)
LYMPHS ABS: 2046 {cells}/uL (ref 850–3900)
Lymphocytes Relative: 31 %
MCH: 32.1 pg (ref 27.0–33.0)
MCHC: 33.7 g/dL (ref 32.0–36.0)
MCV: 95.2 fL (ref 80.0–100.0)
MONOS PCT: 5 %
MPV: 10 fL (ref 7.5–12.5)
Monocytes Absolute: 330 cells/uL (ref 200–950)
NEUTROS PCT: 63 %
Neutro Abs: 4158 cells/uL (ref 1500–7800)
Platelets: 199 10*3/uL (ref 140–400)
RBC: 4.77 MIL/uL (ref 4.20–5.80)
RDW: 13.1 % (ref 11.0–15.0)
WBC: 6.6 10*3/uL (ref 3.8–10.8)

## 2016-01-31 LAB — URINALYSIS, ROUTINE W REFLEX MICROSCOPIC
BILIRUBIN URINE: NEGATIVE
GLUCOSE, UA: NEGATIVE
HGB URINE DIPSTICK: NEGATIVE
KETONES UR: NEGATIVE
LEUKOCYTES UA: NEGATIVE
Nitrite: NEGATIVE
PH: 7 (ref 5.0–8.0)
PROTEIN: NEGATIVE
Specific Gravity, Urine: 1.014 (ref 1.001–1.035)

## 2016-01-31 LAB — HEMOGLOBIN A1C
HEMOGLOBIN A1C: 5.6 % (ref <5.7)
MEAN PLASMA GLUCOSE: 114 mg/dL

## 2016-01-31 LAB — TSH: TSH: 1.06 m[IU]/L (ref 0.40–4.50)

## 2016-01-31 MED ORDER — TRAMADOL HCL 50 MG PO TABS: ORAL_TABLET | ORAL | Status: AC

## 2016-01-31 NOTE — Patient Instructions (Signed)
Recommend Adult Low Dose Aspirin or   coated  Aspirin 81 mg daily   To reduce risk of Colon Cancer 20 %,   Skin Cancer 26 % ,   Melanoma 46%   and   Pancreatic cancer 60%   ++++++++++++++++++++++++++++++++++++++++++++++++++++++ Vitamin D goal   is between 70-100.   Please make sure that you are taking your Vitamin D as directed.   It is very important as a natural anti-inflammatory   helping hair, skin, and nails, as well as reducing stroke and heart attack risk.   It helps your bones and helps with mood.  It also decreases numerous cancer risks so please take it as directed.   Low Vit D is associated with a 200-300% higher risk for CANCER   and 200-300% higher risk for HEART   ATTACK  &  STROKE.   .....................................Marland Kitchen  It is also associated with higher death rate at younger ages,   autoimmune diseases like Rheumatoid arthritis, Lupus, Multiple Sclerosis.     Also many other serious conditions, like depression, Alzheimer's  Dementia, infertility, muscle aches, fatigue, fibromyalgia - just to name a few.  ++++++++++++++++++++++++++++++++++++++++++++++++  Recommend the book "The END of DIETING" by Dr Excell Seltzer   & the book "The END of DIABETES " by Dr Excell Seltzer  At [email or web address] - get book & Audio CD's     Being diabetic has a  300% increased risk for heart attack, stroke, cancer, and alzheimer- type vascular dementia. It is very important that you work harder with diet by avoiding all foods that are white. Avoid white rice (brown & wild rice is OK), white potatoes (sweetpotatoes in moderation is OK), White bread or wheat bread or anything made out of white flour like bagels, donuts, rolls, buns, biscuits, cakes, pastries, cookies, pizza crust, and pasta (made from white flour & egg whites) - vegetarian pasta or spinach or wheat pasta is OK. Multigrain breads like Arnold's or Pepper[employer], or multigrain sandwich thins or flatbreads.  Diet,  exercise and weight loss can reverse and cure diabetes in the early stages.  Diet, exercise and weight loss is very important in the control and prevention of complications of diabetes which affects every system in your body, ie. Brain - dementia/stroke, eyes - glaucoma/blindness, heart - heart attack/heart failure, kidneys - dialysis, stomach - gastric paralysis, intestines - malabsorption, nerves - severe painful neuritis, circulation - gangrene & loss of a leg(s), and finally cancer and Alzheimers.    I recommend avoid fried & greasy foods,  sweets/candy, white rice (brown or wild rice or Quinoa is OK), white potatoes (sweet potatoes are OK) - anything made from white flour - bagels, doughnuts, rolls, buns, biscuits,white and wheat breads, pizza crust and traditional pasta made of white flour & egg white(vegetarian pasta or spinach or wheat pasta is OK).  Multi-grain bread is OK - like multi-grain flat bread or sandwich thins. Avoid alcohol in excess. Exercise is also important.    Eat all the vegetables you want - avoid meat, especially red meat and dairy - especially cheese.  Cheese is the most concentrated form of trans-fats which is the worst thing to clog up our arteries. Veggie cheese is OK which can be found in the fresh produce section at [employer] or [employer] or [employer]  ++++++++++++++++++++++++++++++++++++++++++++++++++ DASH Eating Plan  DASH stands for "Dietary Approaches to Stop Hypertension."   The DASH eating plan is a healthy eating plan that has been shown to reduce high blood  pressure (hypertension). Additional health benefits may include reducing the risk of type 2 diabetes mellitus, heart disease, and stroke. The DASH eating plan may also help with weight loss.  WHAT DO I NEED TO KNOW ABOUT THE DASH EATING PLAN?  For the DASH eating plan, you will follow these general guidelines:  Choose foods with a percent daily value for sodium of less than 5% (as listed on the food  label).  Use salt-free seasonings or herbs instead of table salt or sea salt.  Check with your health care provider or pharmacist before using salt substitutes.  Eat lower-sodium products, often labeled as "lower sodium" or "no salt added."  Eat fresh foods.  Eat more vegetables, fruits, and low-fat dairy products.    Choose whole grains. Look for the word "whole" as the first word in the ingredient list.  Choose fish   Limit sweets, desserts, sugars, and sugary drinks.  Choose heart-healthy fats.  Eat veggie cheese   Eat more home-cooked food and less restaurant, buffet, and fast food.  Limit fried foods.  [place]uffaker foods using methods other than frying.  Limit canned vegetables. If you do use them, rinse them well to decrease the sodium.  When eating at a restaurant, ask that your food be prepared with less salt, or no salt if possible.                      WHAT FOODS CAN I EAT?  Read Dr Fara Olden Fuhrman's books on The End of Dieting & The End of Diabetes  Grains  Whole grain or whole wheat bread. Brown rice. Whole grain or whole wheat pasta. Quinoa, bulgur, and whole grain cereals. Low-sodium cereals. Corn or whole wheat flour tortillas. Whole grain cornbread. Whole grain crackers. Low-sodium crackers.  Vegetables  Fresh or frozen vegetables (raw, steamed, roasted, or grilled). Low-sodium or reduced-sodium tomato and vegetable juices. Low-sodium or reduced-sodium tomato sauce and paste. Low-sodium or reduced-sodium canned vegetables.   Fruits  All fresh, canned (in natural juice), or frozen fruits.  Protein Products   All fish and seafood.  Dried beans, peas, or lentils. Unsalted nuts and seeds. Unsalted canned beans.  Dairy  Low-fat dairy products, such as skim or 1% milk, 2% or reduced-fat cheeses, low-fat ricotta or cottage cheese, or plain low-fat yogurt. Low-sodium or reduced-sodium cheeses.  Fats and Oils  Tub margarines without trans fats. Light or  reduced-fat mayonnaise and salad dressings (reduced sodium). Avocado. Safflower, olive, or canola oils. Natural peanut or almond butter.  Other  Unsalted popcorn and pretzels. The items listed above may not be a complete list of recommended foods or beverages. Contact your dietitian for more options.  +++++++++++++++++++++++++++++++++++++++++++  WHAT FOODS ARE NOT RECOMMENDED?  Grains/ White flour or wheat flour  White bread. White pasta. White rice. Refined cornbread. Bagels and croissants. Crackers that contain trans fat.  Vegetables  Creamed or fried vegetables. Vegetables in a . Regular canned vegetables. Regular canned tomato sauce and paste. Regular tomato and vegetable juices.  Fruits  Dried fruits. Canned fruit in light or heavy syrup. Fruit juice.  Meat and Other Protein Products  Meat in general - RED mwaet & White meat.  Fatty cuts of meat. Ribs, chicken wings, bacon, sausage, bologna, salami, chitterlings, fatback, hot dogs, bratwurst, and packaged luncheon meats.  Dairy  Whole or 2% milk, cream, half-and-half, and cream cheese. Whole-fat or sweetened yogurt. Full-fat cheeses or blue cheese. Nondairy creamers and whipped toppings. Processed cheese, cheese spreads, or  cheese curds.  Condiments  Onion and garlic salt, seasoned salt, table salt, and sea salt. Canned and packaged gravies. Worcestershire sauce. Tartar sauce. Barbecue sauce. Teriyaki sauce. Soy sauce, including reduced sodium. Steak sauce. Fish sauce. Oyster sauce. Cocktail sauce. Horseradish. Ketchup and mustard. Meat flavorings and tenderizers. Bouillon cubes. Hot sauce. Tabasco sauce. Marinades. Taco seasonings. Relishes.  Fats and Oils Butter, stick margarine, lard, shortening and bacon fat. Coconut, palm kernel, or palm oils. Regular salad dressings.  Pickles and olives. Salted popcorn and pretzels.  The items listed above may not be a complete list of foods and beverages to avoid.  Preventive  Care for Adults A healthy lifestyle and preventive care can promote health and wellness. Preventive health guidelines for men include the following key practices:  A routine yearly physical is a good way to check with your health care provider about your health and preventative screening. It is a chance to share any concerns and updates on your health and to receive a thorough exam.  Visit your dentist for a routine exam and preventative care every 6 months. Brush your teeth twice a day and floss once a day. Good oral hygiene prevents tooth decay and gum disease.  The frequency of eye exams is based on your age, health, family medical history, use of contact lenses, and other factors. Follow your health care provider's recommendations for frequency of eye exams.  Eat a healthy diet. Foods such as vegetables, fruits, whole grains, low-fat dairy products, and lean protein foods contain the nutrients you need without too many calories. Decrease your intake of foods high in solid fats, added sugars, and salt. Eat the right amount of calories for you.Get information about a proper diet from your health care provider, if necessary.  Regular physical exercise is one of the most important things you can do for your health. Most adults should get at least 150 minutes of moderate-intensity exercise (any activity that increases your heart rate and causes you to sweat) each week. In addition, most adults need muscle-strengthening exercises on 2 or more days a week.  Maintain a healthy weight. The body mass index (BMI) is a screening tool to identify possible weight problems. It provides an estimate of body fat based on height and weight. Your health care provider can find your BMI and can help you achieve or maintain a healthy weight.For adults 20 years and older:  A BMI below 18.5 is considered underweight.  A BMI of 18.5 to 24.9 is normal.  A BMI of 25 to 29.9 is considered overweight.  A BMI of 30 and  above is considered obese.  Maintain normal blood lipids and cholesterol levels by exercising and minimizing your intake of saturated fat. Eat a balanced diet with plenty of fruit and vegetables. Blood tests for lipids and cholesterol should begin at age 81 and be repeated every 5 years. If your lipid or cholesterol levels are high, you are over 50, or you are at high risk for heart disease, you may need your cholesterol levels checked more frequently.Ongoing high lipid and cholesterol levels should be treated with medicines if diet and exercise are not working.  If you smoke, find out from your health care provider how to quit. If you do not use tobacco, do not start.  Lung cancer screening is recommended for adults aged 44-80 years who are at high risk for developing lung cancer because of a history of smoking. A yearly low-dose CT scan of the lungs is recommended for  people who have at least a 30-pack-year history of smoking and are a current smoker or have quit within the past 15 years. A pack year of smoking is smoking an average of 1 pack of cigarettes a day for 1 year (for example: 1 pack a day for 30 years or 2 packs a day for 15 years). Yearly screening should continue until the smoker has stopped smoking for at least 15 years. Yearly screening should be stopped for people who develop a health problem that would prevent them from having lung cancer treatment.  If you choose to drink alcohol, do not have more than 2 drinks per day. One drink is considered to be 12 ounces (355 mL) of beer, 5 ounces (148 mL) of wine, or 1.5 ounces (44 mL) of liquor.  Avoid use of street drugs. Do not share needles with anyone. Ask for help if you need support or instructions about stopping the use of drugs.  High blood pressure causes heart disease and increases the risk of stroke. Your blood pressure should be checked at least every 1-2 years. Ongoing high blood pressure should be treated with medicines, if  weight loss and exercise are not effective.  If you are 45-79 years old, ask your health care provider if you should take aspirin to prevent heart disease.  Diabetes screening involves taking a blood sample to check your fasting blood sugar level. This should be done once every 3 years, after age 45, if you are within normal weight and without risk factors for diabetes. Testing should be considered at a younger age or be carried out more frequently if you are overweight and have at least 1 risk factor for diabetes.  Colorectal cancer can be detected and often prevented. Most routine colorectal cancer screening begins at the age of 50 and continues through age 75. However, your health care provider may recommend screening at an earlier age if you have risk factors for colon cancer. On a yearly basis, your health care provider may provide home test kits to check for hidden blood in the stool. Use of a small camera at the end of a tube to directly examine the colon (sigmoidoscopy or colonoscopy) can detect the earliest forms of colorectal cancer. Talk to your health care provider about this at age 50, when routine screening begins. Direct exam of the colon should be repeated every 5-10 years through age 75, unless early forms of precancerous polyps or small growths are found.  People who are at an increased risk for hepatitis B should be screened for this virus. You are considered at high risk for hepatitis B if:  You were born in a country where hepatitis B occurs often. Talk with your health care provider about which countries are considered high risk.  Your parents were born in a high-risk country and you have not received a shot to protect against hepatitis B (hepatitis B vaccine).  You have HIV or AIDS.  You use needles to inject street drugs.  You live with, or have sex with, someone who has hepatitis B.  You are a man who has sex with other men (MSM).  You get hemodialysis treatment.  You  take certain medicines for conditions such as cancer, organ transplantation, and autoimmune conditions.  Hepatitis C blood testing is recommended for all people born from 1945 through 1965 and any individual with known risks for hepatitis C.  Practice safe sex. Use condoms and avoid high-risk sexual practices to reduce the spread of   sexually transmitted infections (STIs). STIs include gonorrhea, chlamydia, syphilis, trichomonas, herpes, HPV, and human immunodeficiency virus (HIV). Herpes, HIV, and HPV are viral illnesses that have no cure. They can result in disability, cancer, and death.  If you are at risk of being infected with HIV, it is recommended that you take a prescription medicine daily to prevent HIV infection. This is called preexposure prophylaxis (PrEP). You are considered at risk if:  You are a man who has sex with other men (MSM) and have other risk factors.  You are a heterosexual man, are sexually active, and are at increased risk for HIV infection.  You take drugs by injection.  You are sexually active with a partner who has HIV.  Talk with your health care provider about whether you are at high risk of being infected with HIV. If you choose to begin PrEP, you should first be tested for HIV. You should then be tested every 3 months for as long as you are taking PrEP.  A one-time screening for abdominal aortic aneurysm (AAA) and surgical repair of large AAAs by ultrasound are recommended for men ages 48 to 46 years who are current or former smokers.  Healthy men should no longer receive prostate-specific antigen (PSA) blood tests as part of routine cancer screening. Talk with your health care provider about prostate cancer screening.  Testicular cancer screening is not recommended for adult males who have no symptoms. Screening includes self-exam, a health care provider exam, and other screening tests. Consult with your health care provider about any symptoms you have or any  concerns you have about testicular cancer.  Use sunscreen. Apply sunscreen liberally and repeatedly throughout the day. You should seek shade when your shadow is shorter than you. Protect yourself by wearing long sleeves, pants, a wide-brimmed hat, and sunglasses year round, whenever you are outdoors.  Once a month, do a whole-body skin exam, using a mirror to look at the skin on your back. Tell your health care provider about new moles, moles that have irregular borders, moles that are larger than a pencil eraser, or moles that have changed in shape or color.  Stay current with required vaccines (immunizations).  Influenza vaccine. All adults should be immunized every year.  Tetanus, diphtheria, and acellular pertussis (Td, Tdap) vaccine. An adult who has not previously received Tdap or who does not know his vaccine status should receive 1 dose of Tdap. This initial dose should be followed by tetanus and diphtheria toxoids (Td) booster doses every 10 years. Adults with an unknown or incomplete history of completing a 3-dose immunization series with Td-containing vaccines should begin or complete a primary immunization series including a Tdap dose. Adults should receive a Td booster every 10 years.  Varicella vaccine. An adult without evidence of immunity to varicella should receive 2 doses or a second dose if he has previously received 1 dose.  Human papillomavirus (HPV) vaccine. Males aged 63-21 years who have not received the vaccine previously should receive the 3-dose series. Males aged 22-26 years may be immunized. Immunization is recommended through the age of 90 years for any male who has sex with males and did not get any or all doses earlier. Immunization is recommended for any person with an immunocompromised condition through the age of 49 years if he did not get any or all doses earlier. During the 3-dose series, the second dose should be obtained 4-8 weeks after the first dose. The third  dose should be obtained 24  weeks after the first dose and 16 weeks after the second dose.  Zoster vaccine. One dose is recommended for adults aged 60 years or older unless certain conditions are present.  Measles, mumps, and rubella (MMR) vaccine. Adults born before 1957 generally are considered immune to measles and mumps. Adults born in 1957 or later should have 1 or more doses of MMR vaccine unless there is a contraindication to the vaccine or there is laboratory evidence of immunity to each of the three diseases. A routine second dose of MMR vaccine should be obtained at least 28 days after the first dose for students attending postsecondary schools, health care workers, or international travelers. People who received inactivated measles vaccine or an unknown type of measles vaccine during 1963-1967 should receive 2 doses of MMR vaccine. People who received inactivated mumps vaccine or an unknown type of mumps vaccine before 1979 and are at high risk for mumps infection should consider immunization with 2 doses of MMR vaccine. Unvaccinated health care workers born before 1957 who lack laboratory evidence of measles, mumps, or rubella immunity or laboratory confirmation of disease should consider measles and mumps immunization with 2 doses of MMR vaccine or rubella immunization with 1 dose of MMR vaccine.  Pneumococcal 13-valent conjugate (PCV13) vaccine. When indicated, a person who is uncertain of his immunization history and has no record of immunization should receive the PCV13 vaccine. An adult aged 19 years or older who has certain medical conditions and has not been previously immunized should receive 1 dose of PCV13 vaccine. This PCV13 should be followed with a dose of pneumococcal polysaccharide (PPSV23) vaccine. The PPSV23 vaccine dose should be obtained at least 8 weeks after the dose of PCV13 vaccine. An adult aged 19 years or older who has certain medical conditions and previously received 1 or  more doses of PPSV23 vaccine should receive 1 dose of PCV13. The PCV13 vaccine dose should be obtained 1 or more years after the last PPSV23 vaccine dose.  Pneumococcal polysaccharide (PPSV23) vaccine. When PCV13 is also indicated, PCV13 should be obtained first. All adults aged 65 years and older should be immunized. An adult younger than age 65 years who has certain medical conditions should be immunized. Any person who resides in a nursing home or long-term care facility should be immunized. An adult smoker should be immunized. People with an immunocompromised condition and certain other conditions should receive both PCV13 and PPSV23 vaccines. People with human immunodeficiency virus (HIV) infection should be immunized as soon as possible after diagnosis. Immunization during chemotherapy or radiation therapy should be avoided. Routine use of PPSV23 vaccine is not recommended for American Indians, [place] Natives, or people younger than 65 years unless there are medical conditions that require PPSV23 vaccine. When indicated, people who have unknown immunization and have no record of immunization should receive PPSV23 vaccine. One-time revaccination 5 years after the first dose of PPSV23 is recommended for people aged 19-64 years who have chronic kidney failure, nephrotic syndrome, asplenia, or immunocompromised conditions. People who received 1-2 doses of PPSV23 before age 65 years should receive another dose of PPSV23 vaccine at age 65 years or later if at least 5 years have passed since the previous dose. Doses of PPSV23 are not needed for people immunized with PPSV23 at or after age 65 years.  Meningococcal vaccine. Adults with asplenia or persistent complement component deficiencies should receive 2 doses of quadrivalent meningococcal conjugate (MenACWY-D) vaccine. The doses should be obtained at least 2 months apart. Microbiologists   working with certain meningococcal bacteria, [employer], people at  risk during an outbreak, and people who travel to or live in countries with a high rate of meningitis should be immunized. A first-year college student up through age 75 years who is living in a residence hall should receive a dose if he did not receive a dose on or after his 16th birthday. Adults who have certain high-risk conditions should receive one or more doses of vaccine.  Hepatitis A vaccine. Adults who wish to be protected from this disease, have certain high-risk conditions, work with hepatitis A-infected animals, work in hepatitis A research labs, or travel to or work in countries with a high rate of hepatitis A should be immunized. Adults who were previously unvaccinated and who anticipate close contact with an international adoptee during the first 60 days after arrival in the [place] from a country with a high rate of hepatitis A should be immunized.  Hepatitis B vaccine. Adults should be immunized if they wish to be protected from this disease, have certain high-risk conditions, may be exposed to blood or other infectious body fluids, are household contacts or sex partners of hepatitis B positive people, are clients or workers in certain care facilities, or travel to or work in countries with a high rate of hepatitis B.  Haemophilus influenzae type b (Hib) vaccine. A previously unvaccinated person with asplenia or sickle cell disease or having a scheduled splenectomy should receive 1 dose of Hib vaccine. Regardless of previous immunization, a recipient of a hematopoietic stem cell transplant should receive a 3-dose series 6-12 months after his successful transplant. Hib vaccine is not recommended for adults with HIV infection. Preventive Service / Frequency Ages 71 to 69  Blood pressure check.** / Every 1 to 2 years.  Lipid and cholesterol check.** / Every 5 years beginning at age 84.  Hepatitis C blood test.** / For any individual with known risks for hepatitis C.  Skin  self-exam. / Monthly.  Influenza vaccine. / Every year.  Tetanus, diphtheria, and acellular pertussis (Tdap, Td) vaccine.** / Consult your health care provider. 1 dose of Td every 10 years.  Varicella vaccine.** / Consult your health care provider.  HPV vaccine. / 3 doses over 6 months, if 45 or younger.  Measles, mumps, rubella (MMR) vaccine.** / You need at least 1 dose of MMR if you were born in 1957 or later. You may also need a second dose.  Pneumococcal 13-valent conjugate (PCV13) vaccine.** / Consult your health care provider.  Pneumococcal polysaccharide (PPSV23) vaccine.** / 1 to 2 doses if you smoke cigarettes or if you have certain conditions.  Meningococcal vaccine.** / 1 dose if you are age 67 to 52 years and a Market researcher living in a residence hall, or have one of several medical conditions. You may also need additional booster doses.  Hepatitis A vaccine.** / Consult your health care provider.  Hepatitis B vaccine.** / Consult your health care provider.  Haemophilus influenzae type b (Hib) vaccine.** / Consult your health care provider. Ages 35 to 41  Blood pressure check.** / Every 1 to 2 years.  Lipid and cholesterol check.** / Every 5 years beginning at age 45.  Lung cancer screening. / Every year if you are aged 33-80 years and have a 30-pack-year history of smoking and currently smoke or have quit within the past 15 years. Yearly screening is stopped once you have quit smoking for at least 15 years or develop a health problem  that would prevent you from having lung cancer treatment.  Fecal occult blood test (FOBT) of stool. / Every year beginning at age 63 and continuing until age 67. You may not have to do this test if you get a colonoscopy every 10 years.  Flexible sigmoidoscopy** or colonoscopy.** / Every 5 years for a flexible sigmoidoscopy or every 10 years for a colonoscopy beginning at age 90 and continuing until age 50.  Hepatitis C blood  test.** / For all people born from [birth year] through 1965 and any individual with known risks for hepatitis C.  Skin self-exam. / Monthly.  Influenza vaccine. / Every year.  Tetanus, diphtheria, and acellular pertussis (Tdap/Td) vaccine.** / Consult your health care provider. 1 dose of Td every 10 years.  Varicella vaccine.** / Consult your health care provider.  Zoster vaccine.** / 1 dose for adults aged 45 years or older.  Measles, mumps, rubella (MMR) vaccine.** / You need at least 1 dose of MMR if you were born in 1957 or later. You may also need a second dose.  Pneumococcal 13-valent conjugate (PCV13) vaccine.** / Consult your health care provider.  Pneumococcal polysaccharide (PPSV23) vaccine.** / 1 to 2 doses if you smoke cigarettes or if you have certain conditions.  Meningococcal vaccine.** / Consult your health care provider.  Hepatitis A vaccine.** / Consult your health care provider.  Hepatitis B vaccine.** / Consult your health care provider.  Haemophilus influenzae type b (Hib) vaccine.** / Consult your health care provider. Ages 75 and over  Blood pressure check.** / Every 1 to 2 years.  Lipid and cholesterol check.**/ Every 5 years beginning at age 36.  Lung cancer screening. / Every year if you are aged 66-80 years and have a 30-pack-year history of smoking and currently smoke or have quit within the past 15 years. Yearly screening is stopped once you have quit smoking for at least 15 years or develop a health problem that would prevent you from having lung cancer treatment.  Fecal occult blood test (FOBT) of stool. / Every year beginning at age 75 and continuing until age 78. You may not have to do this test if you get a colonoscopy every 10 years.  Flexible sigmoidoscopy** or colonoscopy.** / Every 5 years for a flexible sigmoidoscopy or every 10 years for a colonoscopy beginning at age 50 and continuing until age 66.  Hepatitis C blood test.** / For all people born  from [birth year] through 1965 and any individual with known risks for hepatitis C.  Abdominal aortic aneurysm (AAA) screening./ Screening current or former smokers or have Hypertension.  Skin self-exam. / Monthly.  Influenza vaccine. / Every year.  Tetanus, diphtheria, and acellular pertussis (Tdap/Td) vaccine.** / 1 dose of Td every 10 years.  Varicella vaccine.** / Consult your health care provider.  Zoster vaccine.** / 1 dose for adults aged 67 years or older.  Pneumococcal 13-valent conjugate (PCV13) vaccine.** / Consult your health care provider.  Pneumococcal polysaccharide (PPSV23) vaccine.** / 1 dose for all adults aged 69 years and older.  Meningococcal vaccine.** / Consult your health care provider.  Hepatitis A vaccine.** / Consult your health care provider.  Hepatitis B vaccine.** / Consult your health care provider.  Haemophilus influenzae type b (Hib) vaccine.** / Consult your health care provider.  Health Maintenance A healthy lifestyle and preventative care can promote health and wellness. Maintain regular health, dental, and eye exams. Eat a healthy diet. Foods like vegetables, fruits, whole grains, low-fat dairy products, and lean protein  foods contain the nutrients you need and are low in calories. Decrease your intake of foods high in solid fats, added sugars, and salt. Get information about a proper diet from your health care provider, if necessary. Regular physical exercise is one of the most important things you can do for your health. Most adults should get at least 150 minutes of moderate-intensity exercise (any activity that increases your heart rate and causes you to sweat) each week. In addition, most adults need muscle-strengthening exercises on 2 or more days a week.  Maintain a healthy weight. The body mass index (BMI) is a screening tool to identify possible weight problems. It provides an estimate of body fat based on height and weight. Your health care  provider can find your BMI and can help you achieve or maintain a healthy weight. For males 20 years and older: A BMI below 18.5 is considered underweight. A BMI of 18.5 to 24.9 is normal. A BMI of 25 to 29.9 is considered overweight. A BMI of 30 and above is considered obese. Maintain normal blood lipids and cholesterol by exercising and minimizing your intake of saturated fat. Eat a balanced diet with plenty of fruits and vegetables. Blood tests for lipids and cholesterol should begin at age 20 and be repeated every 5 years. If your lipid or cholesterol levels are high, you are over age 50, or you are at high risk for heart disease, you may need your cholesterol levels checked more frequently.Ongoing high lipid and cholesterol levels should be treated with medicines if diet and exercise are not working. If you smoke, find out from your health care provider how to quit. If you do not use tobacco, do not start. Lung cancer screening is recommended for adults aged 55-80 years who are at high risk for developing lung cancer because of a history of smoking. A yearly low-dose CT scan of the lungs is recommended for people who have at least a 30-pack-year history of smoking and are current smokers or have quit within the past 15 years. A pack year of smoking is smoking an average of 1 pack of cigarettes a day for 1 year (for example, a 30-pack-year history of smoking could mean smoking 1 pack a day for 30 years or 2 packs a day for 15 years). Yearly screening should continue until the smoker has stopped smoking for at least 15 years. Yearly screening should be stopped for people who develop a health problem that would prevent them from having lung cancer treatment. If you choose to drink alcohol, do not have more than 2 drinks per day. One drink is considered to be 12 oz (360 mL) of beer, 5 oz (150 mL) of wine, or 1.5 oz (45 mL) of liquor. Avoid the use of street drugs. Do not share needles with anyone. Ask for  help if you need support or instructions about stopping the use of drugs. High blood pressure causes heart disease and increases the risk of stroke. Blood pressure should be checked at least every 1-2 years. Ongoing high blood pressure should be treated with medicines if weight loss and exercise are not effective. If you are 45-79 years old, ask your health care provider if you should take aspirin to prevent heart disease. Diabetes screening involves taking a blood sample to check your fasting blood sugar level. This should be done once every 3 years after age 45 if you are at a normal weight and without risk factors for diabetes. Testing should be considered   at a younger age or be carried out more frequently if you are overweight and have at least 1 risk factor for diabetes. Colorectal cancer can be detected and often prevented. Most routine colorectal cancer screening begins at the age of 68 and continues through age 51. However, your health care provider may recommend screening at an earlier age if you have risk factors for colon cancer. On a yearly basis, your health care provider may provide home test kits to check for hidden blood in the stool. A small camera at the end of a tube may be used to directly examine the colon (sigmoidoscopy or colonoscopy) to detect the earliest forms of colorectal cancer. Talk to your health care provider about this at age 19 when routine screening begins. A direct exam of the colon should be repeated every 5-10 years through age 57, unless early forms of precancerous polyps or small growths are found. People who are at an increased risk for hepatitis B should be screened for this virus. You are considered at high risk for hepatitis B if: You were born in a country where hepatitis B occurs often. Talk with your health care provider about which countries are considered high risk. Your parents were born in a high-risk country and you have not received a shot to protect  against hepatitis B (hepatitis B vaccine). You have HIV or AIDS. You use needles to inject street drugs. You live with, or have sex with, someone who has hepatitis B. You are a man who has sex with other men (MSM). You get hemodialysis treatment. You take certain medicines for conditions like cancer, organ transplantation, and autoimmune conditions. Hepatitis C blood testing is recommended for all people born from [birth year] through 1965 and any individual with known risk factors for hepatitis C. Healthy men should no longer receive prostate-specific antigen (PSA) blood tests as part of routine cancer screening. Talk to your health care provider about prostate cancer screening. Testicular cancer screening is not recommended for adolescents or adult males who have no symptoms. Screening includes self-exam, a health care provider exam, and other screening tests. Consult with your health care provider about any symptoms you have or any concerns you have about testicular cancer. Practice safe sex. Use condoms and avoid high-risk sexual practices to reduce the spread of sexually transmitted infections (STIs). You should be screened for STIs, including gonorrhea and chlamydia if: You are sexually active and are younger than 24 years. You are older than 24 years, and your health care provider tells you that you are at risk for this type of infection. Your sexual activity has changed since you were last screened, and you are at an increased risk for chlamydia or gonorrhea. Ask your health care provider if you are at risk. If you are at risk of being infected with HIV, it is recommended that you take a prescription medicine daily to prevent HIV infection. This is called pre-exposure prophylaxis (PrEP). You are considered at risk if: You are a man who has sex with other men (MSM). You are a heterosexual man who is sexually active with multiple partners. You take drugs by injection. You are sexually active with a  partner who has HIV. Talk with your health care provider about whether you are at high risk of being infected with HIV. If you choose to begin PrEP, you should first be tested for HIV. You should then be tested every 3 months for as long as you are taking PrEP. Use sunscreen. Apply sunscreen  liberally and repeatedly throughout the day. You should seek shade when your shadow is shorter than you. Protect yourself by wearing long sleeves, pants, a wide-brimmed hat, and sunglasses year round whenever you are outdoors. Tell your health care provider of new moles or changes in moles, especially if there is a change in shape or color. Also, tell your health care provider if a mole is larger than the size of a pencil eraser. Stay current with your vaccines (immunizations).   

## 2016-01-31 NOTE — Progress Notes (Signed)
Patient ID: Tracy Brandt, male   DOB: 03-Sep-1929, 82 y.o.   MRN: [id number]  [facility]   Unk Pinto, M.D.    Uvaldo Bristle. Silverio Lay, P.A.-C      Starlyn Skeans, P.A.-C   [facility]                [address] [place], [place] [id number] Telephone [phone] Telefax [phone] _________________________________  Comprehensive Evaluation & Examination     This very nice 82 y.o. WWM presents for a  comprehensive evaluation and management of multiple medical co-morbidities.  Patient has been followed for HTN, Prediabetes, Hyperlipidemia and Vitamin D Deficiency.     Patient has c/o chronic daily severe pains in his knees precluding him from standing and the [place] has supplied him with a scooter. He has been dx'd with a  Peripheral neuropathy and and undefined balance disorder. Head CTscan,  PNCV's,  EMG as well as studies for myasthenia gravis were neg  in 2011.        HTN predates since 16. Patient's BP has been controlled at home.Today's BP: 128/86 mmHg. Patient denies any cardiac symptoms as chest pain, palpitations, shortness of breath, dizziness or ankle swelling.     Patient's hyperlipidemia is controlled with diet as he has been intolerant to Statind in the past. Patient denies myalgias or other medication SE's. Last lipids were at goal with  Cholesterol 160; HDL 54; LDL 77; Triglycerides 145 on 07/21/2015.     Patient has prediabetes since Mar 2011 wih A1c 6.0% and patient denies reactive hypoglycemic symptoms, visual blurring, diabetic polys or paresthesias. Last A1c was at goal with 5.6% on 07/21/2015.        Finally, patient has history of Vitamin D Deficiency of "45" in 2008 and last vitamin D was  57 on 07/21/2015.  Medication Sig  . VITAMIN D  Take 1 tablet by mouth daily.  Marland Kitchen diltiazem CD 180 MG 24 hr Take 1 capsule (180 mg total) by mouth daily.  Marland Kitchen doxazosin  8 MG  Take 8 mg by mouth  at bedtime.  . fish oil-omega-3  1000 MG  Take 1 g by mouth daily.  . hctz  12.5 MG  Take 1 capsule (12.5 mg total) by mouth daily.  Marland Kitchen lisinopril  40 MG Take 1 tab in the morning and 1 tab in the evening or as directed.  . Magnesium 250 MG  Take 250 mg by mouth 2 (two) times daily.   . ondansetron  4 MG Take 1 tablet (4 mg total) by mouth every 6 (six) hours as needed.  . sertraline  100 MG  Take 50 mg by mouth daily.   No Known Allergies  Past Medical History  Diagnosis Date  . Hypertension   . Hyperlipidemia   . Prediabetes   . Arthritis   . GERD (gastroesophageal reflux disease)   . BPH (benign prostatic hyperplasia)   . Vitamin D deficiency   . Gout    Health Maintenance  Topic Date Due  . ZOSTAVAX  06/08/1992  . TETANUS/TDAP  07/21/2015  . INFLUENZA VACCINE  03/21/2016  . PNA vac Low Risk Adult  Completed   Immunization History  Administered Date(s) Administered  . Influenza, High Dose Seasonal PF 06/08/2014  . Influenza-Unspecified 05/22/2015  . Pneumococcal Conjugate-13 05/25/2014  . Pneumococcal Polysaccharide-23 05/28/2013  . Td 07/20/2005  Past Surgical History  Procedure Laterality Date  . Back surgery    . Leg surgery    . Hemorrhoid surgery    . Eye surgery     Family History  Problem Relation Age of Onset  . Heart disease Mother   . Hypertension Mother   . Heart disease Father   . Cancer Neg Hx   . Diabetes Neg Hx   . Stroke Neg Hx    Social History  . Marital Status: Widowed    Spouse Name: N/A  . Number of Children: N/A  . Years of Education: N/A   Occupational History  . 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 Smoking status: Never Smoker   . Smokeless tobacco: Never Used  . Alcohol Use: No  . Drug Use: No  . Sexual Activity: Not Currently    ROS Constitutional: Denies fever, chills, weight loss/gain, headaches, insomnia,  night sweats or change in appetite. Does c/o fatigue. Eyes:  Denies redness, blurred vision, diplopia, discharge, itchy or watery eyes.  ENT: Denies discharge, congestion, post nasal drip, epistaxis, sore throat, earache, hearing loss, dental pain, Tinnitus, Vertigo, Sinus pain or snoring.  Cardio: Denies chest pain, palpitations, irregular heartbeat, syncope, dyspnea, diaphoresis, orthopnea, PND, claudication or edema Respiratory: denies cough, dyspnea, DOE, pleurisy, hoarseness, laryngitis or wheezing.  Gastrointestinal: Denies dysphagia, heartburn, reflux, water brash, pain, cramps, nausea, vomiting, bloating, diarrhea, constipation, hematemesis, melena, hematochezia, jaundice or hemorrhoids Genitourinary: Denies dysuria, frequency, urgency, nocturia, hesitancy, discharge, hematuria or flank pain Musculoskeletal: Denies arthralgia, myalgia, stiffness, Jt. Swelling, pain, limp or strain/sprain. Denies Falls. Skin: Denies puritis, rash, hives, warts, acne, eczema or change in skin lesion Neuro: No weakness, tremor, incoordination, spasms, paresthesia or pain Psychiatric: Denies confusion, memory loss or sensory loss. Denies Depression. Endocrine: Denies change in weight, skin, hair change, nocturia, and paresthesia, diabetic polys, visual blurring or hyper / hypo glycemic episodes.  Heme/Lymph: No excessive bleeding, bruising or enlarged lymph nodes.  Physical Exam  BP 128/86 mmHg  Pulse 72  Temp(Src) 97.7 F (36.5 C)  Resp 16  Ht 5\' 11"  (1.803 m)  Wt 200 lb 9.6 oz (90.992 kg)  BMI 27.99 kg/m2  General Appearance: Well nourished, in no apparent distress.  Eyes: PERRLA, EOMs, conjunctiva no swelling or erythema, normal fundi and vessels. Sinuses: No frontal/maxillary tenderness ENT/Mouth: EACs patent / TMs  nl. Nares clear without erythema, swelling, mucoid exudates. Oral hygiene is good. No erythema, swelling, or exudate. Tongue normal, non-obstructing. Tonsils not swollen or erythematous. Hearing normal.  Neck: Supple, thyroid normal. No bruits,  nodes or JVD. Respiratory: Respiratory effort normal.  BS equal and clear bilateral without rales, rhonci, wheezing or stridor. Cardio: Heart sounds are normal with regular rate and rhythm and no murmurs, rubs or gallops. Peripheral pulses are normal and equal bilaterally without edema. No aortic or femoral bruits. Chest: symmetric with normal excursions and percussion.  Abdomen: Soft, with Nl bowel sounds. Nontender, no guarding, rebound, hernias, masses, or organomegaly.  Lymphatics: Non tender without lymphadenopathy.  Genitourinary:  DRE -deferred for age. Musculoskeletal:  Generalized decrease in muscle power, tone & bulk and unable to stand/bear weight on his knees due to pain. Skin: Warm and dry without rashes, lesions, cyanosis, clubbing or  ecchymosis.  Neuro: Cranial nerves intact, reflexes equal bilaterally. No cerebellar symptoms. Sensation intact.  Pysch: Alert and oriented X 3 with normal affect, insight and judgment appropriate.   Assessment and Plan   1. Essential  hypertension  - Microalbumin / creatinine urine ratio - EKG 12-Lead - [place], RETROPERITNL ABD,  LTD - TSH  2. Hyperlipidemia  - Lipid panel - TSH  3. Prediabetes  - Hemoglobin A1c - Insulin, random  4. Vitamin D deficiency  - VITAMIN D 25 Hydroxy   5. Idiopathic gout, unspecified chronicity   6. Anticoagulation therapy not indicated   7. Gastroesophageal reflux disease   8. BPH (benign prostatic hyperplasia)  - PSA  9. Medication management  - Urinalysis, Routine w reflex microscopic - BASIC METABOLIC PANEL WITH GFR - Hepatic function panel - Magnesium  10. Screening for rectal cancer  - POC Hemoccult Bld/Stl   11. Prostate cancer screening  - PSA   Continue prudent diet as discussed, weight control, BP monitoring, regular exercise, and medications as discussed.  Discussed med effects and SE's. Will treat his chronic limiting pain with Tramadol for humane concerns. Routine  screening labs and tests as requested with regular follow-up as recommended. Over 40 minutes of exam, counseling, chart review and high complex critical decision making was performed

## 2016-02-01 LAB — BASIC METABOLIC PANEL WITH GFR
BUN: 11 mg/dL (ref 7–25)
CALCIUM: 9.2 mg/dL (ref 8.6–10.3)
CHLORIDE: 99 mmol/L (ref 98–110)
CO2: 20 mmol/L (ref 20–31)
CREATININE: 0.53 mg/dL — AB (ref 0.70–1.11)
GFR, Est African American: >89 mL/min (ref >=60)
GFR, Est Non African American: >89 mL/min (ref >=60)
Glucose, Bld: 101 mg/dL — ABNORMAL HIGH (ref 65–99)
Potassium: 4.5 mmol/L (ref 3.5–5.3)
SODIUM: 139 mmol/L (ref 135–146)

## 2016-02-01 LAB — LIPID PANEL
CHOL/HDL RATIO: 2.7 ratio (ref <=5.0)
CHOLESTEROL: 159 mg/dL (ref 125–200)
HDL: 59 mg/dL (ref >=40)
LDL Cholesterol: 72 mg/dL (ref <130)
Triglycerides: 138 mg/dL (ref <150)
VLDL: 28 mg/dL (ref <30)

## 2016-02-01 LAB — HEPATIC FUNCTION PANEL
ALBUMIN: 4.2 g/dL (ref 3.6–5.1)
ALT: 12 U/L (ref 9–46)
AST: 18 U/L (ref 10–35)
Alkaline Phosphatase: 43 U/L (ref 40–115)
BILIRUBIN DIRECT: 0.2 mg/dL (ref <=0.2)
Indirect Bilirubin: 1 mg/dL (ref 0.2–1.2)
TOTAL PROTEIN: 6.6 g/dL (ref 6.1–8.1)
Total Bilirubin: 1.2 mg/dL (ref 0.2–1.2)

## 2016-02-01 LAB — VITAMIN D 25 HYDROXY (VIT D DEFICIENCY, FRACTURES): VIT D 25 HYDROXY: 66 ng/mL (ref 30–100)

## 2016-02-01 LAB — MICROALBUMIN / CREATININE URINE RATIO
CREATININE, URINE: 49 mg/dL (ref 20–370)
MICROALB UR: 0.7 mg/dL
MICROALB/CREAT RATIO: 14 ug/mg{creat} (ref <30)

## 2016-02-01 LAB — INSULIN, RANDOM: INSULIN: 5.1 u[IU]/mL (ref 2.0–19.6)

## 2016-02-01 LAB — PSA: PSA: 2.23 ng/mL (ref <=4.00)

## 2016-02-01 LAB — MAGNESIUM: MAGNESIUM: 1.9 mg/dL (ref 1.5–2.5)

## 2016-05-04 ENCOUNTER — Ambulatory Visit: Payer: Self-pay | Admitting: Internal Medicine

## 2016-08-15 NOTE — Progress Notes (Signed)
[facility] Unk Pinto, M.D.        Uvaldo Bristle. Silverio Lay, P.A.-C       Starlyn Skeans, P.A.-C  [facility]                [address] [place], [place] [id number] Telephone [phone] Telefax [phone] ______________________________________________________________________     This very nice 82 y.o. WWM presents for 6 month follow up with Hypertension, Hyperlipidemia, Pre-Diabetes and Vitamin D Deficiency.   Patient has c/o chronic daily severe pains in his knees precluding standing and  He uses a scooter supplied the [place]. He has a diagnosis of  Peripheral neuropathy and an undefined balance disorder. Work-up including Head CT scan,  PNCV's,  EMG as well as studies for myasthenia gravis were neg  in 2011.  Patient also has hx/o Gout which has been quiescent.     Patient is treated for HTN (1983)& BP has been controlled at home. Today's BP was elevated at 184/74 and rechecked at 154/96. He reports frequent BP elevations at home.  Patient has had no complaints of any cardiac type chest pain, palpitations, dyspnea/orthopnea/PND, dizziness, claudication, or dependent edema.     Hyperlipidemia is controlled with diet (and he is Statin Intolerant).  Last Lipids were at goal: Lab Results  Component Value Date   CHOL 176 08/16/2016   HDL 71 08/16/2016   LDLCALC 79 08/16/2016   TRIG 131 08/16/2016   CHOLHDL 2.5 08/16/2016      Also, the patient has history of PreDiabetes with A1c 6.0% in 2011 and has had no symptoms of reactive hypoglycemia, diabetic polys, paresthesias or visual blurring.  Last A1c was at goal: Lab Results  Component Value Date   HGBA1C 5.6 01/31/2016      Further, the patient also has history of Vitamin D Deficiency in 2008 of "36" and supplements vitamin D without any suspected side-effects. Last vitamin D was at goal: Lab Results  Component Value Date   VD25OH 66 01/31/2016    Current Outpatient Prescriptions on File Prior to Visit  Medication Sig  . Cholecalciferol (VITAMIN D PO) Take 1 tablet by mouth daily.  Marland Kitchen diltiazem (CARDIZEM CD) 180 MG 24 hr capsule Take 1 capsule (180 mg total) by mouth daily.  Marland Kitchen doxazosin (CARDURA) 8 MG tablet Take 8 mg by mouth at bedtime.  . fish oil-omega-3 fatty acids 1000 MG capsule Take 1 g by mouth daily.  Marland Kitchen lisinopril (PRINIVIL,ZESTRIL) 40 MG tablet Take 1 tab in the morning and 1 tab in the evening or as directed.  . Magnesium 250 MG TABS Take 250 mg by mouth 2 (two) times daily.   . ondansetron (ZOFRAN) 4 MG tablet Take 1 tablet (4 mg total) by mouth every 6 (six) hours as needed.  . sertraline (ZOLOFT) 100 MG tablet Take 50 mg by mouth daily.   No current facility-administered medications on file prior to visit.    No Known Allergies PMHx:   Past Medical History:  Diagnosis Date  . Arthritis   . BPH (benign prostatic hyperplasia)   . GERD (gastroesophageal reflux disease)   . Gout   . Hyperlipidemia   . Hypertension   . Prediabetes   . Vitamin D deficiency    Immunization History  Administered Date(s) Administered  . Influenza, High Dose Seasonal PF 06/08/2014  . Influenza-Unspecified 05/22/2015  . Pneumococcal Conjugate-13 05/25/2014  .  Pneumococcal Polysaccharide-23 05/28/2013  . Td 07/20/2005   Past Surgical History:  Procedure Laterality Date  . BACK SURGERY    . EYE SURGERY    . HEMORRHOID SURGERY    . LEG SURGERY     FHx:    Reviewed / unchanged  SHx:    Reviewed / unchanged  Systems Review:  Constitutional: Denies fever, chills, wt changes, headaches, insomnia, fatigue, night sweats, change in appetite. Eyes: Denies redness, blurred vision, diplopia, discharge, itchy, watery eyes.  ENT: Denies discharge, congestion, post nasal drip, epistaxis, sore throat, earache, hearing loss, dental pain, tinnitus, vertigo, sinus pain, snoring.  CV: Denies chest pain, palpitations, irregular heartbeat,  syncope, dyspnea, diaphoresis, orthopnea, PND, claudication or edema. Respiratory: denies cough, dyspnea, DOE, pleurisy, hoarseness, laryngitis, wheezing.  Gastrointestinal: Denies dysphagia, odynophagia, heartburn, reflux, water brash, abdominal pain or cramps, nausea, vomiting, bloating, diarrhea, constipation, hematemesis, melena, hematochezia  or hemorrhoids. Genitourinary: Denies dysuria, frequency, urgency, nocturia, hesitancy, discharge, hematuria or flank pain. Musculoskeletal: Denies arthralgias, myalgias, stiffness, jt. swelling, pain, limping or strain/sprain.  Skin: Denies pruritus, rash, hives, warts, acne, eczema or change in skin lesion(s). Neuro: No weakness, tremor, incoordination, spasms, paresthesia or pain. Psychiatric: Denies confusion, memory loss or sensory loss. Endo: Denies change in weight, skin or hair change.  Heme/Lymph: No excessive bleeding, bruising or enlarged lymph nodes.  Physical Exam  BP  184/76 -> reck 154/96   P 72   T 97.7 F    R 16   Ht 5\' 11"     Wt 201 lb 9.6 oz    BMI 28.12   Appears well nourished and in no distress.  Eyes: PERRLA, EOMs, conjunctiva no swelling or erythema. Sinuses: No frontal/maxillary tenderness ENT/Mouth: EAC's clear, TM's nl w/o erythema, bulging. Nares clear w/o erythema, swelling, exudates. Oropharynx clear without erythema or exudates. Oral hygiene is good. Tongue normal, non obstructing. Hearing intact.  Neck: Supple. Thyroid nl. Car 2+/2+ without bruits, nodes or JVD. Chest: Respirations nl with BS clear & equal w/o rales, rhonchi, wheezing or stridor.  Cor: Heart sounds normal w/ regular rate and rhythm without sig. murmurs, gallops, clicks, or rubs. Peripheral pulses normal and equal  without edema.  Abdomen: Soft & bowel sounds normal. Non-tender w/o guarding, rebound, hernias, masses, or organomegaly.  Lymphatics: Unremarkable.  Musculoskeletal: Full ROM all peripheral extremities with generalized decrease in  muscle power, tone & bulk. He is semi-ambulatory & using a scooter.  Skin: Warm, dry without exposed rashes, lesions or ecchymosis apparent.  Neuro: Cranial nerves intact, reflexes equal bilaterally. Sensory-motor testing grossly intact. Tendon reflexes grossly intact.  Pysch: Alert & oriented x 3.  Insight and judgement nl & appropriate. No ideations.  Assessment and Plan:  1. Essential hypertension  - Continue medication, monitor blood pressure at home.  - Continue DASH diet. Reminder to go to the ER if any CP,  SOB, nausea, dizziness, severe HA, changes vision/speech,  left arm numbness and tingling and jaw pain.  - CBC with Differential/Platelet - BASIC METABOLIC PANEL WITH GFR - TSH  - D/C HCTZ 12.5 mg - bisoprolol-hydrochlorothiazide (ZIAC) 10-6.25 MG tablet; Take 1/2 to 1 tablet daily for BP  Dispense: 90 tablet; Refill: 1 -  Continue to monitor frequent & random BP's & ROV 6 weeks for recheck  2. Mixed hyperlipidemia  - Continue diet/meds, exercise,& lifestyle modifications.  - Continue monitor periodic cholesterol/liver & renal functions   - Hepatic function panel - Lipid panel - TSH  3. Prediabetes  - Continue diet, exercise,  lifestyle modifications.  - Monitor appropriate labs. - Hemoglobin A1c - Insulin, random  4. Vitamin D deficiency  - Continue supplementation. - VITAMIN D 25 Hydroxy  5. Idiopathic gout  - Uric acid  6. Gastroesophageal reflux disease   7. DJD   8. Medication management  - CBC with Differential/Platelet - BASIC METABOLIC PANEL WITH GFR - Hepatic function panel - Magnesium  9. Primary osteoarthritis involving multiple joints  - traMADol (ULTRAM) 50 MG tablet; Take by mouth every 6 (six) hours as needed. - predniSONE (DELTASONE) 10 MG tablet; Take 10 mg by mouth daily with breakfast. PRN        Recommended regular exercise, BP monitoring, weight control, and discussed med and SE's. Recommended labs to assess and monitor  clinical status. Further disposition pending results of labs. Over 30 minutes of exam, counseling, chart review was performed

## 2016-08-15 NOTE — Patient Instructions (Signed)

## 2016-08-16 ENCOUNTER — Ambulatory Visit (INDEPENDENT_AMBULATORY_CARE_PROVIDER_SITE_OTHER): Payer: Medicare Other | Admitting: Internal Medicine

## 2016-08-16 ENCOUNTER — Encounter: Payer: Self-pay | Admitting: Internal Medicine

## 2016-08-16 VITALS — BP 184/76 | HR 72 | Temp 97.7°F | Resp 16 | Ht 71.0 in | Wt 201.6 lb

## 2016-08-16 DIAGNOSIS — M1000 Idiopathic gout, unspecified site: Secondary | ICD-10-CM | POA: Diagnosis not present

## 2016-08-16 DIAGNOSIS — E782 Mixed hyperlipidemia: Secondary | ICD-10-CM | POA: Diagnosis not present

## 2016-08-16 DIAGNOSIS — E559 Vitamin D deficiency, unspecified: Secondary | ICD-10-CM

## 2016-08-16 DIAGNOSIS — I10 Essential (primary) hypertension: Secondary | ICD-10-CM

## 2016-08-16 DIAGNOSIS — K219 Gastro-esophageal reflux disease without esophagitis: Secondary | ICD-10-CM

## 2016-08-16 DIAGNOSIS — R7303 Prediabetes: Secondary | ICD-10-CM

## 2016-08-16 DIAGNOSIS — Z79899 Other long term (current) drug therapy: Secondary | ICD-10-CM

## 2016-08-16 DIAGNOSIS — M159 Polyosteoarthritis, unspecified: Secondary | ICD-10-CM | POA: Diagnosis not present

## 2016-08-16 DIAGNOSIS — M150 Primary generalized (osteo)arthritis: Secondary | ICD-10-CM

## 2016-08-16 LAB — LIPID PANEL
CHOL/HDL RATIO: 2.5 ratio (ref <5.0)
Cholesterol: 176 mg/dL (ref <200)
HDL: 71 mg/dL (ref >40)
LDL CALC: 79 mg/dL (ref <100)
Triglycerides: 131 mg/dL (ref <150)
VLDL: 26 mg/dL (ref <30)

## 2016-08-16 LAB — CBC WITH DIFFERENTIAL/PLATELET
BASOS ABS: 0 {cells}/uL (ref 0–200)
BASOS PCT: 0 %
EOS ABS: 59 {cells}/uL (ref 15–500)
Eosinophils Relative: 1 %
HCT: 46.5 % (ref 38.5–50.0)
Hemoglobin: 15.5 g/dL (ref 13.2–17.1)
LYMPHS PCT: 27 %
Lymphs Abs: 1593 cells/uL (ref 850–3900)
MCH: 32.6 pg (ref 27.0–33.0)
MCHC: 33.3 g/dL (ref 32.0–36.0)
MCV: 97.9 fL (ref 80.0–100.0)
MONOS PCT: 7 %
MPV: 9.7 fL (ref 7.5–12.5)
Monocytes Absolute: 413 cells/uL (ref 200–950)
NEUTROS PCT: 65 %
Neutro Abs: 3835 cells/uL (ref 1500–7800)
PLATELETS: 210 10*3/uL (ref 140–400)
RBC: 4.75 MIL/uL (ref 4.20–5.80)
RDW: 12.9 % (ref 11.0–15.0)
WBC: 5.9 10*3/uL (ref 3.8–10.8)

## 2016-08-16 LAB — BASIC METABOLIC PANEL WITH GFR
BUN: 12 mg/dL (ref 7–25)
CHLORIDE: 99 mmol/L (ref 98–110)
CO2: 29 mmol/L (ref 20–31)
CREATININE: 0.53 mg/dL — AB (ref 0.70–1.11)
Calcium: 9.6 mg/dL (ref 8.6–10.3)
GFR, Est African American: >89 mL/min (ref >=60)
GFR, Est Non African American: >89 mL/min (ref >=60)
Glucose, Bld: 82 mg/dL (ref 65–99)
POTASSIUM: 4.3 mmol/L (ref 3.5–5.3)
Sodium: 137 mmol/L (ref 135–146)

## 2016-08-16 LAB — TSH: TSH: 0.88 m[IU]/L (ref 0.40–4.50)

## 2016-08-16 LAB — HEPATIC FUNCTION PANEL
ALBUMIN: 4.3 g/dL (ref 3.6–5.1)
ALK PHOS: 44 U/L (ref 40–115)
ALT: 13 U/L (ref 9–46)
AST: 15 U/L (ref 10–35)
Bilirubin, Direct: 0.2 mg/dL (ref <=0.2)
Indirect Bilirubin: 1 mg/dL (ref 0.2–1.2)
Total Bilirubin: 1.2 mg/dL (ref 0.2–1.2)
Total Protein: 6.9 g/dL (ref 6.1–8.1)

## 2016-08-16 LAB — MAGNESIUM: MAGNESIUM: 1.9 mg/dL (ref 1.5–2.5)

## 2016-08-16 LAB — INSULIN, RANDOM: INSULIN: 6 u[IU]/mL (ref 2.0–19.6)

## 2016-08-16 LAB — URIC ACID: Uric Acid, Serum: 5.3 mg/dL (ref 4.0–8.0)

## 2016-08-16 LAB — HEMOGLOBIN A1C
HEMOGLOBIN A1C: 5.2 % (ref <5.7)
Mean Plasma Glucose: 103 mg/dL

## 2016-08-16 MED ORDER — BISOPROLOL-HYDROCHLOROTHIAZIDE 10-6.25 MG PO TABS: ORAL_TABLET | ORAL | 1 refills | Status: AC

## 2016-08-17 LAB — VITAMIN D 25 HYDROXY (VIT D DEFICIENCY, FRACTURES): VIT D 25 HYDROXY: 58 ng/mL (ref 30–100)

## 2016-08-18 ENCOUNTER — Other Ambulatory Visit: Payer: Self-pay | Admitting: Internal Medicine

## 2016-08-18 DIAGNOSIS — M159 Polyosteoarthritis, unspecified: Secondary | ICD-10-CM

## 2016-08-18 DIAGNOSIS — M150 Primary generalized (osteo)arthritis: Principal | ICD-10-CM

## 2016-08-18 MED ORDER — PREDNISONE 5 MG PO TABS: ORAL_TABLET | ORAL | 1 refills | Status: AC

## 2016-08-25 ENCOUNTER — Other Ambulatory Visit: Payer: Self-pay | Admitting: Internal Medicine

## 2016-08-25 MED ORDER — PROCHLORPERAZINE MALEATE 5 MG PO TABS: ORAL_TABLET | ORAL | 0 refills | Status: AC

## 2016-09-27 ENCOUNTER — Ambulatory Visit: Payer: Self-pay | Admitting: Internal Medicine

## 2016-10-02 ENCOUNTER — Encounter: Payer: Self-pay | Admitting: Internal Medicine

## 2016-10-02 ENCOUNTER — Ambulatory Visit (INDEPENDENT_AMBULATORY_CARE_PROVIDER_SITE_OTHER): Payer: Medicare Other | Admitting: Internal Medicine

## 2016-10-02 VITALS — BP 156/84 | HR 68 | Temp 98.4°F | Resp 16 | Ht 71.0 in

## 2016-10-02 DIAGNOSIS — Z6829 Body mass index (BMI) 29.0-29.9, adult: Secondary | ICD-10-CM | POA: Diagnosis not present

## 2016-10-02 DIAGNOSIS — M170 Bilateral primary osteoarthritis of knee: Secondary | ICD-10-CM | POA: Diagnosis not present

## 2016-10-02 DIAGNOSIS — M48062 Spinal stenosis, lumbar region with neurogenic claudication: Secondary | ICD-10-CM

## 2016-10-02 DIAGNOSIS — Z0000 Encounter for general adult medical examination without abnormal findings: Secondary | ICD-10-CM

## 2016-10-02 DIAGNOSIS — Z79899 Other long term (current) drug therapy: Secondary | ICD-10-CM

## 2016-10-02 DIAGNOSIS — Z0001 Encounter for general adult medical examination with abnormal findings: Secondary | ICD-10-CM | POA: Diagnosis not present

## 2016-10-02 DIAGNOSIS — R6889 Other general symptoms and signs: Secondary | ICD-10-CM | POA: Diagnosis not present

## 2016-10-02 DIAGNOSIS — K219 Gastro-esophageal reflux disease without esophagitis: Secondary | ICD-10-CM | POA: Diagnosis not present

## 2016-10-02 DIAGNOSIS — M1000 Idiopathic gout, unspecified site: Secondary | ICD-10-CM

## 2016-10-02 DIAGNOSIS — I10 Essential (primary) hypertension: Secondary | ICD-10-CM

## 2016-10-02 DIAGNOSIS — I4891 Unspecified atrial fibrillation: Secondary | ICD-10-CM

## 2016-10-02 DIAGNOSIS — F32A Depression, unspecified: Secondary | ICD-10-CM

## 2016-10-02 DIAGNOSIS — N401 Enlarged prostate with lower urinary tract symptoms: Secondary | ICD-10-CM

## 2016-10-02 DIAGNOSIS — F329 Major depressive disorder, single episode, unspecified: Secondary | ICD-10-CM | POA: Diagnosis not present

## 2016-10-02 DIAGNOSIS — G894 Chronic pain syndrome: Secondary | ICD-10-CM

## 2016-10-02 DIAGNOSIS — Z789 Other specified health status: Secondary | ICD-10-CM | POA: Diagnosis not present

## 2016-10-02 DIAGNOSIS — R2689 Other abnormalities of gait and mobility: Secondary | ICD-10-CM

## 2016-10-02 DIAGNOSIS — E782 Mixed hyperlipidemia: Secondary | ICD-10-CM

## 2016-10-02 DIAGNOSIS — E559 Vitamin D deficiency, unspecified: Secondary | ICD-10-CM

## 2016-10-02 DIAGNOSIS — R7303 Prediabetes: Secondary | ICD-10-CM

## 2016-10-02 DIAGNOSIS — N138 Other obstructive and reflux uropathy: Secondary | ICD-10-CM

## 2016-10-02 MED ORDER — FUROSEMIDE 40 MG PO TABS: 40.0000 mg | ORAL_TABLET | Freq: Every day | ORAL | 11 refills | Status: AC

## 2016-10-02 NOTE — Progress Notes (Signed)
MEDICARE ANNUAL WELLNESS VISIT AND FOLLOW UP Assessment:    1. Atrial fibrillation, unspecified type ([place]) -cont rate control -not indicated for anticoagulation -followed by cardiology  2. Essential hypertension -slightly elevated on first check, recheck normal -cont meds -dash diet -monitor at home -call if greater than 150/90  3. Gastroesophageal reflux disease, esophagitis presence not specified -cont medication -avoid trigger foods  4. Osteoarthritis of both knees, unspecified osteoarthritis type -tylenol or pain medication prn  5. BPH with obstruction/lower urinary tract symptoms -cont medications -followed by urology  6. Anticoagulation therapy not indicated -fall risk outweights benefits  7. BMI 29.0-29.9,adult -weight loss not indicated as patient elderly  8. Chronic pain syndrome -try to keep dosage of opiates to minimum due to fall risk  9. Depression poorly controlled -screen positive -discussed possibly adding wellbutrin which patient declined  10. Idiopathic gout, unspecified chronicity, unspecified site -cont allopurinol  11. Mixed hyperlipidemia -cont medication -diet and exercise  12. Medication management -cont labs biyearly -recently had them 6 weeks ago  24. Morbid obesity ([place]) -now out of morbid obesity  14. Multifactorial gait disorder -uses scooter -recommended PT which patient declined -use walker if walking at home  15. Prediabetes -cont diet and exercise -last A1c normal  16. Spinal stenosis, lumbar region, with neurogenic claudication -severe -not candidate for surgery  17. Vitamin D deficiency -cont VIt D    Over 30 minutes of exam, counseling, chart review, and critical decision making was performed  Future Appointments Date Time Provider [place]  10/02/2016 2:30 PM Starlyn Skeans, PA-C [facility] None  11/14/2016 11:00 AM Vicie Mutters, PA-C [facility] None  03/05/2017 10:00 AM Unk Pinto, MD  [facility] None     Plan:   During the course of the visit the patient was educated and counseled about appropriate screening and preventive services including:    Pneumococcal vaccine   Influenza vaccine  Prevnar 13  Td vaccine  Screening electrocardiogram  Colorectal cancer screening  Diabetes screening  Glaucoma screening  Nutrition counseling    Subjective:  Tracy Brandt is a 83 y.o. male who presents for Medicare Annual Wellness Visit and 3 month follow up for HTN, hyperlipidemia, prediabetes, and vitamin D Def.   His blood pressure has been controlled at home, today their BP is BP: (!) 156/84 He does workout. He denies chest pain, shortness of breath, dizziness.  He is on cholesterol medication and denies myalgias. His cholesterol is at goal. The cholesterol last visit was:  Lab Results  Component Value Date   CHOL 176 08/16/2016   HDL 71 08/16/2016   LDLCALC 79 08/16/2016   TRIG 131 08/16/2016   CHOLHDL 2.5 08/16/2016   He has historically well controlled A1c levels.  He has no diabetic symptoms of polyuria, polydipsia or polyphagia.   : Lab Results  Component Value Date   HGBA1C 5.2 08/16/2016   Last GFR Lab Results  Component Value Date   [facility] >89 08/16/2016     Lab Results  Component Value Date   GFRAA >89 08/16/2016   Patient is on Vitamin D supplement.   Lab Results  Component Value Date   VD25OH 37 08/16/2016      He reports that he has some swelling in his right ankle and it is tender when he gets up on it.  He is having some trouble with getting up and down on the bed.  He reports that he is taking stuff for this arthritis but he is struggling with it.  Medication Review: Current Outpatient Prescriptions on File Prior to Visit  Medication Sig Dispense Refill  . bisoprolol-hydrochlorothiazide (ZIAC) 10-6.25 MG tablet Take 1/2 to 1 tablet daily for BP 90 tablet 1  . Cholecalciferol (VITAMIN D PO) Take 1 tablet by mouth  daily.    Marland Kitchen diltiazem (CARDIZEM CD) 180 MG 24 hr capsule Take 1 capsule (180 mg total) by mouth daily. 30 capsule 11  . doxazosin (CARDURA) 8 MG tablet Take 8 mg by mouth at bedtime.    . fish oil-omega-3 fatty acids 1000 MG capsule Take 1 g by mouth daily.    Marland Kitchen lisinopril (PRINIVIL,ZESTRIL) 40 MG tablet Take 1 tab in the morning and 1 tab in the evening or as directed. 180 tablet 1  . Magnesium 250 MG TABS Take 250 mg by mouth 2 (two) times daily.     . ondansetron (ZOFRAN) 4 MG tablet Take 1 tablet (4 mg total) by mouth every 6 (six) hours as needed. 20 tablet 0  . predniSONE (DELTASONE) 5 MG tablet Take 1/2 to 1 tablet  1 to 2 x / day as directed 180 tablet 1  . prochlorperazine (COMPAZINE) 5 MG tablet Take 1 tablet 3 to 4 x/day if needed for Nausea /Vomitting 30 tablet 0  . sertraline (ZOLOFT) 100 MG tablet Take 50 mg by mouth daily.    . traMADol (ULTRAM) 50 MG tablet Take by mouth every 6 (six) hours as needed.     No current facility-administered medications on file prior to visit.     Allergies: No Known Allergies  Current Problems (verified) has Hypertension; Hyperlipidemia; Prediabetes; Vitamin D deficiency; Gout; Morbid obesity ([place]); Spinal stenosis, lumbar region, with neurogenic claudication; Depression, controlled; Medication management; BMI 29.0-29.9,adult; Atrial fibrillation ([place]); Anticoagulation therapy not indicated; Multifactorial gait disorder; GERD (gastroesophageal reflux disease); BPH with obstruction/lower urinary tract symptoms; DJD (degenerative joint disease) of knee; and Chronic pain syndrome on his problem list.  Screening Tests Immunization History  Administered Date(s) Administered  . Influenza, High Dose Seasonal PF 06/08/2014  . Influenza-Unspecified 05/22/2015  . Pneumococcal Conjugate-13 05/25/2014  . Pneumococcal Polysaccharide-23 05/28/2013  . Td 07/20/2005    Preventative care: Last colonoscopy: remote, age precludes him from further  Prior  vaccinations: TD or Tdap: 2006  Influenza: 2016  Pneumococcal: 2015 Prevnar13: 2016  Names of Other Physician/Practitioners you currently use: 1. [facility] here for primary care 2. Does not see, eye doctor, last visit  3. Does not see, dentures, dentist, last visit  Patient Care Team: Unk Pinto, MD as PCP - General (Internal Medicine)  Surgical: He  has a past surgical history that includes Back surgery; Leg Surgery; Hemorrhoid surgery; and Eye surgery. Family His family history includes Heart disease in his father and mother; Hypertension in his mother. Social history  He reports that he has never smoked. He has never used smokeless tobacco. He reports that he does not drink alcohol or use drugs.  MEDICARE WELLNESS OBJECTIVES: Physical activity:   Cardiac risk factors:   Depression/mood screen:   Depression screen [facility] 2/9 08/16/2016  Decreased Interest 0  Down, Depressed, Hopeless 0  PHQ - 2 Score 0    ADLs:  In your present state of health, do you have any difficulty performing the following activities: 08/16/2016 01/31/2016  Hearing? N N  Vision? N N  Difficulty concentrating or making decisions? N N  Walking or climbing stairs? N Y  Dressing or bathing? N Y  Doing errands, shopping? Aggie Moats  Some recent data might be hidden     Cognitive Testing  Alert? Yes  Normal Appearance?Yes  Oriented to person? Yes  Place? Yes   Time? Yes  Recall of three objects?  Yes  Can perform simple calculations? Yes  Displays appropriate judgment?Yes  Can read the correct time from a watch face?Yes  EOL planning:     Objective:   Today's Vitals   10/02/16 1403  BP: (!) 156/84  Pulse: 68  Resp: 16  Temp: 98.4 F (36.9 C)  TempSrc: Temporal  Height: 5\' 11"  (1.803 m)   There is no height or weight on file to calculate BMI.  General appearance: alert, no distress, WD/WN, male HEENT: normocephalic, sclerae anicteric, TMs pearly,  nares patent, no discharge or erythema, pharynx normal Oral cavity: MMM, no lesions Neck: supple, no lymphadenopathy, no thyromegaly, no masses Heart: RRR, normal S1, S2, no murmurs Lungs: CTA bilaterally, no wheezes, rhonchi, or rales Abdomen: +bs, soft, non tender, non distended, no masses, no hepatomegaly, no splenomegaly Musculoskeletal: nontender, no swelling, no obvious deformity Extremities: no edema, no cyanosis, no clubbing Pulses: 2+ symmetric, upper and lower extremities, normal cap refill Neurological: alert, oriented x 3, CN2-12 intact, strength normal upper extremities and lower extremities, sensation normal throughout, DTRs 2+ throughout, no cerebellar signs, gait normal Psychiatric: normal affect, behavior normal, pleasant   Medicare Attestation I have personally reviewed: The patient's medical and social history Their use of alcohol, tobacco or illicit drugs Their current medications and supplements The patient's functional ability including ADLs,fall risks, home safety risks, cognitive, and hearing and visual impairment Diet and physical activities Evidence for depression or mood disorders  The patient's weight, height, BMI, and visual acuity have been recorded in the chart.  I have made referrals, counseling, and provided education to the patient based on review of the above and I have provided the patient with a written personalized care plan for preventive services.     Starlyn Skeans, PA-C   10/02/2016

## 2016-10-02 NOTE — Patient Instructions (Signed)
Please elevate your feet above your heart as often as possible.  Please wear compression socks daily when you are up for the day.  When you go to bed you can remove them.  Please try to avoid salt intake.  Brands like Tommy Copper and Dr. Karie Georges are available over the counter and are very helpful.  If this does not work in the next day or two please take 1 tablet of lasix in the morning.  Be close to a restroom as you will urinate a lot.    Try using miralax once daily mixed with any liquid or semi liquid and this will help you have better bowel movements.  Stop the colace.

## 2016-10-24 ENCOUNTER — Ambulatory Visit (INDEPENDENT_AMBULATORY_CARE_PROVIDER_SITE_OTHER): Payer: Medicare Other | Admitting: Cardiology

## 2016-10-24 ENCOUNTER — Encounter: Payer: Self-pay | Admitting: Cardiology

## 2016-10-24 DIAGNOSIS — I481 Persistent atrial fibrillation: Secondary | ICD-10-CM | POA: Diagnosis not present

## 2016-10-24 DIAGNOSIS — Z789 Other specified health status: Secondary | ICD-10-CM | POA: Diagnosis not present

## 2016-10-24 DIAGNOSIS — I10 Essential (primary) hypertension: Secondary | ICD-10-CM

## 2016-10-24 DIAGNOSIS — M48062 Spinal stenosis, lumbar region with neurogenic claudication: Secondary | ICD-10-CM

## 2016-10-24 DIAGNOSIS — I4819 Other persistent atrial fibrillation: Secondary | ICD-10-CM

## 2016-10-24 NOTE — Patient Instructions (Signed)
Medication Instructions:  Your physician recommends that you continue on your current medications as directed. Please refer to the Current Medication list given to you today.  Follow-Up: Your physician wants you to follow-up in: 6 MONTHS with DR. [place]. You will receive a reminder letter in the mail two months in advance. If you don't receive a letter, please call our office to schedule the follow-up appointment.   Any Other Special Instructions Will Be Listed Below (If Applicable).     If you need a refill on your cardiac medications before your next appointment, please call your pharmacy.

## 2016-10-24 NOTE — Progress Notes (Signed)
10/24/2016 Tracy Brandt   02/22/1930  ER:[id number]  Primary Physician Tracy DAVID, MD Primary Cardiologist: Tracy Brandt  HPI:  83 y/o male followed at the [facility] and by Tracy Brandt. He is a [place] [employer] veteran. He has a history of AF diagnosed in 2016. He was not felt to be a candidate for anticoagulation secondary to high risk of falls (CHADs VASc=3). He could not tolerate Lopressor secondary to fatigue. He is tolerating Ziac and diltiazem. Echo in Aug 2016 showed preserved LVF, LA 4.0.   He is in the office today for follow up. He is still being followed at the [place] in [place] and by Tracy Tracy Brandt.  His medications were recently adjusted for Rt  LE edema. He says he had  a doppler that was negative for clot. His edema is improving. He denies any increased DOE but his activity is limited secondary to arthritis. He is trying to get his care transferred to [facility].    Current Outpatient Prescriptions  Medication Sig Dispense Refill  . bisoprolol-hydrochlorothiazide (ZIAC) 10-6.25 MG tablet Take 1/2 to 1 tablet daily for BP 90 tablet 1  . Cholecalciferol (VITAMIN D PO) Take 1 tablet by mouth daily.    Marland Kitchen diltiazem (CARDIZEM CD) 180 MG 24 hr capsule Take 1 capsule (180 mg total) by mouth daily. 30 capsule 11  . doxazosin (CARDURA) 8 MG tablet Take 8 mg by mouth at bedtime.    . fish oil-omega-3 fatty acids 1000 MG capsule Take 1 g by mouth daily.    . furosemide (LASIX) 40 MG tablet Take 1 tablet (40 mg total) by mouth daily. 30 tablet 11  . lisinopril (PRINIVIL,ZESTRIL) 40 MG tablet Take 1 tab in the morning and 1 tab in the evening or as directed. 180 tablet 1  . Magnesium 250 MG TABS Take 250 mg by mouth 2 (two) times daily.     . ondansetron (ZOFRAN) 4 MG tablet Take 1 tablet (4 mg total) by mouth every 6 (six) hours as needed. 20 tablet 0  . predniSONE (DELTASONE) 5 MG tablet Take 1/2 to 1 tablet  1 to 2 x / day as directed 180 tablet 1  . prochlorperazine (COMPAZINE) 5 MG  tablet Take 1 tablet 3 to 4 x/day if needed for Nausea /Vomitting 30 tablet 0  . sertraline (ZOLOFT) 100 MG tablet Take 50 mg by mouth daily.    . traMADol (ULTRAM) 50 MG tablet Take by mouth every 6 (six) hours as needed.     No current facility-administered medications for this visit.     No Known Allergies  Past Medical History:  Diagnosis Date  . Arthritis   . BPH (benign prostatic hyperplasia)   . GERD (gastroesophageal reflux disease)   . Gout   . Hyperlipidemia   . Hypertension   . Prediabetes   . Vitamin D deficiency     Social History   Social History  . Marital status: Widowed    Spouse name: N/A  . Number of children: N/A  . Years of education: N/A   Occupational History  . Not on file.   Social History Main Topics  . Smoking status: Never Smoker  . Smokeless tobacco: Never Used  . Alcohol use No  . Drug use: No  . Sexual activity: Not Currently   Other Topics Concern  . Not on file   Social History Narrative  . No narrative on file     Family History  Problem Relation Age  of Onset  . Heart disease Mother   . Hypertension Mother   . Heart disease Father   . Cancer Neg Hx   . Diabetes Neg Hx   . Stroke Neg Hx      Review of Systems: General: negative for chills, fever, night sweats or weight changes.  Cardiovascular: negative for chest pain, dyspnea on exertion, edema, orthopnea, palpitations, paroxysmal nocturnal dyspnea or shortness of breath Dermatological: negative for rash Respiratory: negative for cough or wheezing Urologic: negative for hematuria Abdominal: negative for nausea, vomiting, diarrhea, bright red blood per rectum, melena, or hematemesis Neurologic: negative for visual changes, syncope, or dizziness All other systems reviewed and are otherwise negative except as noted above.    Blood pressure (!) 158/91, pulse 88, weight 200 lb (90.7 kg).  General appearance: alert, cooperative, no distress, moderately obese and in  electric wheelchair Neck: no carotid bruit and no JVD Lungs: clear to auscultation bilaterally Heart: irregularly irregular rhythm Extremities: 1+ Rt LE edema, brace on LLE Skin: Skin color, texture, turgor normal. No rashes or lesions Neurologic: Grossly normal  EKG AF with CVR-88  ASSESSMENT AND PLAN:   Atrial fibrillation [facility]) Diagnosed June 2016. 2D normal LVF, LA 4.0. Pt couldn't tolerate Lopressor secondary to fatigue.  He is stable in Bisoprolol and Diltiazem  Essential hypertension Normal LVF, mild LVH on echo  Anticoagulation therapy not indicated Due to significant fall risk  Dyslipidemia LDL 79 Dec 2017  Spinal stenosis, lumbar region, with neurogenic claudication Wheelchair bound   PLAN  I did not change Tracy Brandt's medications. He is stable from a cardiac standpoint, tolerating CAF. We'll see him back in 6 months. I requested his doppler and records from the [place].   Tracy Ransom PA-C 10/24/2016 10:15 AM

## 2016-10-24 NOTE — Assessment & Plan Note (Signed)
Diagnosed June 2016. 2D normal LVF, LA 4.0. Pt couldn't tolerate Lopressor secondary to fatigue.  He is stable in Bisoprolol and Diltiazem

## 2016-10-24 NOTE — Assessment & Plan Note (Signed)
Due to significant fall risk

## 2016-10-24 NOTE — Assessment & Plan Note (Signed)
Wheelchair bound 

## 2016-10-24 NOTE — Assessment & Plan Note (Signed)
Normal LVF, mild LVH on echo

## 2016-10-24 NOTE — Assessment & Plan Note (Signed)
LDL 79 Dec 2017

## 2016-10-25 ENCOUNTER — Telehealth: Payer: Self-pay | Admitting: Cardiovascular Disease

## 2016-10-25 NOTE — Telephone Encounter (Signed)
Faxed Release signed by patient to [facility] to obtain records per [employer], [place].  Records given to [facility] to review. lp

## 2016-11-14 ENCOUNTER — Ambulatory Visit: Payer: Self-pay | Admitting: Physician Assistant

## 2016-11-24 ENCOUNTER — Ambulatory Visit: Payer: Self-pay | Admitting: Physician Assistant

## 2016-12-27 ENCOUNTER — Other Ambulatory Visit: Payer: Self-pay | Admitting: Internal Medicine

## 2016-12-27 MED ORDER — LISINOPRIL 40 MG PO TABS: ORAL_TABLET | ORAL | 1 refills | Status: DC

## 2017-03-05 ENCOUNTER — Ambulatory Visit (INDEPENDENT_AMBULATORY_CARE_PROVIDER_SITE_OTHER): Payer: Medicare Other | Admitting: Internal Medicine

## 2017-03-05 ENCOUNTER — Encounter: Payer: Self-pay | Admitting: Internal Medicine

## 2017-03-05 VITALS — BP 138/78 | HR 75 | Temp 97.9°F | Resp 16 | Wt 190.0 lb

## 2017-03-05 DIAGNOSIS — K219 Gastro-esophageal reflux disease without esophagitis: Secondary | ICD-10-CM

## 2017-03-05 DIAGNOSIS — M1000 Idiopathic gout, unspecified site: Secondary | ICD-10-CM | POA: Diagnosis not present

## 2017-03-05 DIAGNOSIS — R7303 Prediabetes: Secondary | ICD-10-CM

## 2017-03-05 DIAGNOSIS — E559 Vitamin D deficiency, unspecified: Secondary | ICD-10-CM

## 2017-03-05 DIAGNOSIS — Z136 Encounter for screening for cardiovascular disorders: Secondary | ICD-10-CM | POA: Diagnosis not present

## 2017-03-05 DIAGNOSIS — Z79899 Other long term (current) drug therapy: Secondary | ICD-10-CM | POA: Diagnosis not present

## 2017-03-05 DIAGNOSIS — I482 Chronic atrial fibrillation, unspecified: Secondary | ICD-10-CM

## 2017-03-05 DIAGNOSIS — E782 Mixed hyperlipidemia: Secondary | ICD-10-CM | POA: Diagnosis not present

## 2017-03-05 DIAGNOSIS — Z125 Encounter for screening for malignant neoplasm of prostate: Secondary | ICD-10-CM | POA: Diagnosis not present

## 2017-03-05 DIAGNOSIS — I4891 Unspecified atrial fibrillation: Secondary | ICD-10-CM

## 2017-03-05 DIAGNOSIS — I10 Essential (primary) hypertension: Secondary | ICD-10-CM

## 2017-03-05 DIAGNOSIS — Z1212 Encounter for screening for malignant neoplasm of rectum: Secondary | ICD-10-CM

## 2017-03-05 DIAGNOSIS — R2689 Other abnormalities of gait and mobility: Secondary | ICD-10-CM

## 2017-03-05 LAB — TSH: TSH: 0.62 mIU/L (ref 0.40–4.50)

## 2017-03-05 LAB — CBC WITH DIFFERENTIAL/PLATELET
BASOS PCT: 1 %
Basophils Absolute: 64 cells/uL (ref 0–200)
EOS PCT: 1 %
Eosinophils Absolute: 64 cells/uL (ref 15–500)
HCT: 45.5 % (ref 38.5–50.0)
Hemoglobin: 15.2 g/dL (ref 13.2–17.1)
LYMPHS PCT: 22 %
Lymphs Abs: 1408 cells/uL (ref 850–3900)
MCH: 32.7 pg (ref 27.0–33.0)
MCHC: 33.4 g/dL (ref 32.0–36.0)
MCV: 97.8 fL (ref 80.0–100.0)
MONOS PCT: 6 %
MPV: 9.9 fL (ref 7.5–12.5)
Monocytes Absolute: 384 cells/uL (ref 200–950)
Neutro Abs: 4480 cells/uL (ref 1500–7800)
Neutrophils Relative %: 70 %
PLATELETS: 210 10*3/uL (ref 140–400)
RBC: 4.65 MIL/uL (ref 4.20–5.80)
RDW: 13.3 % (ref 11.0–15.0)
WBC: 6.4 10*3/uL (ref 3.8–10.8)

## 2017-03-05 LAB — HEPATIC FUNCTION PANEL
ALBUMIN: 4.1 g/dL (ref 3.6–5.1)
ALK PHOS: 47 U/L (ref 40–115)
ALT: 11 U/L (ref 9–46)
AST: 17 U/L (ref 10–35)
BILIRUBIN TOTAL: 1.1 mg/dL (ref 0.2–1.2)
Bilirubin, Direct: 0.2 mg/dL (ref <=0.2)
Indirect Bilirubin: 0.9 mg/dL (ref 0.2–1.2)
Total Protein: 6.8 g/dL (ref 6.1–8.1)

## 2017-03-05 LAB — BASIC METABOLIC PANEL WITH GFR
BUN: 11 mg/dL (ref 7–25)
CO2: 26 mmol/L (ref 20–31)
Calcium: 9.3 mg/dL (ref 8.6–10.3)
Chloride: 96 mmol/L — ABNORMAL LOW (ref 98–110)
Creat: 0.56 mg/dL — ABNORMAL LOW (ref 0.70–1.11)
Glucose, Bld: 112 mg/dL — ABNORMAL HIGH (ref 65–99)
POTASSIUM: 4.1 mmol/L (ref 3.5–5.3)
SODIUM: 133 mmol/L — AB (ref 135–146)

## 2017-03-05 LAB — LIPID PANEL
Cholesterol: 173 mg/dL (ref <200)
HDL: 68 mg/dL (ref >40)
LDL CALC: 85 mg/dL (ref <100)
TRIGLYCERIDES: 102 mg/dL (ref <150)
Total CHOL/HDL Ratio: 2.5 Ratio (ref <5.0)
VLDL: 20 mg/dL (ref <30)

## 2017-03-05 NOTE — Patient Instructions (Signed)

## 2017-03-05 NOTE — Progress Notes (Signed)
[place] [facility]   Unk Pinto, M.D.      Uvaldo Bristle. Silverio Lay, P.A.-C [facility]                [address] [place], [place] [address] Telephone [phone] Telefax [phone]  Comprehensive Evaluation & Examination     This very nice 83 y.o. WWM presents for a  comprehensive evaluation and management of multiple medical co-morbidities.  Patient has been followed for HTN, Prediabetes, Hyperlipidemia and Vitamin D Deficiency. Patient has hx/o Gout apparently controlled on his meds.      Patient is on a scooter from the [place] for a multifactorial unstable gait attributed to bilat knee pains and a Peripheral Neuropathy with an undefined Balance Disorder with negative CT Head scan, PNCV's, EMG and negative serology for Myasthena Gravis.      HTN predates since 55. Patient's BP has been controlled at home.  Today's BP is at goal - 138/78.  Patient has Chronic Afib with treatment deferred (except bASA) due unstable gait /high fall risk. Patient denies any cardiac symptoms as chest pain, palpitations, shortness of breath, dizziness or ankle swelling.     Patient's hyperlipidemia is controlled with diet and medications. Patient denies myalgias or other medication SE's. Last lipids were at goal:  Lab Results  Component Value Date   CHOL 176 08/16/2016   HDL 71 08/16/2016   LDLCALC 79 08/16/2016   TRIG 131 08/16/2016   CHOLHDL 2.5 08/16/2016      Patient has prediabetes (A1c 6.0% in 2011) and patient denies reactive hypoglycemic symptoms, visual blurring, diabetic polys or paresthesias. Last A1c was at goal: Lab Results  Component Value Date   HGBA1C 5.2 08/16/2016       Finally, patient has history of Vitamin D Deficiency ("36" in 2008) and last vitamin D was at goal: Lab Results  Component Value Date   VD25OH 58 08/16/2016   Current Outpatient Prescriptions on File Prior to Visit  Medication Sig  .  bisoprolol-hydrochlorothiazide (ZIAC) 10-6.25 MG tablet Take 1/2 to 1 tablet daily for BP  . Cholecalciferol (VITAMIN D PO) Take 1 tablet by mouth daily.  Marland Kitchen diltiazem (CARDIZEM CD) 180 MG 24 hr capsule Take 1 capsule (180 mg total) by mouth daily.  Marland Kitchen doxazosin (CARDURA) 8 MG tablet Take 8 mg by mouth at bedtime.  . fish oil-omega-3 fatty acids 1000 MG capsule Take 1 g by mouth daily.  . furosemide (LASIX) 40 MG tablet Take 1 tablet (40 mg total) by mouth daily.  Marland Kitchen lisinopril (PRINIVIL,ZESTRIL) 40 MG tablet TAKE 1 TAB IN THE MORNING AND 1 TAB IN THE EVENING OR AS DIRECTED.  . Magnesium 250 MG TABS Take 250 mg by mouth 2 (two) times daily.   . ondansetron (ZOFRAN) 4 MG tablet Take 1 tablet (4 mg total) by mouth every 6 (six) hours as needed.  . prochlorperazine (COMPAZINE) 5 MG tablet Take 1 tablet 3 to 4 x/day if needed for Nausea /Vomitting  . sertraline (ZOLOFT) 100 MG tablet Take 50 mg by mouth daily.  . traMADol (ULTRAM) 50 MG tablet Take by mouth every 6 (six) hours as needed.   No current facility-administered medications on file prior to visit.    No Known Allergies Past Medical History:  Diagnosis Date  . Arthritis   . BPH (benign prostatic hyperplasia)   . GERD (gastroesophageal reflux disease)   .  Gout   . Hyperlipidemia   . Hypertension   . Prediabetes   . Vitamin D deficiency    Health Maintenance  Topic Date Due  . TETANUS/TDAP  07/21/2015  . INFLUENZA VACCINE  03/21/2017  . PNA vac Low Risk Adult  Completed   Immunization History  Administered Date(s) Administered  . Influenza, High Dose Seasonal PF 06/08/2014  . Influenza-Unspecified 05/22/2015  . Pneumococcal Conjugate-13 05/25/2014  . Pneumococcal Polysaccharide-23 05/28/2013  . Td 07/20/2005   Past Surgical History:  Procedure Laterality Date  . BACK SURGERY    . EYE SURGERY    . HEMORRHOID SURGERY    . LEG SURGERY     Family History  Problem Relation Age of Onset  . Heart disease Mother   .  Hypertension Mother   . Heart disease Father   . Cancer Neg Hx   . Diabetes Neg Hx   . Stroke Neg Hx    Social History   Social History  . Marital status: Widowed    Spouse name: N/A  . Number of children: N/A  . Years of education: N/A   Occupational History  . retired   Social History Main Topics  . Smoking status: Never Smoker  . Smokeless tobacco: Never Used  . Alcohol use No  . Drug use: No  . Sexual activity: Not Currently    ROS Constitutional: Denies fever, chills, weight loss/gain, headaches, insomnia,  night sweats or change in appetite. Does c/o fatigue. Eyes: Denies redness, blurred vision, diplopia, discharge, itchy or watery eyes.  ENT: Denies discharge, congestion, post nasal drip, epistaxis, sore throat, earache, hearing loss, dental pain, Tinnitus, Vertigo, Sinus pain or snoring.  Cardio: Denies chest pain, palpitations, irregular heartbeat, syncope, dyspnea, diaphoresis, orthopnea, PND, claudication or edema Respiratory: denies cough, dyspnea, DOE, pleurisy, hoarseness, laryngitis or wheezing.  Gastrointestinal: Denies dysphagia, heartburn, reflux, water brash, pain, cramps, nausea, vomiting, bloating, diarrhea, constipation, hematemesis, melena, hematochezia, jaundice or hemorrhoids Genitourinary: Denies dysuria, frequency, urgency, nocturia, hesitancy, discharge, hematuria or flank pain Musculoskeletal: Denies arthralgia, myalgia, stiffness, Jt. Swelling, pain, limp or strain/sprain. Denies Falls. Skin: Denies puritis, rash, hives, warts, acne, eczema or change in skin lesion Neuro: No weakness, tremor, incoordination, spasms, paresthesia or pain Psychiatric: Denies confusion, memory loss or sensory loss. Denies Depression. Endocrine: Denies change in weight, skin, hair change, nocturia, and paresthesia, diabetic polys, visual blurring or hyper / hypo glycemic episodes.  Heme/Lymph: No excessive bleeding, bruising or enlarged lymph nodes.  Physical  Exam  BP 138/78   Pulse 75   Temp 97.9 F (36.6 C)   Resp 16   Wt 190 lb (86.2 kg)   BMI 26.50 kg/m   General Appearance: Well nourished and well groomed and in no apparent distress.  Eyes: PERRLA, EOMs, conjunctiva no swelling or erythema, normal fundi and vessels. Sinuses: No frontal/maxillary tenderness ENT/Mouth: EACs patent / TMs  nl. Nares clear without erythema, swelling, mucoid exudates. Oral hygiene is good. No erythema, swelling, or exudate. Tongue normal, non-obstructing. Tonsils not swollen or erythematous. Hearing normal.  Neck: Supple, thyroid normal. No bruits, nodes or JVD. Respiratory: Respiratory effort normal.  BS equal and clear bilateral without rales, rhonci, wheezing or stridor. Cardio: Heart sounds are normal with regular rate and rhythm and no murmurs, rubs or gallops. Peripheral pulses are normal and equal bilaterally without edema. No aortic or femoral bruits. Chest: symmetric with normal excursions and percussion.  Abdomen: Soft, with Nl bowel sounds. Nontender, no guarding, rebound, hernias, masses, or organomegaly.  Lymphatics: Non tender without lymphadenopathy.  Genitourinary: No hernias.Testes nl. DRE - deferred for age.  Musculoskeletal: Generalized decrease in muscle power, tone and bulk. In an Transport planner.  Skin: Warm and dry without rashes, lesions, cyanosis, clubbing or  ecchymosis.  Neuro: Cranial nerves intact, reflexes equal flat thru-out. (+) Romberg with  Poor knee -> shin. Sensation intact.  Pysch: Alert and oriented X 3 with normal affect, insight and judgment concrete.   Assessment and Plan  1. Essential hypertension  - EKG 12-Lead - [place], RETROPERITNL ABD,  LTD - Urinalysis, Routine w reflex microscopic - Microalbumin / creatinine urine ratio - CBC with Differential/Platelet - BASIC METABOLIC PANEL WITH GFR - Magnesium - TSH  2. Hyperlipidemia, mixed  - EKG 12-Lead - [place], RETROPERITNL ABD,  LTD - Hepatic function panel -  Lipid panel - TSH  3. Prediabetes  - EKG 12-Lead - [place], RETROPERITNL ABD,  LTD - Hemoglobin A1c - Insulin, random  4. Vitamin D deficiency  - VITAMIN D 25 Hydroxy (Vit-D Deficiency, Fractures)  5. Idiopathic gout, unspecified chronicity, unspecified site  - Uric acid  6. Atrial fibrillation, unspecified type ([place])   7. Gastroesophageal reflux disease, esophagitis presence not specified   8. Multifactorial gait disorder   9. Prostate cancer screening  - PSA  10. Screening for rectal cancer  - POC Hemoccult Bld/Stl   11. Screening for ischemic heart disease  - EKG 12-Lead  12. Screening for AAA (aortic abdominal aneurysm)  - [place], RETROPERITNL ABD,  LTD  13. Medication management  - Urinalysis, Routine w reflex microscopic - Microalbumin / creatinine urine ratio - CBC with Differential/Platelet - BASIC METABOLIC PANEL WITH GFR - Hepatic function panel - Magnesium - Lipid panel - TSH - Hemoglobin A1c - Insulin, random - VITAMIN D 25 Hydroxy (Vit-D Deficiency, Fractures) - Uric acid  14. Chronic atrial fibrillation (HCC)  -Coags deferred for high fall risk.       Patient was counseled in prudent diet, weight control to achieve/maintain BMI less than 25, BP monitoring, regular exercise and medications as discussed.  Discussed med effects and SE's. Routine screening labs and tests as requested with regular follow-up as recommended. Over 40 minutes of exam, counseling, chart review and high complex critical decision making was performed

## 2017-03-06 LAB — URINALYSIS, ROUTINE W REFLEX MICROSCOPIC
BILIRUBIN URINE: NEGATIVE
Glucose, UA: NEGATIVE
Ketones, ur: NEGATIVE
Leukocytes, UA: NEGATIVE
NITRITE: NEGATIVE
Protein, ur: NEGATIVE
SPECIFIC GRAVITY, URINE: 1.018 (ref 1.001–1.035)
pH: 7 (ref 5.0–8.0)

## 2017-03-06 LAB — URINALYSIS, MICROSCOPIC ONLY
Bacteria, UA: NONE SEEN [HPF]
CASTS: NONE SEEN [LPF]
CRYSTALS: NONE SEEN [HPF]
RBC / HPF: NONE SEEN RBC/HPF (ref <=2)
SQUAMOUS EPITHELIAL / LPF: NONE SEEN [HPF] (ref <=5)
WBC, UA: NONE SEEN WBC/HPF (ref <=5)
Yeast: NONE SEEN [HPF]

## 2017-03-06 LAB — INSULIN, RANDOM: INSULIN: 12.2 u[IU]/mL (ref 2.0–19.6)

## 2017-03-06 LAB — URIC ACID: Uric Acid, Serum: 5.8 mg/dL (ref 4.0–8.0)

## 2017-03-06 LAB — MAGNESIUM: MAGNESIUM: 1.9 mg/dL (ref 1.5–2.5)

## 2017-03-06 LAB — MICROALBUMIN / CREATININE URINE RATIO
Creatinine, Urine: 90 mg/dL (ref 20–370)
MICROALB UR: 1.6 mg/dL
MICROALB/CREAT RATIO: 18 ug/mg{creat} (ref <30)

## 2017-03-06 LAB — HEMOGLOBIN A1C
Hgb A1c MFr Bld: 5.5 % (ref <5.7)
Mean Plasma Glucose: 111 mg/dL

## 2017-03-06 LAB — VITAMIN D 25 HYDROXY (VIT D DEFICIENCY, FRACTURES): VIT D 25 HYDROXY: 48 ng/mL (ref 30–100)

## 2017-03-06 LAB — PSA: PSA: 2.3 ng/mL (ref <=4.0)

## 2017-03-06 NOTE — Progress Notes (Signed)
Pt aware of lab results & voiced understanding of those results.

## 2017-06-22 ENCOUNTER — Ambulatory Visit: Payer: Self-pay | Admitting: Physician Assistant

## 2017-09-26 ENCOUNTER — Ambulatory Visit (INDEPENDENT_AMBULATORY_CARE_PROVIDER_SITE_OTHER): Payer: Medicare Other | Admitting: Internal Medicine

## 2017-09-26 ENCOUNTER — Encounter: Payer: Self-pay | Admitting: Internal Medicine

## 2017-09-26 VITALS — BP 162/78 | HR 76 | Temp 97.7°F | Resp 16

## 2017-09-26 DIAGNOSIS — I4891 Unspecified atrial fibrillation: Secondary | ICD-10-CM

## 2017-09-26 DIAGNOSIS — K219 Gastro-esophageal reflux disease without esophagitis: Secondary | ICD-10-CM | POA: Diagnosis not present

## 2017-09-26 DIAGNOSIS — E559 Vitamin D deficiency, unspecified: Secondary | ICD-10-CM | POA: Diagnosis not present

## 2017-09-26 DIAGNOSIS — M1000 Idiopathic gout, unspecified site: Secondary | ICD-10-CM

## 2017-09-26 DIAGNOSIS — E782 Mixed hyperlipidemia: Secondary | ICD-10-CM

## 2017-09-26 DIAGNOSIS — R7309 Other abnormal glucose: Secondary | ICD-10-CM

## 2017-09-26 DIAGNOSIS — R7303 Prediabetes: Secondary | ICD-10-CM

## 2017-09-26 DIAGNOSIS — I10 Essential (primary) hypertension: Secondary | ICD-10-CM

## 2017-09-26 DIAGNOSIS — Z79899 Other long term (current) drug therapy: Secondary | ICD-10-CM | POA: Diagnosis not present

## 2017-09-26 MED ORDER — MINOXIDIL 10 MG PO TABS: ORAL_TABLET | ORAL | 3 refills | Status: DC

## 2017-09-26 NOTE — Patient Instructions (Signed)

## 2017-09-26 NOTE — Progress Notes (Signed)
This very nice 84 y.o. [place] presents for 6 month follow up with Hypertension, Hyperlipidemia, Pre-Diabetes and Vitamin D Deficiency. Patient has Gout controlled on his meds.      Patient has a multifactorial unstable gait attributed to bilat knee pains and a Peripheral Neuropathy with an undefined Balance Disorder with negative CT Head scan, PNCV's, EMG and negative serology for Myasthena Gravis and  is on a scooter from the [place].     Patient is treated for HTN (1983)  & BP has been controlled at home. Today's BP is elevated at 162/78 and repeated at 160/110. Patient also has cAfib with coumadin deferred due to his unstable gait and high fall risk and since his last OV here he has been started on Pradaxa by the [facility].  Patient has had no complaints of any cardiac type chest pain, palpitations, dyspnea / orthopnea / PND, dizziness, claudication, or dependent edema.     Hyperlipidemia is controlled with diet & meds. Patient denies myalgias or other med SE's. Last Lipids were at goal: Lab Results  Component Value Date   CHOL 173 03/05/2017   HDL 68 03/05/2017   LDLCALC 85 03/05/2017   TRIG 102 03/05/2017   CHOLHDL 2.5 03/05/2017      Also, the patient has history of PreDiabetes  (A1c 6.0%/2011) and has had no symptoms of reactive hypoglycemia, diabetic polys, paresthesias or visual blurring.  Last A1c was Normal & at goal: Lab Results  Component Value Date   HGBA1C 5.5 03/05/2017      Further, the patient also has history of Vitamin D Deficiency ("36"/2008)  and supplements vitamin D without any suspected side-effects. Last vitamin D was   Lab Results  Component Value Date   VD25OH 48 03/05/2017   Current Outpatient Medications on File Prior to Visit  Medication Sig  . bisoprolol-hydrochlorothiazide (ZIAC) 10-6.25 MG tablet Take 1/2 to 1 tablet daily for BP  . Cholecalciferol (VITAMIN D PO) Take 2,000 Units by mouth daily.   Marland Kitchen diltiazem (CARDIZEM CD) 180 MG 24 hr capsule Take 1  capsule (180 mg total) by mouth daily.  Marland Kitchen doxazosin (CARDURA) 8 MG tablet Take 8 mg by mouth at bedtime.  . fish oil-omega-3 fatty acids 1000 MG capsule Take 1 g by mouth daily.  . furosemide (LASIX) 40 MG tablet Take 1 tablet (40 mg total) by mouth daily.  Marland Kitchen lisinopril (PRINIVIL,ZESTRIL) 40 MG tablet TAKE 1 TAB IN THE MORNING AND 1 TAB IN THE EVENING OR AS DIRECTED.  . Magnesium 250 MG TABS Take 250 mg by mouth 2 (two) times daily.   . ondansetron (ZOFRAN) 4 MG tablet Take 1 tablet (4 mg total) by mouth every 6 (six) hours as needed.  . prochlorperazine (COMPAZINE) 5 MG tablet Take 1 tablet 3 to 4 x/day if needed for Nausea /Vomitting  . sertraline (ZOLOFT) 100 MG tablet Take 50 mg by mouth daily.  . traMADol (ULTRAM) 50 MG tablet Take by mouth every 6 (six) hours as needed.   No current facility-administered medications on file prior to visit.    No Known Allergies PMHx:   Past Medical History:  Diagnosis Date  . Arthritis   . BPH (benign prostatic hyperplasia)   . GERD (gastroesophageal reflux disease)   . Gout   . Hyperlipidemia   . Hypertension   . Prediabetes   . Vitamin D deficiency    Immunization History  Administered Date(s) Administered  . Influenza, High Dose Seasonal PF 06/08/2014  .  Influenza-Unspecified 05/22/2015  . Pneumococcal Conjugate-13 05/25/2014  . Pneumococcal Polysaccharide-23 05/28/2013  . Td 07/20/2005   Past Surgical History:  Procedure Laterality Date  . BACK SURGERY    . EYE SURGERY    . HEMORRHOID SURGERY    . LEG SURGERY     FHx:    Reviewed / unchanged  SHx:    Reviewed / unchanged  Systems Review:  Constitutional: Denies fever, chills, wt changes, headaches, insomnia, fatigue, night sweats, change in appetite. Eyes: Denies redness, blurred vision, diplopia, discharge, itchy, watery eyes.  ENT: Denies discharge, congestion, post nasal drip, epistaxis, sore throat, earache, hearing loss, dental pain, tinnitus, vertigo, sinus pain,  snoring.  CV: Denies chest pain, palpitations, irregular heartbeat, syncope, dyspnea, diaphoresis, orthopnea, PND, claudication or edema. Respiratory: denies cough, dyspnea, DOE, pleurisy, hoarseness, laryngitis, wheezing.  Gastrointestinal: Denies dysphagia, odynophagia, heartburn, reflux, water brash, abdominal pain or cramps, nausea, vomiting, bloating, diarrhea, constipation, hematemesis, melena, hematochezia  or hemorrhoids. Genitourinary: Denies dysuria, frequency, urgency, nocturia, hesitancy, discharge, hematuria or flank pain. Musculoskeletal: Denies arthralgias, myalgias, stiffness, jt. swelling, pain, limping or strain/sprain.  Skin: Denies pruritus, rash, hives, warts, acne, eczema or change in skin lesion(s). Neuro: No weakness, tremor, incoordination, spasms, paresthesia or pain. Psychiatric: Denies confusion, memory loss or sensory loss. Endo: Denies change in weight, skin or hair change.  Heme/Lymph: No excessive bleeding, bruising or enlarged lymph nodes.  Physical Exam  BP (!) 162/78   Pulse 76   Temp 97.7 F (36.5 C)   Resp 16    O2 sat 99%  Re-check BP 160/110 Rt wrist and Rt arm cuff.   Appears elderly , chronically ill and in no distress.  Eyes: PERRLA, EOMs, conjunctiva no swelling or erythema. Sinuses: No frontal/maxillary tenderness ENT/Mouth: EAC's clear, TM's nl w/o erythema, bulging. Nares clear w/o erythema, swelling, exudates. Oropharynx clear without erythema or exudates. Oral hygiene is good. Tongue normal, non obstructing. Hearing intact.  Neck: Supple. Thyroid nl. Car 2+/2+ without bruits, nodes or JVD. Chest: Respirations nl with BS clear & equal w/o rales, rhonchi, wheezing or stridor.  Cor: Heart sounds normal w/ regular rate and rhythm without sig. murmurs, gallops, clicks or rubs. Peripheral pulses normal and equal  without edema.  Abdomen: Soft & bowel sounds normal. Non-tender w/o guarding, rebound, hernias, masses or organomegaly.  Lymphatics:  Unremarkable.  Musculoskeletal: Generalized decrease in muscle power tone & bulk and in an elec scooter Skin: Warm, dry without exposed rashes, lesions or ecchymosis apparent.  Neuro: Cranial nerves intact, reflexes equal bilaterally. Sensory-motor testing grossly intact. Tendon reflexes flat to absent.  Pysch: Alert & oriented x 3.  Insight and judgement nl & appropriate. No ideations.  Assessment and Plan:  1. Essential hypertension  - Continue medication, monitor blood pressure at home.  - Continue DASH diet. Reminder to go to the ER if any CP,  SOB, nausea, dizziness, severe HA, changes vision/speech.  - CBC with Differential/Platelet - BASIC METABOLIC PANEL WITH GFR - Magnesium - TSH  - Add - minoxidil (LONITEN) 10 MG tablet; Take 1 tablet daily for BP  Dispense: 90 tablet; Refill: 3  2. Hyperlipidemia, mixed  - Continue diet/meds, exercise,& lifestyle modifications.  - Continue monitor periodic cholesterol/liver & renal functions   - Hepatic function panel - Lipid panel - TSH  3. Prediabetes  - Hemoglobin A1c - Insulin, random  4. Vitamin D deficiency  - Continue diet, exercise, lifestyle modifications.  - Monitor appropriate labs. - Continue supplementation. - VITAMIN D 25 Hydroxy  5. Abnormal glucose  - Hemoglobin A1c - Insulin, random  6. Idiopathic gout  - VITAMIN D 25 Hydroxy - Uric acid  7. Atrial fibrillation, (HCC)  - TSH  8. Gastroesophageal reflux disease - CBC with Differential/Platelet  9. Medication management  - CBC with Differential/Platelet - BASIC METABOLIC PANEL WITH GFR - Hepatic function panel - Magnesium - Lipid panel - TSH - Hemoglobin A1c - Insulin, random - VITAMIN D 25 Hydroxy  - Uric acid       Discussed  regular exercise, BP monitoring, weight control to achieve/maintain BMI less than 25 and discussed med and SE's. Recommended labs to assess and monitor clinical status with further disposition pending results  of labs. Over 30 minutes of exam, counseling, chart review was performed.

## 2017-09-27 LAB — CBC WITH DIFFERENTIAL/PLATELET
BASOS PCT: 0.5 %
Basophils Absolute: 29 cells/uL (ref 0–200)
EOS PCT: 0.7 %
Eosinophils Absolute: 40 cells/uL (ref 15–500)
HCT: 44.7 % (ref 38.5–50.0)
Hemoglobin: 15.7 g/dL (ref 13.2–17.1)
Lymphs Abs: 1425 cells/uL (ref 850–3900)
MCH: 33 pg (ref 27.0–33.0)
MCHC: 35.1 g/dL (ref 32.0–36.0)
MCV: 93.9 fL (ref 80.0–100.0)
MONOS PCT: 5.1 %
MPV: 10.1 fL (ref 7.5–12.5)
Neutro Abs: 3916 cells/uL (ref 1500–7800)
Neutrophils Relative %: 68.7 %
PLATELETS: 193 10*3/uL (ref 140–400)
RBC: 4.76 10*6/uL (ref 4.20–5.80)
RDW: 11.9 % (ref 11.0–15.0)
TOTAL LYMPHOCYTE: 25 %
WBC mixed population: 291 cells/uL (ref 200–950)
WBC: 5.7 10*3/uL (ref 3.8–10.8)

## 2017-09-27 LAB — HEPATIC FUNCTION PANEL
AG Ratio: 1.6 (calc) (ref 1.0–2.5)
ALBUMIN MSPROF: 4.4 g/dL (ref 3.6–5.1)
ALT: 11 U/L (ref 9–46)
AST: 17 U/L (ref 10–35)
Alkaline phosphatase (APISO): 60 U/L (ref 40–115)
BILIRUBIN INDIRECT: 1.1 mg/dL (ref 0.2–1.2)
Bilirubin, Direct: 0.3 mg/dL — ABNORMAL HIGH (ref 0.0–0.2)
GLOBULIN: 2.7 g/dL (ref 1.9–3.7)
TOTAL PROTEIN: 7.1 g/dL (ref 6.1–8.1)
Total Bilirubin: 1.4 mg/dL — ABNORMAL HIGH (ref 0.2–1.2)

## 2017-09-27 LAB — LIPID PANEL
CHOLESTEROL: 178 mg/dL (ref <200)
HDL: 71 mg/dL (ref >40)
LDL CHOLESTEROL (CALC): 86 mg/dL
Non-HDL Cholesterol (Calc): 107 mg/dL (calc) (ref <130)
Total CHOL/HDL Ratio: 2.5 (calc) (ref <5.0)
Triglycerides: 113 mg/dL (ref <150)

## 2017-09-27 LAB — BASIC METABOLIC PANEL WITH GFR
BUN/Creatinine Ratio: 22 (calc) (ref 6–22)
BUN: 10 mg/dL (ref 7–25)
CALCIUM: 9.6 mg/dL (ref 8.6–10.3)
CHLORIDE: 95 mmol/L — AB (ref 98–110)
CO2: 29 mmol/L (ref 20–32)
Creat: 0.46 mg/dL — ABNORMAL LOW (ref 0.70–1.11)
GFR, Est African American: 119 mL/min/{1.73_m2} (ref >=60)
GFR, Est Non African American: 102 mL/min/{1.73_m2} (ref >=60)
GLUCOSE: 89 mg/dL (ref 65–99)
Potassium: 4 mmol/L (ref 3.5–5.3)
Sodium: 134 mmol/L — ABNORMAL LOW (ref 135–146)

## 2017-09-27 LAB — URIC ACID: Uric Acid, Serum: 5.8 mg/dL (ref 4.0–8.0)

## 2017-09-27 LAB — TSH: TSH: 0.8 mIU/L (ref 0.40–4.50)

## 2017-09-27 LAB — MAGNESIUM: MAGNESIUM: 2 mg/dL (ref 1.5–2.5)

## 2017-09-27 LAB — HEMOGLOBIN A1C
HEMOGLOBIN A1C: 5.4 %{Hb} (ref <5.7)
Mean Plasma Glucose: 108 (calc)
eAG (mmol/L): 6 (calc)

## 2017-09-27 LAB — INSULIN, RANDOM: INSULIN: 2.2 u[IU]/mL (ref 2.0–19.6)

## 2017-09-27 LAB — VITAMIN D 25 HYDROXY (VIT D DEFICIENCY, FRACTURES): VIT D 25 HYDROXY: 56 ng/mL (ref 30–100)

## 2017-12-24 NOTE — Progress Notes (Signed)
MEDICARE ANNUAL WELLNESS VISIT AND FOLLOW UP Assessment:   Diagnoses and all orders for this visit:  Encounter for Medicare annual wellness exam  Essential hypertension Reasonably close to goal; labile in past; continue with current medications  Monitor blood pressure at home; call if consistently over 140/80 Continue DASH diet.   Reminder to go to the ER if any CP, SOB, nausea, dizziness, severe HA, changes vision/speech, left arm numbness and tingling and jaw pain.  Chronic atrial fibrillation (HCC) Poor candidate for anticoag due to fall risk Rate controlled today Follows with cardiology  Gastroesophageal reflux disease, esophagitis presence not specified Well managed on current medications Discussed diet, avoiding triggers and other lifestyle changes  Osteoarthritis of both knees, unspecified osteoarthritis type Poor surgical candidate, continue pain management  BPH with obstruction/lower urinary tract symptoms Continue doxazosin PSAs at CPE  Vitamin D deficiency At goal at recent check; continue to recommend supplementation for goal of 70-100 Defer vitamin D level  Spinal stenosis, lumbar region, with neurogenic claudication Severe, poor candidate for surgery, does pain management  Other abnormal glucose Recent A1Cs at goal Discussed diet/exercise, weight management  Defer A1C; check BMP  Multifactorial gait disorder Uses scooter from [place] Declines PT due to worsening pain from arthritis Reminded to use walker in home, continue with scooter  BMI 25 Continue to recommend diet heavy in fruits and veggies and low in animal meats, cheeses, and dairy products, appropriate calorie intake Discuss exercise recommendations routinely Continue to monitor weight at each visit  Medication management CBC, CMP/GFR  Idiopathic gout, unspecified chronicity, unspecified site Continue allopurinol Diet discussed Check uric acid as needed  Dyslipidemia At goal off of  medications Continue low cholesterol diet and exercise.  Check lipid panel.   Depression, controlled Continue medications: zoloft - increase back up to 100 mg daily Lifestyle discussed: diet/exerise, sleep hygiene, stress management, hydration  Chronic pain syndrome Discussed limiting opioid use as much as possible to minimize risks Follows with pain management at [facility]  BMI 25 Long discussion about weight loss, diet, and exercise Recommended diet heavy in fruits and veggies and low in animal meats, cheeses, and dairy products, appropriate calorie intake Discussed appropriate weight for height  Follow up at next visit   Over 30 minutes of exam, counseling, chart review, and critical decision making was performed  Future Appointments  Date Time Provider Wallace  04/09/2018 10:00 AM Unk Pinto, MD [facility] None     Plan:   During the course of the visit the patient was educated and counseled about appropriate screening and preventive services including:    Pneumococcal vaccine   Influenza vaccine  Prevnar 13  Td vaccine  Screening electrocardiogram  Colorectal cancer screening  Diabetes screening  Glaucoma screening  Nutrition counseling    Subjective:  Tracy Brandt is a 84 y.o. male who presents for Medicare Annual Wellness Visit and 3 month follow up for HTN, hyperlipidemia, glucose management, and vitamin D Def. Patient has a multifactorial unstable gait attributed to bilat knee pains and a Peripheral Neuropathy with an undefined Balance Disorder with negative CT Head scan, PNCV's, EMG and negative serology for Myasthena Gravis and  is on a scooter from the [place]. Patient also has cAfib with coumadin deferred due to his unstable gait and high fall risk but alternatively has been started on [facility] by [facility]   BMI is Body mass index is 25.8 kg/m., he has not been working on diet and  exercise. Wt Readings  from Last 3 Encounters:  12/25/17 185 lb (83.9 kg)  03/05/17 190 lb (86.2 kg)  10/24/16 200 lb (90.7 kg)   His blood pressure has been controlled at home, today their BP is BP: (!) 142/80 He does not workout. He denies chest pain, shortness of breath, dizziness.   He is not on cholesterol medication and denies myalgias. His cholesterol is at goal. The cholesterol last visit was:   Lab Results  Component Value Date   CHOL 178 09/26/2017   HDL 71 09/26/2017   LDLCALC 86 09/26/2017   TRIG 113 09/26/2017   CHOLHDL 2.5 09/26/2017   He has not been working on diet and exercise for glucose management, and denies foot ulcerations, increased appetite, nausea, polydipsia, polyuria, visual disturbances, vomiting and weight loss. Last A1C in the office was:  Lab Results  Component Value Date   HGBA1C 5.4 09/26/2017   Last GFR Lab Results  Component Value Date   GFRNONAA 102 09/26/2017   Patient is on Vitamin D supplement and is approaching goal:    Lab Results  Component Value Date   VD25OH 56 09/26/2017      Medication Review:   Current Outpatient Medications (Cardiovascular):  .  bisoprolol-hydrochlorothiazide (ZIAC) 10-6.25 MG tablet, Take 1/2 to 1 tablet daily for BP .  diltiazem (CARDIZEM CD) 180 MG 24 hr capsule, Take 1 capsule (180 mg total) by mouth daily. Marland Kitchen  doxazosin (CARDURA) 8 MG tablet, Take 8 mg by mouth at bedtime. Marland Kitchen  lisinopril (PRINIVIL,ZESTRIL) 40 MG tablet, TAKE 1 TAB IN THE MORNING AND 1 TAB IN THE EVENING OR AS DIRECTED. Marland Kitchen  minoxidil (LONITEN) 10 MG tablet, Take 1 tablet daily for BP .  furosemide (LASIX) 40 MG tablet, Take 1 tablet (40 mg total) by mouth daily.   Current Outpatient Medications (Analgesics):  .  traMADol (ULTRAM) 50 MG tablet, Take by mouth every 6 (six) hours as needed.   Current Outpatient Medications (Other):  Marland Kitchen  Cholecalciferol (VITAMIN D PO), Take 2,000 Units by mouth daily.  .  fish oil-omega-3 fatty acids 1000 MG capsule, Take 1 g by  mouth daily. .  Magnesium 250 MG TABS, Take 250 mg by mouth 2 (two) times daily.  .  ondansetron (ZOFRAN) 4 MG tablet, Take 1 tablet (4 mg total) by mouth every 6 (six) hours as needed. .  prochlorperazine (COMPAZINE) 5 MG tablet, Take 1 tablet 3 to 4 x/day if needed for Nausea /Vomitting .  sertraline (ZOLOFT) 100 MG tablet, Take 50 mg by mouth daily.  Allergies: No Known Allergies  Current Problems (verified) has Essential hypertension; Dyslipidemia; Other abnormal glucose; Vitamin D deficiency; Gout; Overweight (BMI 25.0-29.9); Spinal stenosis, lumbar region, with neurogenic claudication; Depression, controlled; Medication management; Atrial fibrillation ([place]); Anticoagulation therapy not indicated; Multifactorial gait disorder; GERD (gastroesophageal reflux disease); BPH with obstruction/lower urinary tract symptoms; DJD (degenerative joint disease) of knee; and Chronic pain syndrome on their problem list.  Screening Tests Immunization History  Administered Date(s) Administered  . Influenza, High Dose Seasonal PF 06/08/2014  . Influenza-Unspecified 05/22/2015  . Pneumococcal Conjugate-13 05/25/2014  . Pneumococcal Polysaccharide-23 05/28/2013  . Td 07/20/2005   Preventative care: Last colonoscopy: remote, age precludes him from further  Prior vaccinations: TD or Tdap: 2006 - wants another, but will get at the [place]   Influenza: 2016  Pneumococcal: 2015 Prevnar13: 2016   Names of Other Physician/Practitioners you currently use: 1. [facility] here for primary care 2. [facility], last visit 2018 3. Does not see, dentures  Patient Care Team: Unk Pinto, MD as PCP - General (Internal Medicine)  Surgical: He  has a past surgical history that includes Back surgery; Leg Surgery; Hemorrhoid surgery; and Eye surgery. Family His family history includes Heart disease in his father and mother; Hypertension in his mother. Social history   He reports that he has never smoked. He has never used smokeless tobacco. He reports that he does not drink alcohol or use drugs.  MEDICARE WELLNESS OBJECTIVES: Physical activity: Current Exercise Habits: The patient does not participate in regular exercise at present, Exercise limited by: orthopedic condition(s) Cardiac risk factors: Cardiac Risk Factors include: advanced age (>41men, >64 women);dyslipidemia;male gender;hypertension;obesity (BMI >30kg/m2);sedentary lifestyle;smoking/ tobacco exposure Depression/mood screen:   Depression screen [facility] 2/9 12/25/2017  Decreased Interest 1  Down, Depressed, Hopeless 1  PHQ - 2 Score 2  Altered sleeping 0  Tired, decreased energy 1  Change in appetite 1  Feeling bad or failure about yourself  1  Trouble concentrating 2  Moving slowly or fidgety/restless 1  Suicidal thoughts 0  PHQ-9 Score 8  Difficult doing work/chores Somewhat difficult    ADLs:  In your present state of health, do you have any difficulty performing the following activities: 12/25/2017 09/26/2017  Hearing? Y Y  Comment deaf in left ear, has hearing aid for right hearing aids  Vision? N N  Difficulty concentrating or making decisions? N N  Walking or climbing stairs? Y Y  Comment On electric scooter, ramps at home, walker in home unstable gait due to periph neuropathy  Dressing or bathing? N N  Comment Has home help 5 days a week x 2 hours -  Doing errands, shopping? N Y  Comment Driven by son son transports  Conservation officer, nature and eating ? N -  Comment Has meals on wheels -  Using the Toilet? N -  Comment Has rails to assist -  In the past six months, have you accidently leaked urine? N -  Do you have problems with loss of bowel control? N -  Managing your Medications? N -  Managing your Finances? N -  Housekeeping or managing your Housekeeping? N -  Comment Has home help 5 days a week -  Some recent data might be hidden     Cognitive Testing  Alert? Yes  Normal  Appearance?Yes  Oriented to person? Yes  Place? Yes   Time? Yes  Recall of three objects?  Yes  Can perform simple calculations? Yes  Displays appropriate judgment?Yes  Can read the correct time from a watch face?Yes  EOL planning: Does Patient Have a Medical Advance Directive?: Yes Type of Advance Directive: Healthcare Power of Attorney, Living will Does patient want to make changes to medical advance directive?: No - Patient declined Copy of [place] in Chart?: No - copy requested   Objective:   Today's Vitals   12/25/17 1002 12/25/17 1036  BP: (!) 148/80 (!) 142/80  Pulse: 74   Temp: (!) 97.5 F (36.4 C)   SpO2: 98%   Weight: 185 lb (83.9 kg)   PainSc: 6     Body mass index is 25.8 kg/m.  General appearance: alert, no distress, WD/WN, male HEENT: normocephalic, sclerae anicteric, TMs pearly, nares patent, no discharge or erythema, pharynx normal Oral cavity: MMM, no lesions Neck: supple, no lymphadenopathy, no thyromegaly, no masses Heart: Irregularly irregular, normal S1, S2, no murmurs Lungs: CTA bilaterally, no wheezes, rhonchi,  or rales Abdomen: +bs, soft, non tender, non distended, no masses, no hepatomegaly, no splenomegaly Musculoskeletal: nontender, no swelling, no obvious deformity Extremities: no edema, no cyanosis, no clubbing Pulses: 2+ symmetric, upper and lower extremities, normal cap refill Neurological: alert, oriented x 3, CN2-12 intact, strength normal upper extremities, symmetrically weak in lower extremities, sensation normal throughout, rides scooter, very weak with attempts to stand, unable to stand up from full seated position Psychiatric: normal affect, behavior normal, pleasant   Medicare Attestation I have personally reviewed: The patient's medical and social history Their use of alcohol, tobacco or illicit drugs Their current medications and supplements The patient's functional ability including ADLs,fall risks, home  safety risks, cognitive, and hearing and visual impairment Diet and physical activities Evidence for depression or mood disorders  The patient's weight, height, BMI, and visual acuity have been recorded in the chart.  I have made referrals, counseling, and provided education to the patient based on review of the above and I have provided the patient with a written personalized care plan for preventive services.     Izora Ribas, NP   12/25/2017

## 2017-12-25 ENCOUNTER — Encounter: Payer: Self-pay | Admitting: Adult Health

## 2017-12-25 ENCOUNTER — Ambulatory Visit (INDEPENDENT_AMBULATORY_CARE_PROVIDER_SITE_OTHER): Payer: Medicare Other | Admitting: Adult Health

## 2017-12-25 VITALS — BP 142/80 | HR 74 | Temp 97.5°F | Wt 185.0 lb

## 2017-12-25 DIAGNOSIS — M170 Bilateral primary osteoarthritis of knee: Secondary | ICD-10-CM | POA: Diagnosis not present

## 2017-12-25 DIAGNOSIS — I482 Chronic atrial fibrillation, unspecified: Secondary | ICD-10-CM

## 2017-12-25 DIAGNOSIS — Z79899 Other long term (current) drug therapy: Secondary | ICD-10-CM

## 2017-12-25 DIAGNOSIS — E559 Vitamin D deficiency, unspecified: Secondary | ICD-10-CM | POA: Diagnosis not present

## 2017-12-25 DIAGNOSIS — R2689 Other abnormalities of gait and mobility: Secondary | ICD-10-CM

## 2017-12-25 DIAGNOSIS — E785 Hyperlipidemia, unspecified: Secondary | ICD-10-CM | POA: Diagnosis not present

## 2017-12-25 DIAGNOSIS — F32A Depression, unspecified: Secondary | ICD-10-CM

## 2017-12-25 DIAGNOSIS — N401 Enlarged prostate with lower urinary tract symptoms: Secondary | ICD-10-CM

## 2017-12-25 DIAGNOSIS — Z0000 Encounter for general adult medical examination without abnormal findings: Secondary | ICD-10-CM

## 2017-12-25 DIAGNOSIS — K219 Gastro-esophageal reflux disease without esophagitis: Secondary | ICD-10-CM | POA: Diagnosis not present

## 2017-12-25 DIAGNOSIS — F329 Major depressive disorder, single episode, unspecified: Secondary | ICD-10-CM | POA: Diagnosis not present

## 2017-12-25 DIAGNOSIS — G894 Chronic pain syndrome: Secondary | ICD-10-CM

## 2017-12-25 DIAGNOSIS — Z0001 Encounter for general adult medical examination with abnormal findings: Secondary | ICD-10-CM | POA: Diagnosis not present

## 2017-12-25 DIAGNOSIS — M48062 Spinal stenosis, lumbar region with neurogenic claudication: Secondary | ICD-10-CM

## 2017-12-25 DIAGNOSIS — R7309 Other abnormal glucose: Secondary | ICD-10-CM | POA: Diagnosis not present

## 2017-12-25 DIAGNOSIS — I10 Essential (primary) hypertension: Secondary | ICD-10-CM

## 2017-12-25 DIAGNOSIS — R6889 Other general symptoms and signs: Secondary | ICD-10-CM

## 2017-12-25 DIAGNOSIS — M1000 Idiopathic gout, unspecified site: Secondary | ICD-10-CM | POA: Diagnosis not present

## 2017-12-25 DIAGNOSIS — Z6829 Body mass index (BMI) 29.0-29.9, adult: Secondary | ICD-10-CM

## 2017-12-25 DIAGNOSIS — N138 Other obstructive and reflux uropathy: Secondary | ICD-10-CM

## 2017-12-25 NOTE — Patient Instructions (Signed)
Try sennakot for constipation (over the counter)   Constipation, Adult Constipation is when a person has fewer bowel movements in a week than normal, has difficulty having a bowel movement, or has stools that are dry, hard, or larger than normal. Constipation may be caused by an underlying condition. It may become worse with age if a person takes certain medicines and does not take in enough fluids. Follow these instructions at home: Eating and drinking   Eat foods that have a lot of fiber, such as fresh fruits and vegetables, whole grains, and beans.  Limit foods that are high in fat, low in fiber, or overly processed, such as french fries, hamburgers, cookies, candies, and soda.  Drink enough fluid to keep your urine clear or pale yellow. General instructions  Exercise regularly or as told by your health care provider.  Go to the restroom when you have the urge to go. Do not hold it in.  Take over-the-counter and prescription medicines only as told by your health care provider. These include any fiber supplements.  Practice pelvic floor retraining exercises, such as deep breathing while relaxing the lower abdomen and pelvic floor relaxation during bowel movements.  Watch your condition for any changes.  Keep all follow-up visits as told by your health care provider. This is important. Contact a health care provider if:  You have pain that gets worse.  You have a fever.  You do not have a bowel movement after 4 days.  You vomit.  You are not hungry.  You lose weight.  You are bleeding from the anus.  You have thin, pencil-like stools. Get help right away if:  You have a fever and your symptoms suddenly get worse.  You leak stool or have blood in your stool.  Your abdomen is bloated.  You have severe pain in your abdomen.  You feel dizzy or you faint. This information is not intended to replace advice given to you by your health care provider. Make sure you  discuss any questions you have with your health care provider. Document Released: 05/05/2004 Document Revised: 02/25/2016 Document Reviewed: 01/26/2016 Elsevier Interactive Patient Education  2018 [employer].

## 2017-12-26 LAB — CBC WITH DIFFERENTIAL/PLATELET
BASOS PCT: 0.5 %
Basophils Absolute: 31 cells/uL (ref 0–200)
Eosinophils Absolute: 43 cells/uL (ref 15–500)
Eosinophils Relative: 0.7 %
HCT: 44.9 % (ref 38.5–50.0)
HEMOGLOBIN: 15.3 g/dL (ref 13.2–17.1)
Lymphs Abs: 1391 cells/uL (ref 850–3900)
MCH: 32.2 pg (ref 27.0–33.0)
MCHC: 34.1 g/dL (ref 32.0–36.0)
MCV: 94.5 fL (ref 80.0–100.0)
MONOS PCT: 5.3 %
MPV: 10.2 fL (ref 7.5–12.5)
NEUTROS ABS: 4313 {cells}/uL (ref 1500–7800)
Neutrophils Relative %: 70.7 %
Platelets: 203 10*3/uL (ref 140–400)
RBC: 4.75 10*6/uL (ref 4.20–5.80)
RDW: 11.8 % (ref 11.0–15.0)
Total Lymphocyte: 22.8 %
WBC mixed population: 323 cells/uL (ref 200–950)
WBC: 6.1 10*3/uL (ref 3.8–10.8)

## 2017-12-26 LAB — LIPID PANEL
CHOLESTEROL: 184 mg/dL (ref <200)
HDL: 65 mg/dL (ref >40)
LDL CHOLESTEROL (CALC): 99 mg/dL
Non-HDL Cholesterol (Calc): 119 mg/dL (calc) (ref <130)
Total CHOL/HDL Ratio: 2.8 (calc) (ref <5.0)
Triglycerides: 106 mg/dL (ref <150)

## 2017-12-26 LAB — COMPLETE METABOLIC PANEL WITH GFR
AG RATIO: 1.6 (calc) (ref 1.0–2.5)
ALT: 10 U/L (ref 9–46)
AST: 16 U/L (ref 10–35)
Albumin: 4.4 g/dL (ref 3.6–5.1)
Alkaline phosphatase (APISO): 65 U/L (ref 40–115)
BILIRUBIN TOTAL: 1.7 mg/dL — AB (ref 0.2–1.2)
BUN/Creatinine Ratio: 21 (calc) (ref 6–22)
BUN: 10 mg/dL (ref 7–25)
CHLORIDE: 95 mmol/L — AB (ref 98–110)
CO2: 30 mmol/L (ref 20–32)
Calcium: 9.8 mg/dL (ref 8.6–10.3)
Creat: 0.47 mg/dL — ABNORMAL LOW (ref 0.70–1.11)
GFR, EST AFRICAN AMERICAN: 117 mL/min/{1.73_m2} (ref >=60)
GFR, Est Non African American: 101 mL/min/{1.73_m2} (ref >=60)
Globulin: 2.7 g/dL (calc) (ref 1.9–3.7)
Glucose, Bld: 100 mg/dL — ABNORMAL HIGH (ref 65–99)
POTASSIUM: 4.1 mmol/L (ref 3.5–5.3)
Sodium: 134 mmol/L — ABNORMAL LOW (ref 135–146)
Total Protein: 7.1 g/dL (ref 6.1–8.1)

## 2017-12-26 LAB — MAGNESIUM: Magnesium: 1.8 mg/dL (ref 1.5–2.5)

## 2017-12-26 LAB — TSH: TSH: 0.78 m[IU]/L (ref 0.40–4.50)

## 2018-04-09 ENCOUNTER — Ambulatory Visit (INDEPENDENT_AMBULATORY_CARE_PROVIDER_SITE_OTHER): Payer: Medicare Other | Admitting: Internal Medicine

## 2018-04-09 ENCOUNTER — Encounter: Payer: Self-pay | Admitting: Internal Medicine

## 2018-04-09 VITALS — BP 174/68 | HR 80 | Temp 97.3°F | Resp 18 | Ht 71.0 in | Wt 195.2 lb

## 2018-04-09 DIAGNOSIS — Z79899 Other long term (current) drug therapy: Secondary | ICD-10-CM | POA: Diagnosis not present

## 2018-04-09 DIAGNOSIS — Z136 Encounter for screening for cardiovascular disorders: Secondary | ICD-10-CM | POA: Diagnosis not present

## 2018-04-09 DIAGNOSIS — I482 Chronic atrial fibrillation, unspecified: Secondary | ICD-10-CM

## 2018-04-09 DIAGNOSIS — N401 Enlarged prostate with lower urinary tract symptoms: Secondary | ICD-10-CM | POA: Diagnosis not present

## 2018-04-09 DIAGNOSIS — R7303 Prediabetes: Secondary | ICD-10-CM | POA: Diagnosis not present

## 2018-04-09 DIAGNOSIS — E559 Vitamin D deficiency, unspecified: Secondary | ICD-10-CM

## 2018-04-09 DIAGNOSIS — K219 Gastro-esophageal reflux disease without esophagitis: Secondary | ICD-10-CM | POA: Diagnosis not present

## 2018-04-09 DIAGNOSIS — R2689 Other abnormalities of gait and mobility: Secondary | ICD-10-CM | POA: Diagnosis not present

## 2018-04-09 DIAGNOSIS — I10 Essential (primary) hypertension: Secondary | ICD-10-CM

## 2018-04-09 DIAGNOSIS — Z8249 Family history of ischemic heart disease and other diseases of the circulatory system: Secondary | ICD-10-CM

## 2018-04-09 DIAGNOSIS — Z125 Encounter for screening for malignant neoplasm of prostate: Secondary | ICD-10-CM | POA: Diagnosis not present

## 2018-04-09 DIAGNOSIS — R7309 Other abnormal glucose: Secondary | ICD-10-CM

## 2018-04-09 DIAGNOSIS — Z1211 Encounter for screening for malignant neoplasm of colon: Secondary | ICD-10-CM

## 2018-04-09 DIAGNOSIS — E782 Mixed hyperlipidemia: Secondary | ICD-10-CM | POA: Diagnosis not present

## 2018-04-09 DIAGNOSIS — N138 Other obstructive and reflux uropathy: Secondary | ICD-10-CM

## 2018-04-09 DIAGNOSIS — Z1212 Encounter for screening for malignant neoplasm of rectum: Secondary | ICD-10-CM

## 2018-04-09 NOTE — Patient Instructions (Signed)

## 2018-04-09 NOTE — Progress Notes (Signed)
[facility]   Unk Pinto, M.D.     Uvaldo Bristle. Silverio Lay, P.A.-C Liane Comber, [place]                [address] [place] [address] Telephone [phone] Telefax [phone]  Comprehensive Evaluation & Examination     This very nice 84 y.o. [facility] presents for a comprehensive evaluation and management of multiple medical co-morbidities.  Patient has been followed for HTN, HLD, Prediabetes and Vitamin D Deficiency. His Gout is controlled w/his meds.      Patient has a multifactorial  gait disorder consequential of DJD with  bilat knee pains and also  a Peripheral Neuropathy with an undefined Balance Disorder. He has had an extensive w/u with negative CT Head scan, PNCV's, EMG and negative serology for Myasthena Gravis. Due to his unstable & single home status he is on a scooter from the [place].     HTN predates circa 1983. Patient is on a 6 drug regimen for  BP has not  been well controlled at home suspected due to inadvertent dose ommissions.  Today's BP is not at goal at 174/68. Patie has cAfib with only LD bASA prophylaxis due to his High Fall Risk. Patient denies any cardiac symptoms as chest pain, palpitations, shortness of breath, dizziness or ankle swelling.     Patient's hyperlipidemia is controlled with diet and medications. Patient denies myalgias or other medication SE's. Last lipids were at goal: Lab Results  Component Value Date   CHOL 184 12/25/2017   HDL 65 12/25/2017   LDLCALC 99 12/25/2017   TRIG 106 12/25/2017   CHOLHDL 2.8 12/25/2017      Patient has hx/o prediabete (A1c 6.0%/2011)  and patient denies reactive hypoglycemic symptoms, visual blurring, diabetic polys or paresthesias. Last A1c was Normal & at goal: Lab Results  Component Value Date   HGBA1C 5.4 09/26/2017       Finally, patient has history of Vitamin D Deficiency ("36"/2008)  and last vitamin D was  Lab  Results  Component Value Date   VD25OH 56 09/26/2017   Current Outpatient Medications on File Prior to Visit  Medication Sig  . bisoprolol-hydrochlorothiazide (ZIAC) 10-6.25 MG tablet Take 1/2 to 1 tablet daily for BP  . Cholecalciferol (VITAMIN D PO) Take 2,000 Units by mouth daily.   Marland Kitchen diltiazem (CARDIZEM CD) 180 MG 24 hr capsule Take 1 capsule (180 mg total) by mouth daily.  Marland Kitchen doxazosin (CARDURA) 8 MG tablet Take 8 mg by mouth at bedtime.  . fish oil-omega-3 fatty acids 1000 MG capsule Take 1 g by mouth daily.  Marland Kitchen lisinopril (PRINIVIL,ZESTRIL) 40 MG tablet TAKE 1 TAB IN THE MORNING AND 1 TAB IN THE EVENING OR AS DIRECTED.  . Magnesium 250 MG TABS Take 250 mg by mouth 2 (two) times daily.   . minoxidil (LONITEN) 10 MG tablet Take 1 tablet daily for BP  . ondansetron (ZOFRAN) 4 MG tablet Take 1 tablet (4 mg total) by mouth every 6 (six) hours as needed.  . prochlorperazine (COMPAZINE) 5 MG tablet Take 1 tablet 3 to 4 x/day if needed for Nausea /Vomitting  . sertraline (ZOLOFT) 100 MG tablet Take 100 mg by mouth daily.  . traMADol (ULTRAM) 50 MG tablet Take by mouth every 6 (six) hours as needed.  . furosemide (LASIX) 40 MG tablet Take 1 tablet (  40 mg total) by mouth daily.   No current facility-administered medications on file prior to visit.    No Known Allergies   Past Medical History:  Diagnosis Date  . Arthritis   . BPH (benign prostatic hyperplasia)   . GERD (gastroesophageal reflux disease)   . Gout   . Hyperlipidemia   . Hypertension   . Prediabetes   . Vitamin D deficiency    Health Maintenance  Topic Date Due  . INFLUENZA VACCINE  03/21/2018  . TETANUS/TDAP  12/26/2018 (Originally 07/21/2015)  . PNA vac Low Risk Adult  Completed   Immunization History  Administered Date(s) Administered  . Influenza, High Dose Seasonal PF 06/08/2014  . Influenza-Unspecified 05/22/2015  . Pneumococcal Conjugate-13 05/25/2014  . Pneumococcal Polysaccharide-23 05/28/2013  . Td  07/20/2005   Last Colon - refuses  Past Surgical History:  Procedure Laterality Date  . BACK SURGERY    . EYE SURGERY    . HEMORRHOID SURGERY    . LEG SURGERY     Family History  Problem Relation Age of Onset  . Heart disease Mother   . Hypertension Mother   . Heart disease Father   . Cancer Neg Hx   . Diabetes Neg Hx   . Stroke Neg Hx    Social History     ROS Constitutional: Denies fever, chills, weight loss/gain, headaches, insomnia,  night sweats or change in appetite. Does c/o fatigue. Eyes: Denies redness, blurred vision, diplopia, discharge, itchy or watery eyes.  ENT: Denies discharge, congestion, post nasal drip, epistaxis, sore throat, earache, hearing loss, dental pain, Tinnitus, Vertigo, Sinus pain or snoring.  Cardio: Denies chest pain, palpitations, irregular heartbeat, syncope, dyspnea, diaphoresis, orthopnea, PND, claudication or edema Respiratory: denies cough, dyspnea, DOE, pleurisy, hoarseness, laryngitis or wheezing.  Gastrointestinal: Denies dysphagia, heartburn, reflux, water brash, pain, cramps, nausea, vomiting, bloating, diarrhea, constipation, hematemesis, melena, hematochezia, jaundice or hemorrhoids Genitourinary: Denies dysuria, frequency, discharge, hematuria or flank pain. Has urgency, nocturia x 2-3 & occasional hesitancy. Musculoskeletal: C/o bilat knee pain, esp with standing. Denies Falls. Skin: Denies puritis, rash, hives, warts, acne, eczema or change in skin lesion Neuro: No weakness, tremor, incoordination, spasms, paresthesia or pain Psychiatric: Denies confusion, memory loss or sensory loss. Denies Depression. Endocrine: Denies change in weight, skin, hair change, nocturia, and paresthesia, diabetic polys, visual blurring or hyper / hypo glycemic episodes.  Heme/Lymph: No excessive bleeding, bruising or enlarged lymph nodes.  Physical Exam  BP (!) 174/68   Pulse 80   Temp (!) 97.3 F (36.3 C)   Resp 18   Wt 195 lb 3.2 oz (88.5 kg)    BMI 27.22 kg/m   General Appearance: Well nourished and well groomed and in no apparent distress.  Eyes: PERRLA, EOMs, conjunctiva no swelling or erythema, normal fundi and vessels. Sinuses: No frontal/maxillary tenderness ENT/Mouth: EACs patent / TMs  nl. Nares clear without erythema, swelling, mucoid exudates. Oral hygiene is good. No erythema, swelling, or exudate. Tongue normal, non-obstructing. Tonsils not swollen or erythematous. Hearing normal.  Neck: Supple, thyroid not palpable. No bruits, nodes or JVD. Respiratory: Respiratory effort normal.  BS equal and clear bilateral without rales, rhonci, wheezing or stridor. Cardio: Heart sounds are normal with regular rate and rhythm and no murmurs, rubs or gallops. Peripheral pulses are normal and equal bilaterally without edema. No aortic or femoral bruits. Chest: symmetric with normal excursions and percussion.  Abdomen: Soft, with Nl bowel sounds. Nontender, no guarding, rebound, hernias, masses, or organomegaly.  Lymphatics: Non  tender without lymphadenopathy.  Genitourinary: DRE - deferred for age. Musculoskeletal: Full ROM with generalized decrease in muscle power, tone & bulk and unstable gait with wide based stance. Also (+) Gower's w/difficulty attempting to stand. Skin: Warm and dry without rashes, lesions, cyanosis, clubbing or  ecchymosis.  Neuro: Cranial nerves intact, reflexes flat thru-out. Decreased muscle tone. Sensation decreased  to touch, vibratory and Monofilament to the toes bilaterally. Pysch: Alert and oriented X 3 with normal affect, insight and judgment appropriate.   Assessment and Plan  1. Essential hypertension  - EKG 12-Lead - [place], RETROPERITNL ABD,  LTD - Microalbumin / creatinine urine ratio - CBC with Differential/Platelet - COMPLETE METABOLIC PANEL WITH GFR - Magnesium - TSH  2. Hyperlipidemia, mixed  - EKG 12-Lead - [place], RETROPERITNL ABD,  LTD - Lipid panel - TSH  3. Abnormal glucose  - EKG  12-Lead - [place], RETROPERITNL ABD,  LTD - Hemoglobin A1c - Insulin, random  4. Vitamin D deficiency  - VITAMIN D 25 Hydroxyl 5. Prediabetes  - EKG 12-Lead - [place], RETROPERITNL ABD,  LTD - Hemoglobin A1c - Insulin, random  6. Chronic atrial fibrillation (HCC)  - EKG 12-Lead  7. Gastroesophageal reflux disease  - CBC with Differential/Platelet  8. Multifactorial gait disorder  - LOW EXTREMITY NEUR EXAM DOCUM  9. Screening for colorectal cancer  - POC Hemoccult Bld/Stl   10. BPH with obstruction/lower urinary tract symptoms  - PSA  11. Prostate cancer screening  - PSA  12. Screening for ischemic heart disease  - EKG 12-Lead  13. FHx: heart disease  - EKG 12-Lead - [place], RETROPERITNL ABD,  LTD  14. Screening for AAA (aortic abdominal aneurysm)  - [place], RETROPERITNL ABD,  LTD  15. Medication management  - Urinalysis, Routine w reflex microscopic - Microalbumin / creatinine urine ratio - CBC with Differential/Platelet - COMPLETE METABOLIC PANEL WITH GFR - Magnesium - Lipid panel - TSH - Hemoglobin A1c - Insulin, random - VITAMIN D 25 Hydroxyl        Patient was counseled in prudent diet, weight control to achieve/maintain BMI less than 25, BP monitoring, regular exercise and medications as discussed.  Discussed med effects and SE's. Routine screening labs and tests as requested with regular follow-up as recommended. Over 40 minutes of exam, counseling, chart review and high complex critical decision making was performed

## 2018-04-10 LAB — LIPID PANEL
CHOLESTEROL: 162 mg/dL (ref <200)
HDL: 76 mg/dL (ref >40)
LDL Cholesterol (Calc): 71 mg/dL (calc)
NON-HDL CHOLESTEROL (CALC): 86 mg/dL (ref <130)
Total CHOL/HDL Ratio: 2.1 (calc) (ref <5.0)
Triglycerides: 73 mg/dL (ref <150)

## 2018-04-10 LAB — COMPLETE METABOLIC PANEL WITH GFR
AG RATIO: 1.8 (calc) (ref 1.0–2.5)
ALT: 10 U/L (ref 9–46)
AST: 15 U/L (ref 10–35)
Albumin: 4.2 g/dL (ref 3.6–5.1)
Alkaline phosphatase (APISO): 54 U/L (ref 40–115)
BILIRUBIN TOTAL: 0.9 mg/dL (ref 0.2–1.2)
BUN/Creatinine Ratio: 22 (calc) (ref 6–22)
BUN: 12 mg/dL (ref 7–25)
CHLORIDE: 102 mmol/L (ref 98–110)
CO2: 27 mmol/L (ref 20–32)
Calcium: 9.1 mg/dL (ref 8.6–10.3)
Creat: 0.55 mg/dL — ABNORMAL LOW (ref 0.70–1.11)
GFR, EST AFRICAN AMERICAN: 110 mL/min/{1.73_m2} (ref >=60)
GFR, Est Non African American: 95 mL/min/{1.73_m2} (ref >=60)
Globulin: 2.4 g/dL (calc) (ref 1.9–3.7)
Glucose, Bld: 110 mg/dL — ABNORMAL HIGH (ref 65–99)
POTASSIUM: 4.2 mmol/L (ref 3.5–5.3)
SODIUM: 140 mmol/L (ref 135–146)
Total Protein: 6.6 g/dL (ref 6.1–8.1)

## 2018-04-10 LAB — CBC WITH DIFFERENTIAL/PLATELET
BASOS ABS: 32 {cells}/uL (ref 0–200)
Basophils Relative: 0.6 %
EOS ABS: 42 {cells}/uL (ref 15–500)
Eosinophils Relative: 0.8 %
HCT: 41.2 % (ref 38.5–50.0)
Hemoglobin: 13.7 g/dL (ref 13.2–17.1)
Lymphs Abs: 1224 cells/uL (ref 850–3900)
MCH: 31.9 pg (ref 27.0–33.0)
MCHC: 33.3 g/dL (ref 32.0–36.0)
MCV: 95.8 fL (ref 80.0–100.0)
MPV: 10.2 fL (ref 7.5–12.5)
Monocytes Relative: 4.7 %
NEUTROS PCT: 70.8 %
Neutro Abs: 3752 cells/uL (ref 1500–7800)
PLATELETS: 204 10*3/uL (ref 140–400)
RBC: 4.3 10*6/uL (ref 4.20–5.80)
RDW: 11.9 % (ref 11.0–15.0)
TOTAL LYMPHOCYTE: 23.1 %
WBC mixed population: 249 cells/uL (ref 200–950)
WBC: 5.3 10*3/uL (ref 3.8–10.8)

## 2018-04-10 LAB — URINALYSIS, ROUTINE W REFLEX MICROSCOPIC
Bilirubin Urine: NEGATIVE
GLUCOSE, UA: NEGATIVE
Hgb urine dipstick: NEGATIVE
Ketones, ur: NEGATIVE
LEUKOCYTES UA: NEGATIVE
Nitrite: NEGATIVE
PROTEIN: NEGATIVE
SPECIFIC GRAVITY, URINE: 1.01 (ref 1.001–1.03)
pH: 7.5 (ref 5.0–8.0)

## 2018-04-10 LAB — MICROALBUMIN / CREATININE URINE RATIO
Creatinine, Urine: 17 mg/dL — ABNORMAL LOW (ref 20–320)
MICROALB/CREAT RATIO: 112 ug/mg{creat} — AB (ref <30)
Microalb, Ur: 1.9 mg/dL

## 2018-04-10 LAB — HEMOGLOBIN A1C
EAG (MMOL/L): 6 (calc)
HEMOGLOBIN A1C: 5.4 %{Hb} (ref <5.7)
MEAN PLASMA GLUCOSE: 108 (calc)

## 2018-04-10 LAB — TSH: TSH: 0.84 mIU/L (ref 0.40–4.50)

## 2018-04-10 LAB — MAGNESIUM: Magnesium: 2 mg/dL (ref 1.5–2.5)

## 2018-04-10 LAB — VITAMIN D 25 HYDROXY (VIT D DEFICIENCY, FRACTURES): Vit D, 25-Hydroxy: 54 ng/mL (ref 30–100)

## 2018-04-10 LAB — PSA: PSA: 2.6 ng/mL (ref <=4.0)

## 2018-04-10 LAB — INSULIN, RANDOM: Insulin: 7.8 u[IU]/mL (ref 2.0–19.6)

## 2018-04-14 ENCOUNTER — Encounter: Payer: Self-pay | Admitting: Internal Medicine

## 2018-05-07 ENCOUNTER — Other Ambulatory Visit: Payer: Self-pay

## 2018-05-07 DIAGNOSIS — Z1211 Encounter for screening for malignant neoplasm of colon: Secondary | ICD-10-CM

## 2018-05-07 DIAGNOSIS — Z1212 Encounter for screening for malignant neoplasm of rectum: Secondary | ICD-10-CM | POA: Diagnosis not present

## 2018-05-07 LAB — POC HEMOCCULT BLD/STL (HOME/3-CARD/SCREEN)
Card #2 Fecal Occult Blod, POC: NEGATIVE
Card #3 Fecal Occult Blood, POC: NEGATIVE
Fecal Occult Blood, POC: NEGATIVE

## 2018-05-18 ENCOUNTER — Inpatient Hospital Stay (HOSPITAL_COMMUNITY)
Admission: EM | Admit: 2018-05-18 | Discharge: 2018-05-22 | DRG: 493 | Disposition: A | Discharge status: Skilled Nursing Facility | Payer: Medicare Other | Attending: Oncology | Admitting: Oncology

## 2018-05-18 ENCOUNTER — Encounter (HOSPITAL_COMMUNITY): Payer: Self-pay

## 2018-05-18 ENCOUNTER — Other Ambulatory Visit: Payer: Self-pay

## 2018-05-18 ENCOUNTER — Emergency Department (HOSPITAL_COMMUNITY): Payer: Medicare Other

## 2018-05-18 DIAGNOSIS — S82042D Displaced comminuted fracture of left patella, subsequent encounter for closed fracture with routine healing: Secondary | ICD-10-CM | POA: Diagnosis not present

## 2018-05-18 DIAGNOSIS — R2689 Other abnormalities of gait and mobility: Secondary | ICD-10-CM | POA: Diagnosis not present

## 2018-05-18 DIAGNOSIS — S8991XA Unspecified injury of right lower leg, initial encounter: Secondary | ICD-10-CM | POA: Diagnosis not present

## 2018-05-18 DIAGNOSIS — M4800 Spinal stenosis, site unspecified: Secondary | ICD-10-CM | POA: Diagnosis present

## 2018-05-18 DIAGNOSIS — M159 Polyosteoarthritis, unspecified: Secondary | ICD-10-CM | POA: Diagnosis not present

## 2018-05-18 DIAGNOSIS — S82002A Unspecified fracture of left patella, initial encounter for closed fracture: Secondary | ICD-10-CM | POA: Diagnosis present

## 2018-05-18 DIAGNOSIS — R609 Edema, unspecified: Secondary | ICD-10-CM | POA: Diagnosis not present

## 2018-05-18 DIAGNOSIS — Z9181 History of falling: Secondary | ICD-10-CM | POA: Diagnosis not present

## 2018-05-18 DIAGNOSIS — E559 Vitamin D deficiency, unspecified: Secondary | ICD-10-CM | POA: Diagnosis present

## 2018-05-18 DIAGNOSIS — R7303 Prediabetes: Secondary | ICD-10-CM | POA: Diagnosis present

## 2018-05-18 DIAGNOSIS — R531 Weakness: Secondary | ICD-10-CM | POA: Diagnosis not present

## 2018-05-18 DIAGNOSIS — E785 Hyperlipidemia, unspecified: Secondary | ICD-10-CM | POA: Diagnosis present

## 2018-05-18 DIAGNOSIS — M25561 Pain in right knee: Secondary | ICD-10-CM | POA: Diagnosis not present

## 2018-05-18 DIAGNOSIS — S82042A Displaced comminuted fracture of left patella, initial encounter for closed fracture: Secondary | ICD-10-CM | POA: Diagnosis present

## 2018-05-18 DIAGNOSIS — Z791 Long term (current) use of non-steroidal anti-inflammatories (NSAID): Secondary | ICD-10-CM

## 2018-05-18 DIAGNOSIS — I959 Hypotension, unspecified: Secondary | ICD-10-CM | POA: Diagnosis not present

## 2018-05-18 DIAGNOSIS — S82451A Displaced comminuted fracture of shaft of right fibula, initial encounter for closed fracture: Secondary | ICD-10-CM | POA: Diagnosis present

## 2018-05-18 DIAGNOSIS — K219 Gastro-esophageal reflux disease without esophagitis: Secondary | ICD-10-CM | POA: Diagnosis present

## 2018-05-18 DIAGNOSIS — Z8249 Family history of ischemic heart disease and other diseases of the circulatory system: Secondary | ICD-10-CM | POA: Diagnosis not present

## 2018-05-18 DIAGNOSIS — S79911A Unspecified injury of right hip, initial encounter: Secondary | ICD-10-CM | POA: Diagnosis not present

## 2018-05-18 DIAGNOSIS — S82401D Unspecified fracture of shaft of right fibula, subsequent encounter for closed fracture with routine healing: Secondary | ICD-10-CM | POA: Diagnosis not present

## 2018-05-18 DIAGNOSIS — M170 Bilateral primary osteoarthritis of knee: Secondary | ICD-10-CM | POA: Diagnosis present

## 2018-05-18 DIAGNOSIS — S82221A Displaced transverse fracture of shaft of right tibia, initial encounter for closed fracture: Principal | ICD-10-CM | POA: Diagnosis present

## 2018-05-18 DIAGNOSIS — S82101A Unspecified fracture of upper end of right tibia, initial encounter for closed fracture: Secondary | ICD-10-CM

## 2018-05-18 DIAGNOSIS — M109 Gout, unspecified: Secondary | ICD-10-CM | POA: Diagnosis not present

## 2018-05-18 DIAGNOSIS — S82831A Other fracture of upper and lower end of right fibula, initial encounter for closed fracture: Secondary | ICD-10-CM | POA: Diagnosis not present

## 2018-05-18 DIAGNOSIS — Z4789 Encounter for other orthopedic aftercare: Secondary | ICD-10-CM | POA: Diagnosis not present

## 2018-05-18 DIAGNOSIS — Z79899 Other long term (current) drug therapy: Secondary | ICD-10-CM | POA: Diagnosis not present

## 2018-05-18 DIAGNOSIS — G8918 Other acute postprocedural pain: Secondary | ICD-10-CM | POA: Diagnosis not present

## 2018-05-18 DIAGNOSIS — W19XXXA Unspecified fall, initial encounter: Secondary | ICD-10-CM

## 2018-05-18 DIAGNOSIS — Z7401 Bed confinement status: Secondary | ICD-10-CM | POA: Diagnosis not present

## 2018-05-18 DIAGNOSIS — S82251A Displaced comminuted fracture of shaft of right tibia, initial encounter for closed fracture: Secondary | ICD-10-CM | POA: Diagnosis not present

## 2018-05-18 DIAGNOSIS — M2550 Pain in unspecified joint: Secondary | ICD-10-CM | POA: Diagnosis not present

## 2018-05-18 DIAGNOSIS — Z7901 Long term (current) use of anticoagulants: Secondary | ICD-10-CM | POA: Diagnosis not present

## 2018-05-18 DIAGNOSIS — S82201A Unspecified fracture of shaft of right tibia, initial encounter for closed fracture: Secondary | ICD-10-CM | POA: Diagnosis not present

## 2018-05-18 DIAGNOSIS — I480 Paroxysmal atrial fibrillation: Secondary | ICD-10-CM | POA: Diagnosis present

## 2018-05-18 DIAGNOSIS — S82002D Unspecified fracture of left patella, subsequent encounter for closed fracture with routine healing: Secondary | ICD-10-CM | POA: Diagnosis not present

## 2018-05-18 DIAGNOSIS — Z23 Encounter for immunization: Secondary | ICD-10-CM

## 2018-05-18 DIAGNOSIS — W1839XA Other fall on same level, initial encounter: Secondary | ICD-10-CM | POA: Diagnosis present

## 2018-05-18 DIAGNOSIS — F4310 Post-traumatic stress disorder, unspecified: Secondary | ICD-10-CM | POA: Diagnosis present

## 2018-05-18 DIAGNOSIS — G894 Chronic pain syndrome: Secondary | ICD-10-CM | POA: Diagnosis present

## 2018-05-18 DIAGNOSIS — Y92009 Unspecified place in unspecified non-institutional (private) residence as the place of occurrence of the external cause: Secondary | ICD-10-CM | POA: Diagnosis not present

## 2018-05-18 DIAGNOSIS — S82101D Unspecified fracture of upper end of right tibia, subsequent encounter for closed fracture with routine healing: Secondary | ICD-10-CM | POA: Diagnosis not present

## 2018-05-18 DIAGNOSIS — W19XXXD Unspecified fall, subsequent encounter: Secondary | ICD-10-CM | POA: Diagnosis not present

## 2018-05-18 DIAGNOSIS — S79912A Unspecified injury of left hip, initial encounter: Secondary | ICD-10-CM | POA: Diagnosis not present

## 2018-05-18 DIAGNOSIS — Z7982 Long term (current) use of aspirin: Secondary | ICD-10-CM | POA: Diagnosis not present

## 2018-05-18 DIAGNOSIS — F329 Major depressive disorder, single episode, unspecified: Secondary | ICD-10-CM | POA: Diagnosis not present

## 2018-05-18 DIAGNOSIS — S80919A Unspecified superficial injury of unspecified knee, initial encounter: Secondary | ICD-10-CM | POA: Diagnosis not present

## 2018-05-18 DIAGNOSIS — M7989 Other specified soft tissue disorders: Secondary | ICD-10-CM | POA: Diagnosis not present

## 2018-05-18 DIAGNOSIS — M48062 Spinal stenosis, lumbar region with neurogenic claudication: Secondary | ICD-10-CM | POA: Diagnosis not present

## 2018-05-18 DIAGNOSIS — S82191A Other fracture of upper end of right tibia, initial encounter for closed fracture: Secondary | ICD-10-CM | POA: Diagnosis not present

## 2018-05-18 DIAGNOSIS — I4891 Unspecified atrial fibrillation: Secondary | ICD-10-CM | POA: Diagnosis not present

## 2018-05-18 DIAGNOSIS — S82209A Unspecified fracture of shaft of unspecified tibia, initial encounter for closed fracture: Secondary | ICD-10-CM

## 2018-05-18 DIAGNOSIS — S8992XA Unspecified injury of left lower leg, initial encounter: Secondary | ICD-10-CM | POA: Diagnosis not present

## 2018-05-18 DIAGNOSIS — I10 Essential (primary) hypertension: Secondary | ICD-10-CM | POA: Diagnosis present

## 2018-05-18 DIAGNOSIS — N400 Enlarged prostate without lower urinary tract symptoms: Secondary | ICD-10-CM | POA: Diagnosis present

## 2018-05-18 DIAGNOSIS — D649 Anemia, unspecified: Secondary | ICD-10-CM | POA: Diagnosis not present

## 2018-05-18 DIAGNOSIS — D62 Acute posthemorrhagic anemia: Secondary | ICD-10-CM

## 2018-05-18 DIAGNOSIS — I482 Chronic atrial fibrillation: Secondary | ICD-10-CM | POA: Diagnosis not present

## 2018-05-18 DIAGNOSIS — M25562 Pain in left knee: Secondary | ICD-10-CM | POA: Diagnosis not present

## 2018-05-18 DIAGNOSIS — N401 Enlarged prostate with lower urinary tract symptoms: Secondary | ICD-10-CM | POA: Diagnosis not present

## 2018-05-18 DIAGNOSIS — S82401A Unspecified fracture of shaft of right fibula, initial encounter for closed fracture: Secondary | ICD-10-CM | POA: Diagnosis not present

## 2018-05-18 LAB — GLUCOSE, CAPILLARY: GLUCOSE-CAPILLARY: 104 mg/dL — AB (ref 70–99)

## 2018-05-18 MED ORDER — ENOXAPARIN SODIUM 40 MG/0.4ML ~~LOC~~ SOLN
40.0000 mg | SUBCUTANEOUS | Status: DC
  Filled 2018-05-18: qty 0.4

## 2018-05-18 MED ORDER — MINOXIDIL 10 MG PO TABS: 10.0000 mg | ORAL_TABLET | Freq: Every day | ORAL | Status: DC

## 2018-05-18 MED ORDER — POLYETHYLENE GLYCOL 3350 17 G PO PACK: 17.0000 g | PACK | Freq: Every day | ORAL | Status: DC | PRN

## 2018-05-18 MED ORDER — ACETAMINOPHEN 650 MG RE SUPP: 650.0000 mg | Freq: Four times a day (QID) | RECTAL | Status: DC | PRN

## 2018-05-18 MED ORDER — DOCUSATE SODIUM 50 MG PO CAPS
50.0000 mg | ORAL_CAPSULE | Freq: Two times a day (BID) | ORAL | Status: DC | PRN
  Filled 2018-05-18: qty 1

## 2018-05-18 MED ORDER — MORPHINE SULFATE (PF) 4 MG/ML IV SOLN
4.0000 mg | Freq: Once | INTRAVENOUS | Status: AC
  Administered 2018-05-18: 4 mg via INTRAVENOUS
  Filled 2018-05-18: qty 1

## 2018-05-18 MED ORDER — INFLUENZA VAC SPLIT HIGH-DOSE 0.5 ML IM SUSY
0.5000 mL | PREFILLED_SYRINGE | INTRAMUSCULAR | Status: DC
  Filled 2018-05-18: qty 0.5

## 2018-05-18 MED ORDER — PANTOPRAZOLE SODIUM 40 MG PO TBEC
40.0000 mg | DELAYED_RELEASE_TABLET | Freq: Every day | ORAL | Status: DC
  Administered 2018-05-20 – 2018-05-22 (×3): 40 mg via ORAL
  Filled 2018-05-18 (×4): qty 1

## 2018-05-18 MED ORDER — KETOROLAC TROMETHAMINE 15 MG/ML IJ SOLN
15.0000 mg | Freq: Four times a day (QID) | INTRAMUSCULAR | Status: DC | PRN
  Administered 2018-05-19: 15 mg via INTRAVENOUS
  Filled 2018-05-18: qty 1

## 2018-05-18 MED ORDER — LISINOPRIL 40 MG PO TABS: 40.0000 mg | ORAL_TABLET | Freq: Every day | ORAL | Status: DC

## 2018-05-18 MED ORDER — DILTIAZEM HCL ER COATED BEADS 240 MG PO CP24: 240.0000 mg | ORAL_CAPSULE | Freq: Every day | ORAL | Status: DC

## 2018-05-18 MED ORDER — HYDROCODONE-ACETAMINOPHEN 5-325 MG PO TABS
1.0000 | ORAL_TABLET | Freq: Once | ORAL | Status: AC
  Administered 2018-05-18: 1 via ORAL
  Filled 2018-05-18: qty 1

## 2018-05-18 MED ORDER — ACETAMINOPHEN 325 MG PO TABS: 650.0000 mg | ORAL_TABLET | Freq: Four times a day (QID) | ORAL | Status: DC | PRN

## 2018-05-18 MED ORDER — HYDROCODONE-ACETAMINOPHEN 5-325 MG PO TABS
1.0000 | ORAL_TABLET | Freq: Three times a day (TID) | ORAL | Status: DC | PRN
  Administered 2018-05-18: 2 via ORAL
  Administered 2018-05-19: 1 via ORAL
  Filled 2018-05-18: qty 1
  Filled 2018-05-18: qty 2

## 2018-05-18 MED ORDER — DOXAZOSIN MESYLATE 8 MG PO TABS
8.0000 mg | ORAL_TABLET | Freq: Every day | ORAL | Status: DC
  Administered 2018-05-18: 8 mg via ORAL
  Filled 2018-05-18: qty 1

## 2018-05-18 MED ORDER — SERTRALINE HCL 100 MG PO TABS: 100.0000 mg | ORAL_TABLET | Freq: Every day | ORAL | Status: DC

## 2018-05-18 NOTE — ED Notes (Signed)
Attempted to call report x2

## 2018-05-18 NOTE — ED Triage Notes (Signed)
Pt brought in by [facility] from home for a mechanical fall in the bathroom. Pt daughter states he got a few steps in to the bathroom when his "legs gave out". Pt denies LOC, denies head neck or back pain. Pt A+Ox4, in NAD on arrival. Pt c/o right leg pain and left knee pain.

## 2018-05-18 NOTE — ED Provider Notes (Signed)
[facility] EMERGENCY DEPARTMENT Provider Note   CSN: [id number] Arrival date & time: 05/18/18  1639   History   Chief Complaint Chief Complaint  Patient presents with  . Fall  . Leg Pain    HPI Tracy Brandt is a 84 y.o. male with a past medical history significant for HTN, Afib not on anticoagulation, DJD of bilateral knees, chronic pain syndrome who presents for evaluation after mechanical fall at home today. States he went to use the bathroom and his knees "gave out on me." Patient states that he landed on his knees and rolled into his bottom. Patient and family state that he has been see for his knee instability previously with orthopedics and was told he had DJD. Admits to bilateral knee pain and blower extremity pain. Denies hitting head, LOC, neck pain,back pain, pelvic pain, hip pain, bowel or bladder incontinence, saddle paresthesias, hx malignancy, numbness or tingling in his extremities, abdominal pain. HPI  Past Medical History:  Diagnosis Date  . Arthritis   . BPH (benign prostatic hyperplasia)   . GERD (gastroesophageal reflux disease)   . Gout   . Hyperlipidemia   . Hypertension   . Prediabetes   . Vitamin D deficiency     Patient Active Problem List   Diagnosis Date Noted  . Hyperlipidemia, mixed 04/09/2018  . Atrial fibrillation ([place]) 01/31/2016  . Anticoagulation therapy not indicated 01/31/2016  . Multifactorial gait disorder 01/31/2016  . GERD (gastroesophageal reflux disease) 01/31/2016  . BPH with obstruction/lower urinary tract symptoms 01/31/2016  . DJD (degenerative joint disease) of knee 01/31/2016  . Chronic pain syndrome 01/31/2016  . Medication management 07/21/2015  . Depression, controlled 02/02/2015  . Spinal stenosis, lumbar region, with neurogenic claudication 09/17/2014  . BMI 25.0-25.9,adult 06/08/2014  . Essential hypertension   . Dyslipidemia   . Abnormal glucose   . Vitamin D deficiency   . Gout     Past  Surgical History:  Procedure Laterality Date  . BACK SURGERY    . EYE SURGERY    . HEMORRHOID SURGERY    . LEG SURGERY          Home Medications    Prior to Admission medications   Medication Sig Start Date End Date Taking? Authorizing Provider  bisoprolol-hydrochlorothiazide [facility] 2) 10-6.25 MG tablet Take 1/2 to 1 tablet daily for BP 08/16/16   Unk Pinto, MD  Cholecalciferol (VITAMIN D PO) Take 2,000 Units by mouth daily.     [provider]  diltiazem (CARDIZEM CD) 180 MG 24 hr capsule Take 1 capsule (180 mg total) by mouth daily. 04/29/15   Skeet Latch, MD  doxazosin (CARDURA) 8 MG tablet Take 8 mg by mouth at bedtime.    [provider]  fish oil-omega-3 fatty acids 1000 MG capsule Take 1 g by mouth daily.    [provider]  furosemide (LASIX) 40 MG tablet Take 1 tablet (40 mg total) by mouth daily. 10/02/16 10/02/17  Forcucci, Courtney, PA-C  lisinopril (PRINIVIL,ZESTRIL) 40 MG tablet TAKE 1 TAB IN THE MORNING AND 1 TAB IN THE EVENING OR AS DIRECTED. 12/27/16   Unk Pinto, MD  Magnesium 250 MG TABS Take 250 mg by mouth 2 (two) times daily.     [provider]  minoxidil (LONITEN) 10 MG tablet Take 1 tablet daily for BP 09/26/17   Unk Pinto, MD  ondansetron (ZOFRAN) 4 MG tablet Take 1 tablet (4 mg total) by mouth every 6 (six) hours as needed. 12/03/12  Isaac Bliss, Rayford Halsted, MD  prochlorperazine (COMPAZINE) 5 MG tablet Take 1 tablet 3 to 4 x/day if needed for Nausea /Vomitting 08/25/16   Unk Pinto, MD  sertraline (ZOLOFT) 100 MG tablet Take 100 mg by mouth daily.    [provider]  traMADol (ULTRAM) 50 MG tablet Take by mouth every 6 (six) hours as needed.    [provider]    Family History Family History  Problem Relation Age of Onset  . Heart disease Mother   . Hypertension Mother   . Heart disease Father   . Cancer Neg Hx   . Diabetes Neg Hx   . Stroke Neg Hx     Social  History Social History   Tobacco Use  . Smoking status: Never Smoker  . Smokeless tobacco: Never Used  Substance Use Topics  . Alcohol use: No    Alcohol/week: 0.0 standard drinks  . Drug use: No     Allergies   Patient has no known allergies.   Review of Systems Review of Systems  Constitutional: Negative for activity change, appetite change, chills, diaphoresis, fatigue, fever and unexpected weight change.  Respiratory: Negative.   Cardiovascular: Negative.   Gastrointestinal: Negative.   Genitourinary: Negative.   Musculoskeletal:       Admits to bilateral lower extremity pain, knee distally.  Skin:       Chronic bilateral lower extremity edema.  Neurological: Negative for dizziness, facial asymmetry, weakness, light-headedness, numbness and headaches.     Physical Exam Updated Vital Signs BP (!) 149/70   Pulse 82   Temp 98.1 F (36.7 C) (Oral)   Resp 18   Ht 5\' 11"  (1.803 m)   Wt 83.9 kg   SpO2 100%   BMI 25.80 kg/m   Physical Exam  Constitutional: He appears well-developed and well-nourished. No distress.  HENT:  Head: Atraumatic.  Eyes: Pupils are equal, round, and reactive to light.  Neck: Normal range of motion. Neck supple.  No midline neck tenderness.  No step-offs.  Mild left trapezius tenderness to palpation.  Per family this is chronic pain, and has been evaluated by orthopedics for this.  No neck rigidity or decreased range of motion.  Cardiovascular: Normal rate, regular rhythm and intact distal pulses. Exam reveals no gallop and no friction rub.  Lower extremity pulses intact.  Pulmonary/Chest: Effort normal and breath sounds normal. No stridor. No respiratory distress. He has no wheezes. He has no rales. He exhibits no tenderness.  Abdominal: Soft. Bowel sounds are normal. He exhibits no distension and no mass. There is no tenderness. There is no rebound and no guarding. No hernia.  Musculoskeletal: Normal range of motion.  1+ bilateral lower  extremity edema. Able to straight leg raise off bed. Tenderness to palpation to bilateral knee and tib/fib, worse on right. No gross deformity. No leg shortening or abnormal rotation. No tenderness to pelvis or bilateral hips.  No midline tenderness to spine.  Neurological: He is alert. He has normal strength. No sensory deficit.  Intact to sharp and dull sensation to bilateral lower extremity.  Skin: Skin is warm and dry. He is not diaphoretic.  No ecchymosis, erythema, warmth.  Psychiatric: He has a normal mood and affect.  Nursing note and vitals reviewed.    ED Treatments / Results  Labs (all labs ordered are listed, but only abnormal results are displayed) Labs Reviewed - No data to display  EKG None  Radiology Dg Tibia/fibula Left  Result Date: 05/18/2018 CLINICAL  DATA:  Fall, bilateral knee pain EXAM: LEFT TIBIA AND FIBULA - 2 VIEW COMPARISON:  None. FINDINGS: Minimally displaced inferior patellar fracture on the lateral view. Tibia and fibula are intact. Knee joint spaces are preserved. Mild prepatellar soft tissue swelling. IMPRESSION: Minimally displaced inferior patellar fracture on the lateral view. Electronically Signed   By: Julian Hy M.D.   On: 05/18/2018 18:59   Dg Tibia/fibula Right  Result Date: 05/18/2018 CLINICAL DATA:  Fall, bilateral knee pain EXAM: RIGHT TIBIA AND FIBULA - 2 VIEW COMPARISON:  None. FINDINGS: Transverse proximal tibial shaft fracture, nondisplaced. Mildly comminuted fibular shaft fracture, with mild apex posterior angulation and minimal displacement. Distal fibula/lateral malleolus is mildly irregular, although this appearance favors a chronic deformity. No definite ankle fracture. The visualized soft tissues are unremarkable. IMPRESSION: Proximal tibial and fibular shaft fractures, as above. Electronically Signed   By: Julian Hy M.D.   On: 05/18/2018 18:59   Dg Knee Complete 4 Views Left  Result Date: 05/18/2018 CLINICAL DATA:   Fall, bilateral knee pain EXAM: LEFT KNEE - COMPLETE 4+ VIEW COMPARISON:  None. FINDINGS: Suspected comminuted patellar fracture with mildly displaced inferior fragment on the lateral view. Knee joint spaces are preserved. Moderate suprapatellar knee joint effusion. Mild prepatellar soft tissue swelling. IMPRESSION: Suspected comminuted patellar fracture, as above. Electronically Signed   By: Julian Hy M.D.   On: 05/18/2018 19:03   Dg Knee Complete 4 Views Right  Result Date: 05/18/2018 CLINICAL DATA:  Fall, bilateral knee pain EXAM: RIGHT KNEE - COMPLETE 4+ VIEW COMPARISON:  None. FINDINGS: Transverse proximal tibial shaft fracture. Mildly comminuted fibular shaft fracture with minimal displacement. Mild tricompartmental prominent in the medial compartment, with chondrocalcinosis. No definite suprapatellar knee joint effusion. Changes of the knee, most IMPRESSION: Proximal tibial and fibular shaft fractures, as above. Electronically Signed   By: Julian Hy M.D.   On: 05/18/2018 18:57   Dg Hips Bilat With Pelvis 3-4 Views  Result Date: 05/18/2018 CLINICAL DATA:  Fall, bilateral knee pain EXAM: DG HIP (WITH OR WITHOUT PELVIS) 3-4V BILAT COMPARISON:  None. FINDINGS: No fracture or dislocation is seen. Mild degenerative changes of the right hip. Left hip joint space is preserved. Visualized bony pelvis appears intact. IMPRESSION: No fracture or dislocation is seen. Mild degenerative changes of the right hip. Electronically Signed   By: Julian Hy M.D.   On: 05/18/2018 19:00    Procedures Procedures (including critical care time)  Medications Ordered in ED Medications  HYDROcodone-acetaminophen (NORCO/VICODIN) 5-325 MG per tablet 1 tablet (1 tablet Oral Given 05/18/18 1737)  morphine 4 MG/ML injection 4 mg (4 mg Intravenous Given 05/18/18 1933)     Initial Impression / Assessment and Plan / ED Course  I have reviewed the triage vital signs and the nursing notes as well as past  medical history.  Pertinent labs & imaging results that were available during my care of the patient were reviewed by me and considered in my medical decision making (see chart for details).  84 year old male presents for mechanical fall at home which occurred earlier this afternoon.  Per family patient has a history of knee instability, giving out on him.  Denies hitting head or LOC.  Not on anticoagulation for his atrial fibrillation.  Patient with complaints to pain of bilateral knees and distal extremity.  Neurovascularly intact.  No obvious deformities.  No skin changes.  Patient is able to lift his legs off the bed and has full range of motion with flexion and  extension of knees, however does have pain.  Patient states he does not want anything "strong" for his pain.  Prefers to take his chronic medicine which is Norco.  We will plan to get scans of his hips, pelvis, bilateral knees and tib-fib and reevaluate.  On re-evaluation patient is requesting additional pain medication . Plain film right leg: Transverse proximal tibial shaft fracture. Mildly displaced comminuted fibular shaft fracture with minimal displacement. Plain film left leg: Suspected comminuted patellar fracture with mildly displaced inferior fragment on the lateral view.  Will consult with Ortho.  1930: Consulted with Dr. Percell Miller. Recommends for admission and patient to be seen in the morning. Recommends bilateral knee mobilizers.   1936: Will consult with Hospitalist for admission.   1948: Internal Medicine teaching hospitalist agree for admission     Final Clinical Impressions(s) / ED Diagnoses   Final diagnoses:  Closed fracture of proximal end of right tibia, unspecified fracture morphology, initial encounter  Closed fracture of proximal end of right fibula, unspecified fracture morphology, initial encounter  Closed displaced comminuted fracture of left patella, initial encounter  Fall, initial encounter    ED  Discharge Orders    None       Maitland Lesiak A, PA-C 05/18/18 2101    Virgel Manifold, MD 05/20/18 1358

## 2018-05-18 NOTE — H&P (Signed)
Date: 05/18/2018               Patient Name:  Tracy Brandt MRN: [id number]  DOB: [birth year] Age / Sex: 84 y.o., male   PCP: Unk Pinto, MD         Medical Service: Internal Medicine Teaching Service         Attending Physician: Dr. Evette Doffing, Mallie Mussel, *    First Contact: Dr. Koleen Distance  Pager: [phone]  Second Contact: Dr. Berline Lopes Pager: [phone]       After Hours (After 5p/  First Contact Pager: [phone]  weekends / holidays): Second Contact Pager: [phone]   Chief Complaint: mechanical fall  History of Present Illness: Tracy Brandt is an 84yo M with a.fib (not on anticoagulation), arthritis, vit D deficiency, GERD, gout, HTN who presented to the [facility] ED via EMS after having a mechanical fall at home. He lives alone at home with family nearby and a nurse aid that visits 5 days/week for 2hrs at a time. He reports not feeling well for the past 2-3 days but can't report any specific symptoms other than coughing up small amounts of phlegm. He walks with a walker but it doesn't fit inside his bathroom door. He states that when he turned to step away from his walker and step into the bathroom that his knees just gave out and he fell forwards, unable to catch himself. He denies LOC, head trauma, bowel or bladder incontinence, or peripheral numbness. His daughter was in the home and immediately came to assist him. He experienced pain of both knees and lower legs. She and a neighbor were unable to get him off the floor so EMS was called.   He reports a previous history of falls, the last one was 3 months ago. He states that he has lots of issues with his feet, knees, and hips and has had knee injections and foot surgeries. He receives his care at the [place].   At the time of my encounter, he denied any pain but was feeling rather uncomfortable in the bilateral knee mobilizers. He denied sob, chest pain, n/v/d.    Meds:  Current Meds  Medication Sig  . aspirin EC 81 MG tablet  Take 81 mg by mouth daily.  Marland Kitchen diltiazem (CARDIZEM CD) 240 MG 24 hr capsule Take 240 mg by mouth daily.  Marland Kitchen docusate sodium (COLACE) 50 MG capsule Take 50 mg by mouth 2 (two) times daily as needed (to prevent constipation).  . hydrochlorothiazide (HYDRODIURIL) 25 MG tablet Take 12.5 mg by mouth daily.  Marland Kitchen HYDROcodone-acetaminophen (NORCO/VICODIN) 5-325 MG tablet Take 1-2 tablets by mouth 3 (three) times daily as needed (for pain).  Marland Kitchen lisinopril (PRINIVIL,ZESTRIL) 40 MG tablet TAKE 1 TAB IN THE MORNING AND 1 TAB IN THE EVENING OR AS DIRECTED. (Patient taking differently: Take 40 mg by mouth daily. )  . meloxicam (MOBIC) 15 MG tablet Take 15 mg by mouth daily.  . minoxidil (LONITEN) 10 MG tablet Take 1 tablet daily for BP (Patient taking differently: Take 10 mg by mouth daily. )  . omeprazole (PRILOSEC) 20 MG capsule Take 20 mg by mouth daily before breakfast.  . polyethylene glycol powder (GLYCOLAX/MIRALAX) powder Take 17 g by mouth daily as needed for mild constipation or moderate constipation.     Allergies: Allergies as of 05/18/2018  . (No Known Allergies)   Past Medical History:  Diagnosis Date  . Arthritis   . BPH (benign prostatic hyperplasia)   .  GERD (gastroesophageal reflux disease)   . Gout   . Hyperlipidemia   . Hypertension   . Prediabetes   . Vitamin D deficiency     Family History: Mother: heart disease, HTN. Father: heart disease   Social History: Lives at home. Two sons and a daughter visit him often and a nurse aid comes 5 days/week to help with laundry, cleaning, and cooking. He served in the [employer] at age 67 during the [place]. After that, he did "every job you could think of." Denies tobacco, alcohol, or illicit drug use.   Review of Systems: A complete ROS was negative except as per HPI.   Physical Exam: Blood pressure (!) 175/77, pulse 89, temperature 98.2 F (36.8 C), temperature source Oral, resp. rate (!) 22, height 5\' 11"  (1.803 m), weight 83.9 kg, SpO2  99 %. Vitals:   05/18/18 2015 05/18/18 2030 05/18/18 2045 05/18/18 2147  BP: (!) 166/87 (!) 152/81 (!) 170/75 (!) 175/77  Pulse: 99 86 92 89  Resp: 14 18 (!) 23 (!) 22  Temp:    98.2 F (36.8 C)  TempSrc:    Oral  SpO2: 98% 98% 98% 99%  Weight:      Height:       General: Vital signs reviewed.  Patient is well-developed and well-nourished, in no acute distress and cooperative with exam.  Head: Normocephalic and atraumatic. Eyes: EOMI, conjunctivae normal, no scleral icterus.  PERRL Neck: Supple, trachea midline, normal ROM, no JVD, masses, thyromegaly, bilateral carotid bruits present.  Cardiovascular: regular rate, irregularly irregular rhythm, S1 normal, S2 normal, no murmurs, gallops, or rubs. Pulmonary/Chest: Difficult to assess given patients limited ROM with bilateral knee mobilizers on but Clear to auscultation bilaterally, no wheezes, rales, or rhonchi. Abdominal: Soft, non-tender, non-distended, BS +, no masses, organomegaly, or guarding present.  Extremities: Bilateral knee mobilizers in place. Toes are cold but DP pulses are strong and symmetric bilaterally. Able to move toes bilaterally. Small amount of blood on right great toe with painful active ROM.  Neurological: A&O x3, Strength is normal and symmetric bilaterally, cranial nerve II-XII are grossly intact, no focal motor deficit, sensory intact to light touch bilaterally.  Skin: Warm, dry and intact. No rashes or erythema. Psychiatric: Normal mood and affect. speech and behavior is normal. Cognition and memory are normal.   Labs: none   Imaging: Left Tibia/fibular x-ray: Tibia and fibula are intact.  Right Tib/Fib x-ray:  - Transverse proximal tibial shaft fracture, nondisplaced. - Mildly comminuted fibular shaft fracture, with mild apex posterior angulation and minimal displacement. - Distal fibula/lateral malleolus is mildly irregular, although this appearance favors a chronic deformity. No definite ankle  fracture.  Knee x-ray: Suspected comminuted left patellar fracture  Hip x-ray: negative for fracture or dislocation  Assessment & Plan by Problem: Active Problems:   Essential hypertension   Vitamin D deficiency   Anticoagulation therapy not indicated   Multifactorial gait disorder   GERD (gastroesophageal reflux disease)   BPH with obstruction/lower urinary tract symptoms   DJD (degenerative joint disease) of knee   Chronic pain syndrome   Closed right tibial fracture  Mr. Galindo is an 84yo M with a.fib (not on anticoagulation), arthritis, vit D deficiency, GERD, gout, HTN who presents with left patellar fracture, right proximal tibial and fibular shaft fractures sustained from a mechanical fall at home.   Closed fractures, left patellar, right proximal tibial and fibular shaft: Sustained from mechanical fall at home when transitioning away from walker into the bathroom.  Low concern for seizure or LOC as patient's daughter was in the home when the fall occurred and did not witness any bowel or bladder incontinence or head trauma. Mr. Parenteau remembers every moment before, during, and after the fall.  - Ortho consulted - bilateral knee mobilizers in place - NPO at midnight for possible surgery - holding home aspirin  - pain control with home Norco and prn toradol  A. Fib: in a. Fib on admission, rate controlled. Denied sob or chest pain. Not on chronic anticoagulation. Denies history of stroke.  - home diltiazem   Vit D def: last checked in Aug 2019 was 86. Reports taking Vit D supplement at home  Arthritis: hips and knees. H/o steroid injections. On chronic opiate medication at home.  - continue home Norco as above   GERD: Continue home Protonix. Question concern for osteoporosis as a side effect on long term PPI use.  PTSD: served in the [place] War at age 3.  - Previously on Zoloft but per pharmacy, patient no longer takes it.   HTN: Hypertensive on admission. Denies  headache, chest pain, sob. Likely elevated due to pain and anxiety.  - Continue home meds (lisinopril, minoxidil)  BPH: continue home doxazosin    DVT ppx: holding Lovenox for possible surgery in the morning   Dispo: Admit patient to Inpatient with expected length of stay greater than 2 midnights.  Signed: Isabelle Course, MD 05/18/2018, 10:48 PM  Pager: [phone]

## 2018-05-18 NOTE — ED Notes (Signed)
Patient transported to X-ray 

## 2018-05-18 NOTE — ED Notes (Addendum)
Attempted to call report x 1  

## 2018-05-19 ENCOUNTER — Inpatient Hospital Stay (HOSPITAL_COMMUNITY): Payer: Medicare Other | Admitting: Anesthesiology

## 2018-05-19 ENCOUNTER — Encounter (HOSPITAL_COMMUNITY)
Admission: EM | Disposition: A | Discharge status: Skilled Nursing Facility | Payer: Self-pay | Source: Home / Self Care | Attending: Student in an Organized Health Care Education/Training Program

## 2018-05-19 ENCOUNTER — Inpatient Hospital Stay (HOSPITAL_COMMUNITY): Payer: Medicare Other

## 2018-05-19 DIAGNOSIS — N400 Enlarged prostate without lower urinary tract symptoms: Secondary | ICD-10-CM

## 2018-05-19 DIAGNOSIS — M159 Polyosteoarthritis, unspecified: Secondary | ICD-10-CM

## 2018-05-19 DIAGNOSIS — M4800 Spinal stenosis, site unspecified: Secondary | ICD-10-CM

## 2018-05-19 DIAGNOSIS — Z79891 Long term (current) use of opiate analgesic: Secondary | ICD-10-CM

## 2018-05-19 DIAGNOSIS — F4310 Post-traumatic stress disorder, unspecified: Secondary | ICD-10-CM

## 2018-05-19 DIAGNOSIS — E559 Vitamin D deficiency, unspecified: Secondary | ICD-10-CM

## 2018-05-19 DIAGNOSIS — I959 Hypotension, unspecified: Secondary | ICD-10-CM

## 2018-05-19 DIAGNOSIS — K219 Gastro-esophageal reflux disease without esophagitis: Secondary | ICD-10-CM

## 2018-05-19 DIAGNOSIS — M109 Gout, unspecified: Secondary | ICD-10-CM

## 2018-05-19 DIAGNOSIS — I480 Paroxysmal atrial fibrillation: Secondary | ICD-10-CM

## 2018-05-19 DIAGNOSIS — D649 Anemia, unspecified: Secondary | ICD-10-CM

## 2018-05-19 DIAGNOSIS — R2689 Other abnormalities of gait and mobility: Secondary | ICD-10-CM

## 2018-05-19 DIAGNOSIS — Z8249 Family history of ischemic heart disease and other diseases of the circulatory system: Secondary | ICD-10-CM

## 2018-05-19 DIAGNOSIS — W1839XA Other fall on same level, initial encounter: Secondary | ICD-10-CM

## 2018-05-19 DIAGNOSIS — Z7982 Long term (current) use of aspirin: Secondary | ICD-10-CM

## 2018-05-19 DIAGNOSIS — S82401A Unspecified fracture of shaft of right fibula, initial encounter for closed fracture: Secondary | ICD-10-CM

## 2018-05-19 HISTORY — PX: TIBIA IM NAIL INSERTION: SHX2516

## 2018-05-19 LAB — MAGNESIUM: MAGNESIUM: 1.8 mg/dL (ref 1.7–2.4)

## 2018-05-19 LAB — CBC
HCT: 32.9 % — ABNORMAL LOW (ref 39.0–52.0)
HCT: 32.3 % — ABNORMAL LOW (ref 39.0–52.0)
HEMOGLOBIN: 10.6 g/dL — AB (ref 13.0–17.0)
Hemoglobin: 10.6 g/dL — ABNORMAL LOW (ref 13.0–17.0)
MCH: 31.8 pg (ref 26.0–34.0)
MCH: 32.4 pg (ref 26.0–34.0)
MCHC: 32.2 g/dL (ref 30.0–36.0)
MCHC: 32.8 g/dL (ref 30.0–36.0)
MCV: 98.8 fL (ref 78.0–100.0)
MCV: 98.8 fL (ref 78.0–100.0)
PLATELETS: 147 10*3/uL — AB (ref 150–400)
Platelets: 152 10*3/uL (ref 150–400)
RBC: 3.33 MIL/uL — ABNORMAL LOW (ref 4.22–5.81)
RBC: 3.27 MIL/uL — AB (ref 4.22–5.81)
RDW: 11.9 % (ref 11.5–15.5)
RDW: 12 % (ref 11.5–15.5)
WBC: 5.3 10*3/uL (ref 4.0–10.5)
WBC: 4.3 10*3/uL (ref 4.0–10.5)

## 2018-05-19 LAB — TROPONIN I
TROPONIN I: 0.03 ng/mL — AB (ref <0.03)
Troponin I: <0.03 ng/mL (ref <0.03)
Troponin I: 0.04 ng/mL (ref <0.03)

## 2018-05-19 LAB — BASIC METABOLIC PANEL
Anion gap: 8 (ref 5–15)
BUN: 12 mg/dL (ref 8–23)
CHLORIDE: 106 mmol/L (ref 98–111)
CO2: 25 mmol/L (ref 22–32)
Calcium: 8.4 mg/dL — ABNORMAL LOW (ref 8.9–10.3)
Creatinine, Ser: 0.57 mg/dL — ABNORMAL LOW (ref 0.61–1.24)
GFR calc Af Amer: >60 mL/min (ref >60)
GFR calc non Af Amer: >60 mL/min (ref >60)
GLUCOSE: 119 mg/dL — AB (ref 70–99)
POTASSIUM: 3.7 mmol/L (ref 3.5–5.1)
Sodium: 139 mmol/L (ref 135–145)

## 2018-05-19 LAB — MRSA PCR SCREENING: MRSA BY PCR: NEGATIVE

## 2018-05-19 LAB — GLUCOSE, CAPILLARY: GLUCOSE-CAPILLARY: 110 mg/dL — AB (ref 70–99)

## 2018-05-19 LAB — PHOSPHORUS: Phosphorus: 3.3 mg/dL (ref 2.5–4.6)

## 2018-05-19 SURGERY — INSERTION, INTRAMEDULLARY ROD, TIBIA
Anesthesia: Spinal | Site: Leg Lower | Laterality: Right

## 2018-05-19 MED ORDER — METHOCARBAMOL 1000 MG/10ML IJ SOLN
500.0000 mg | Freq: Four times a day (QID) | INTRAVENOUS | Status: DC | PRN
  Filled 2018-05-19: qty 5

## 2018-05-19 MED ORDER — LACTATED RINGERS IV SOLN
INTRAVENOUS | Status: DC | PRN
  Administered 2018-05-19 (×2): via INTRAVENOUS

## 2018-05-19 MED ORDER — ACETAMINOPHEN 325 MG PO TABS: 325.0000 mg | ORAL_TABLET | Freq: Four times a day (QID) | ORAL | Status: DC | PRN

## 2018-05-19 MED ORDER — CEFAZOLIN SODIUM-DEXTROSE 2-4 GM/100ML-% IV SOLN
2.0000 g | Freq: Four times a day (QID) | INTRAVENOUS | Status: AC
  Administered 2018-05-19 – 2018-05-20 (×3): 2 g via INTRAVENOUS
  Filled 2018-05-19 (×3): qty 100

## 2018-05-19 MED ORDER — ONDANSETRON HCL 4 MG/2ML IJ SOLN
INTRAMUSCULAR | Status: DC | PRN
  Administered 2018-05-19: 4 mg via INTRAVENOUS

## 2018-05-19 MED ORDER — BUPIVACAINE IN DEXTROSE 0.75-8.25 % IT SOLN
INTRATHECAL | Status: DC | PRN
  Administered 2018-05-19: 1.6 mL via INTRATHECAL

## 2018-05-19 MED ORDER — POLYETHYLENE GLYCOL 3350 17 G PO PACK: 17.0000 g | PACK | Freq: Every day | ORAL | Status: DC | PRN

## 2018-05-19 MED ORDER — ASPIRIN EC 81 MG PO TBEC
81.0000 mg | DELAYED_RELEASE_TABLET | Freq: Two times a day (BID) | ORAL | Status: DC
  Administered 2018-05-19 – 2018-05-20 (×2): 81 mg via ORAL
  Filled 2018-05-19 (×2): qty 1

## 2018-05-19 MED ORDER — LACTATED RINGERS IV SOLN
INTRAVENOUS | Status: DC
  Administered 2018-05-19: 11:00:00 via INTRAVENOUS

## 2018-05-19 MED ORDER — ACETAMINOPHEN 500 MG PO TABS
1000.0000 mg | ORAL_TABLET | Freq: Once | ORAL | Status: AC
  Administered 2018-05-19: 1000 mg via ORAL

## 2018-05-19 MED ORDER — ACETAMINOPHEN 500 MG PO TABS
500.0000 mg | ORAL_TABLET | Freq: Four times a day (QID) | ORAL | Status: AC
  Administered 2018-05-19 – 2018-05-21 (×6): 500 mg via ORAL
  Filled 2018-05-19 (×7): qty 1

## 2018-05-19 MED ORDER — CEFAZOLIN SODIUM-DEXTROSE 2-4 GM/100ML-% IV SOLN
2.0000 g | INTRAVENOUS | Status: AC
  Administered 2018-05-19: 2 g via INTRAVENOUS
  Filled 2018-05-19: qty 100

## 2018-05-19 MED ORDER — METHOCARBAMOL 500 MG PO TABS
500.0000 mg | ORAL_TABLET | Freq: Four times a day (QID) | ORAL | Status: DC | PRN
  Administered 2018-05-19 – 2018-05-22 (×3): 500 mg via ORAL
  Filled 2018-05-19 (×3): qty 1

## 2018-05-19 MED ORDER — METOCLOPRAMIDE HCL 5 MG PO TABS: 5.0000 mg | ORAL_TABLET | Freq: Three times a day (TID) | ORAL | Status: DC | PRN

## 2018-05-19 MED ORDER — ACETAMINOPHEN 500 MG PO TABS
ORAL_TABLET | ORAL | Status: AC
  Filled 2018-05-19: qty 2

## 2018-05-19 MED ORDER — HYDROCODONE-ACETAMINOPHEN 5-325 MG PO TABS
1.0000 | ORAL_TABLET | Freq: Four times a day (QID) | ORAL | Status: DC | PRN
  Administered 2018-05-19 – 2018-05-21 (×5): 2 via ORAL
  Filled 2018-05-19 (×5): qty 2

## 2018-05-19 MED ORDER — CEFAZOLIN SODIUM-DEXTROSE 2-4 GM/100ML-% IV SOLN
INTRAVENOUS | Status: AC
  Filled 2018-05-19: qty 100

## 2018-05-19 MED ORDER — PROPOFOL 500 MG/50ML IV EMUL
INTRAVENOUS | Status: DC | PRN
  Administered 2018-05-19: 30 ug/kg/min via INTRAVENOUS

## 2018-05-19 MED ORDER — ASPIRIN EC 81 MG PO TBEC: 81.0000 mg | DELAYED_RELEASE_TABLET | Freq: Two times a day (BID) | ORAL | 0 refills | Status: DC

## 2018-05-19 MED ORDER — SODIUM CHLORIDE 0.9 % IV SOLN
INTRAVENOUS | Status: DC | PRN
  Administered 2018-05-19: 50 ug/min via INTRAVENOUS

## 2018-05-19 MED ORDER — KETOROLAC TROMETHAMINE 15 MG/ML IJ SOLN
7.5000 mg | Freq: Four times a day (QID) | INTRAMUSCULAR | Status: AC
  Administered 2018-05-19 – 2018-05-20 (×4): 7.5 mg via INTRAVENOUS
  Filled 2018-05-19 (×3): qty 1

## 2018-05-19 MED ORDER — DOCUSATE SODIUM 100 MG PO CAPS
100.0000 mg | ORAL_CAPSULE | Freq: Two times a day (BID) | ORAL | Status: DC
  Administered 2018-05-19 – 2018-05-22 (×6): 100 mg via ORAL
  Filled 2018-05-19 (×6): qty 1

## 2018-05-19 MED ORDER — HYDROCODONE-ACETAMINOPHEN 5-325 MG PO TABS: 1.0000 | ORAL_TABLET | Freq: Four times a day (QID) | ORAL | 0 refills | Status: AC | PRN

## 2018-05-19 MED ORDER — SODIUM CHLORIDE 0.9 % IV BOLUS
500.0000 mL | Freq: Once | INTRAVENOUS | Status: AC
  Administered 2018-05-19: 500 mL via INTRAVENOUS

## 2018-05-19 MED ORDER — METOCLOPRAMIDE HCL 5 MG/ML IJ SOLN: 5.0000 mg | Freq: Three times a day (TID) | INTRAMUSCULAR | Status: DC | PRN

## 2018-05-19 MED ORDER — ONDANSETRON HCL 4 MG/2ML IJ SOLN: 4.0000 mg | Freq: Four times a day (QID) | INTRAMUSCULAR | Status: DC | PRN

## 2018-05-19 MED ORDER — ENOXAPARIN SODIUM 40 MG/0.4ML ~~LOC~~ SOLN
40.0000 mg | SUBCUTANEOUS | Status: DC
  Administered 2018-05-20: 40 mg via SUBCUTANEOUS
  Filled 2018-05-19: qty 0.4

## 2018-05-19 MED ORDER — 0.9 % SODIUM CHLORIDE (POUR BTL) OPTIME
TOPICAL | Status: DC | PRN
  Administered 2018-05-19: 1000 mL

## 2018-05-19 MED ORDER — MORPHINE SULFATE (PF) 2 MG/ML IV SOLN
0.5000 mg | INTRAVENOUS | Status: DC | PRN
  Administered 2018-05-20 – 2018-05-21 (×4): 1 mg via INTRAVENOUS
  Filled 2018-05-19 (×4): qty 1

## 2018-05-19 MED ORDER — INFLUENZA VAC SPLIT HIGH-DOSE 0.5 ML IM SUSY
0.5000 mL | PREFILLED_SYRINGE | INTRAMUSCULAR | Status: AC
  Administered 2018-05-22: 0.5 mL via INTRAMUSCULAR
  Filled 2018-05-19: qty 0.5

## 2018-05-19 MED ORDER — PROMETHAZINE HCL 25 MG/ML IJ SOLN: 6.2500 mg | INTRAMUSCULAR | Status: DC | PRN

## 2018-05-19 MED ORDER — ONDANSETRON HCL 4 MG PO TABS: 4.0000 mg | ORAL_TABLET | Freq: Four times a day (QID) | ORAL | Status: DC | PRN

## 2018-05-19 MED ORDER — KETOROLAC TROMETHAMINE 15 MG/ML IJ SOLN
INTRAMUSCULAR | Status: AC
  Filled 2018-05-19: qty 1

## 2018-05-19 MED ORDER — EPHEDRINE SULFATE 50 MG/ML IJ SOLN
INTRAMUSCULAR | Status: DC | PRN
  Administered 2018-05-19 (×2): 10 mg via INTRAVENOUS

## 2018-05-19 MED ORDER — FENTANYL CITRATE (PF) 100 MCG/2ML IJ SOLN
INTRAMUSCULAR | Status: DC | PRN
  Administered 2018-05-19: 50 ug via INTRAVENOUS

## 2018-05-19 MED ORDER — FENTANYL CITRATE (PF) 250 MCG/5ML IJ SOLN
INTRAMUSCULAR | Status: AC
  Filled 2018-05-19: qty 5

## 2018-05-19 MED ORDER — HYDROMORPHONE HCL 1 MG/ML IJ SOLN: 0.2500 mg | INTRAMUSCULAR | Status: DC | PRN

## 2018-05-19 MED ORDER — SODIUM CHLORIDE 0.9 % IV SOLN
INTRAVENOUS | Status: DC
  Administered 2018-05-19 – 2018-05-20 (×2): via INTRAVENOUS

## 2018-05-19 SURGICAL SUPPLY — 49 items
BANDAGE ACE 6X5 VEL STRL LF (GAUZE/BANDAGES/DRESSINGS) ×2 IMPLANT
BIT DRILL AO GAMMA 4.2X180 (BIT) ×2 IMPLANT
BIT DRILL AO GAMMA 4.2X340 (BIT) ×2 IMPLANT
BLADE SURG 10 STRL SS (BLADE) ×3 IMPLANT
BNDG GAUZE ELAST 4 BULKY (GAUZE/BANDAGES/DRESSINGS) ×2 IMPLANT
COVER SURGICAL LIGHT HANDLE (MISCELLANEOUS) ×4 IMPLANT
DRAPE C-ARM 42X72 X-RAY (DRAPES) ×3 IMPLANT
DRAPE C-ARMOR (DRAPES) ×2 IMPLANT
DRAPE HALF SHEET 40X57 (DRAPES) ×3 IMPLANT
DRAPE IMP U-DRAPE 54X76 (DRAPES) ×3 IMPLANT
DRSG ADAPTIC 3X8 NADH LF (GAUZE/BANDAGES/DRESSINGS) ×2 IMPLANT
DURAPREP 26ML APPLICATOR (WOUND CARE) ×4 IMPLANT
ELECT REM PT RETURN 9FT ADLT (ELECTROSURGICAL) ×3
ELECTRODE REM PT RTRN 9FT ADLT (ELECTROSURGICAL) ×1 IMPLANT
GAUZE SPONGE 4X4 12PLY STRL (GAUZE/BANDAGES/DRESSINGS) ×2 IMPLANT
GLOVE BIO SURGEON STRL SZ7.5 (GLOVE) ×6 IMPLANT
GLOVE BIOGEL PI IND STRL 8 (GLOVE) ×2 IMPLANT
GLOVE BIOGEL PI INDICATOR 8 (GLOVE) ×4
GOWN STRL REUS W/ TWL LRG LVL3 (GOWN DISPOSABLE) ×3 IMPLANT
GOWN STRL REUS W/TWL LRG LVL3 (GOWN DISPOSABLE) ×9
GUIDEROD T2 3X1000 (ROD) ×2 IMPLANT
GUIDEWIRE GAMMA (WIRE) ×2 IMPLANT
K-WIRE FIXATION 3X285 COATED (WIRE) ×6
KIT BASIN OR (CUSTOM PROCEDURE TRAY) ×3 IMPLANT
KIT TURNOVER KIT B (KITS) ×3 IMPLANT
KWIRE FIXATION 3X285 COATED (WIRE) IMPLANT
NAIL ELAS INSERT SLV SPI 8-11 (MISCELLANEOUS) ×2 IMPLANT
NAIL TIBIA STD 9X330MM (Nail) ×2 IMPLANT
NS IRRIG 1000ML POUR BTL (IV SOLUTION) ×3 IMPLANT
PACK ORTHO EXTREMITY (CUSTOM PROCEDURE TRAY) ×3 IMPLANT
PACK UNIVERSAL I (CUSTOM PROCEDURE TRAY) ×3 IMPLANT
PAD ABD 8X10 STRL (GAUZE/BANDAGES/DRESSINGS) ×6 IMPLANT
PAD ARMBOARD 7.5X6 YLW CONV (MISCELLANEOUS) ×4 IMPLANT
PADDING CAST COTTON 6X4 STRL (CAST SUPPLIES) ×2 IMPLANT
REAMER INTRAMEDULLARY 8MM 510 (MISCELLANEOUS) ×2 IMPLANT
SCREW GAMMA (Screw) ×2 IMPLANT
SCREW LOCK 5X80 FT T2 (Screw) ×4 IMPLANT
SCREW LOCKING T2 F/T  5MMX35MM (Screw) ×2 IMPLANT
SCREW LOCKING T2 F/T 5MMX35MM (Screw) IMPLANT
SPONGE LAP 18X18 X RAY DECT (DISPOSABLE) ×2 IMPLANT
SUT ETHILON 3 0 FSL (SUTURE) ×2 IMPLANT
SUT VIC AB 0 CT1 27 (SUTURE) ×3
SUT VIC AB 0 CT1 27XBRD ANBCTR (SUTURE) ×1 IMPLANT
SYR BULB IRRIGATION 50ML (SYRINGE) ×3 IMPLANT
TOWEL OR 17X24 6PK STRL BLUE (TOWEL DISPOSABLE) ×3 IMPLANT
TOWEL OR 17X26 10 PK STRL BLUE (TOWEL DISPOSABLE) ×3 IMPLANT
TUBE CONNECTING 12'X1/4 (SUCTIONS) ×1
TUBE CONNECTING 12X1/4 (SUCTIONS) ×2 IMPLANT
YANKAUER SUCT BULB TIP NO VENT (SUCTIONS) ×3 IMPLANT

## 2018-05-19 NOTE — Progress Notes (Signed)
Pt has shivers - bair hugger placed on pt

## 2018-05-19 NOTE — Progress Notes (Signed)
Lab called and reported the results of a treponin level 0.03, M.D.will be notified of results.

## 2018-05-19 NOTE — Anesthesia Postprocedure Evaluation (Signed)
Anesthesia Post Note  Patient: Tracy Brandt  Procedure(s) Performed: INTRAMEDULLARY (IM) NAIL TIBIAL (Right Leg Lower)     Patient location during evaluation: PACU Anesthesia Type: Spinal Level of consciousness: awake and alert Pain management: pain level controlled Vital Signs Assessment: post-procedure vital signs reviewed and stable Respiratory status: spontaneous breathing and respiratory function stable Cardiovascular status: blood pressure returned to baseline and stable Postop Assessment: spinal receding Anesthetic complications: no    Last Vitals:  Vitals:   05/19/18 1205 05/19/18 1220  BP: (!) 111/49 109/68  Pulse: 70 90  Resp: 19 16  Temp: (!) 36.3 C   SpO2: 100% 97%    Last Pain:  Vitals:   05/19/18 1235  TempSrc:   PainSc: 0-No pain                 Leaira Fullam DANIEL

## 2018-05-19 NOTE — Progress Notes (Signed)
Paged by nurse that patient was found to be hypotensive, complaining of dizziness and appears clammy and diaphoretic.  Systolic blood pressure was 50s.  Rapid response was called and he was given 500 cc bolus with some improvement in his blood pressure to 98/60. We paged after rapid response left. During evaluation Mr. Zawislak was awake, alert and oriented, lying in bed, appears pale, complaining of mild dizziness and lower extremity pain. Denies any chest pain or shortness of breath. Blood pressure was in 71W systolic with map of 61. EKG was without any acute change. Another 500 cc bolus was given with improvement in his blood pressure to 103/48. Discontinue all of his antihypertensives which include Cardizem, lisinopril, minoxidil and Cardura. Patient got his Cardura around 11 PM. Rest of the meds were scheduled for the morning. Ordered stat CBC, BMP and troponin.

## 2018-05-19 NOTE — Plan of Care (Signed)

## 2018-05-19 NOTE — Significant Event (Addendum)
Rapid Response Event Note Called by Nursing staff for hypotension 81/37  Overview: Time Called: 2359 Arrival Time: 0008 Event Type: Hypotension  Initial Focused Assessment: Tracy Brandt is awake, lying in bed in trendelenburg position.  He is oriented x4 and c/o BLE pain and states he feels terrible. Denies CP and SOB.  Skin is warm, pale and diaphoretic.  BBS CTA.  HR 74 irreg, BP 81/37 Left FA, RR 14 with sats 94% on RA.  NIBP repositioned to Left upper arm and BP 104/44.  BP checked in Right upper arm 74/49.  Rechecked NIBP in LUA and 106/54.  NS 500 cc bolus initiated because pt is symptomatic.  Toradol was given for pain per previous order.  Interventions: -NS bolus 500 cc  Plan of Care (if not transferred): -Monitor BP in Left upper arm -Recheck VS after bolus complete -Notify primary svc of events -notify primary svc and/or RRRN for any further clinical decompensations  Event Summary: Pt stayed in room, stabilized.  HR 72, 98/60 (70), RR 14 sats 95% on RA    Madelynn Done

## 2018-05-19 NOTE — Anesthesia Procedure Notes (Signed)
Spinal  Patient location during procedure: OR Start time: 05/19/2018 10:41 AM End time: 05/19/2018 10:51 AM Staffing Anesthesiologist: Duane Boston, MD Performed: anesthesiologist  Preanesthetic Checklist Completed: patient identified, surgical consent, pre-op evaluation, timeout performed, IV checked, risks and benefits discussed and monitors and equipment checked Spinal Block Patient position: sitting Prep: DuraPrep Patient monitoring: cardiac monitor, continuous pulse ox and blood pressure Approach: midline Location: L2-3 Injection technique: single-shot Needle Needle type: Pencan  Needle gauge: 24 G Needle length: 9 cm Additional Notes Functioning IV was confirmed and monitors were applied. Sterile prep and drape, including hand hygiene and sterile gloves were used. The patient was positioned and the spine was prepped. The skin was anesthetized with lidocaine.  Free flow of clear CSF was obtained prior to injecting local anesthetic into the CSF.  The spinal needle aspirated freely following injection.  The needle was carefully withdrawn.  The patient tolerated the procedure well.

## 2018-05-19 NOTE — Transfer of Care (Signed)
Immediate Anesthesia Transfer of Care Note  Patient: Tracy Brandt  Procedure(s) Performed: INTRAMEDULLARY (IM) NAIL TIBIAL (Right Leg Lower)  Patient Location: PACU  Anesthesia Type:Spinal  Level of Consciousness: awake, alert  and oriented  Airway & Oxygen Therapy: Patient Spontanous Breathing  Post-op Assessment: Report given to RN and Post -op Vital signs reviewed and stable  Post vital signs: Reviewed and stable  Last Vitals:  Vitals Value Taken Time  BP 111/49 05/19/2018 12:05 PM  Temp    Pulse 88 05/19/2018 12:06 PM  Resp 14 05/19/2018 12:06 PM  SpO2 100 % 05/19/2018 12:06 PM  Vitals shown include unvalidated device data.  Last Pain:  Vitals:   05/19/18 0939  TempSrc:   PainSc: 0-No pain         Complications: No apparent anesthesia complications

## 2018-05-19 NOTE — Progress Notes (Signed)
   Subjective: Tracy Brandt was seen and evaluated at bedside on morning rounds. Family present at bedside. Overnight, he became hypotensive to 81/37 with associated diaphoresis and lightheadedness. Responded with 500 cc bolus of NS. On our evaluation, patient was feeling well without any complaints. His pain was well controlled on analgesics. Denies any fevers, chills, chest pain, shortness of breath, DOE, lower extremity edema, numbness/tingling. We discussed possibility of operative versus conservative management depending on what ortho thought was best.    Objective:  Vital signs in last 24 hours: Vitals:   05/19/18 0336 05/19/18 0500 05/19/18 0700 05/19/18 1205  BP: (!) 114/49 (!) 145/47 (!) 143/59 (!) 111/49  Pulse:  69 90 70  Resp:  17 (!) 22 19  Temp:  97.7 F (36.5 C) 98.1 F (36.7 C)   TempSrc:  Oral Oral   SpO2:  95% 98% 100%  Weight:      Height:       General: awake, alert pleasant gentleman lying in bed in NAD CV: Irregularly irregular rhythm; normal rate.  Ext: Bilateral knee immobilizers in place. L knee with suprapatellar effusion, TTP. RLE with mild tenderness to palpation. Both extremities are neurovascularly intact; compartments soft.   Assessment/Plan:  Principal Problem:   Closed right tibial fracture Active Problems:   Essential hypertension   Atrial fibrillation (HCC)   Fall   Left patella fracture  1. Closed right tibial fracture, patellar fracture secondary to mechanical fall at home  - ortho consulted and will place rod in RLE to repair tib/fib fracture  - this will allow for earlier weight bearing and increased likelihood for progressing with rehab - patellar fracture will be treated non-operatively with knee immobilizer and rehab  - follow-up ortho's post-op recommendations  2. HTN: - holding home anti-hypertensives given hypotensive episode overnight - hypotension likely in the setting of paroxysmal afib, new anemia and just receiving his home  anti-hypertensives  - will resume meds as indicated post-op   3. Afib: Rate controlled with diltiazem   Dispo: Anticipated discharge in approximately 2-3 day(s).   Modena Nunnery D, DO 05/19/2018, 12:34 PM Pager: [phone]

## 2018-05-19 NOTE — Plan of Care (Signed)

## 2018-05-19 NOTE — Progress Notes (Signed)
Alerted by nursing technican that patient's blood pressure is low 58/41,got similar reading in both arms.Patient diaphorectic,skin clammy.He is complaining of lightheadiness,denies sob or chestpain.Rapid response nurse call to assess patient.

## 2018-05-19 NOTE — Consult Note (Signed)
ORTHOPAEDIC CONSULTATION  REQUESTING PHYSICIAN: Axel Filler, *  Chief Complaint: Bilateral leg pain  HPI: Tracy Brandt is a 84 y.o. male with a history of A. fib not on anticoagulation, OA, gout, GERD, hypertension who complains of bilateral leg pain after his legs gave out on him at home.  He fell directly onto the front of his knees.  He presented to the emergency department where x-rays showed a minimally displaced comminuted left patella fracture and a nondisplaced proximal tibia and fibula fracture.  Orthopedics was consulted for evaluation.  Overnight rapid response was called for hypotensive episode, diaphoresis which improved with IV fluid therapy.  This morning he is sore but pain is controlled.  He denies any lower extremity numbness or paresthesias.  He was previously living alone with difficult mobilization due to chronic pain from arthritis and gout.  His family states that they doubt he would be able to mobilize if weightbearing were limited on either lower extremity.   Past Medical History:  Diagnosis Date  . Arthritis   . BPH (benign prostatic hyperplasia)   . GERD (gastroesophageal reflux disease)   . Gout   . Hyperlipidemia   . Hypertension   . Prediabetes   . Vitamin D deficiency    Past Surgical History:  Procedure Laterality Date  . BACK SURGERY    . EYE SURGERY    . HEMORRHOID SURGERY    . LEG SURGERY     Social History   Socioeconomic History  . Marital status: Widowed    Spouse name: Not on file  . Number of children: Not on file  . Years of education: Not on file  . Highest education level: Not on file  Occupational History  . Not on file  Social Needs  . Financial resource strain: Not on file  . Food insecurity:    Worry: Not on file    Inability: Not on file  . Transportation needs:    Medical: Not on file    Non-medical: Not on file  Tobacco Use  . Smoking status: Never Smoker  . Smokeless tobacco: Never Used    Substance and Sexual Activity  . Alcohol use: No    Alcohol/week: 0.0 standard drinks  . Drug use: No  . Sexual activity: Not Currently  Lifestyle  . Physical activity:    Days per week: Not on file    Minutes per session: Not on file  . Stress: Not on file  Relationships  . Social connections:    Talks on phone: Not on file    Gets together: Not on file    Attends religious service: Not on file    Active member of club or organization: Not on file    Attends meetings of clubs or organizations: Not on file    Relationship status: Not on file  Other Topics Concern  . Not on file  Social History Narrative  . Not on file   Family History  Problem Relation Age of Onset  . Heart disease Mother   . Hypertension Mother   . Heart disease Father   . Cancer Neg Hx   . Diabetes Neg Hx   . Stroke Neg Hx    No Known Allergies Prior to Admission medications   Medication Sig Start Date End Date Taking? Authorizing Provider  aspirin EC 81 MG tablet Take 81 mg by mouth daily.   Yes [provider]  diltiazem (CARDIZEM CD) 240 MG 24 hr capsule  Take 240 mg by mouth daily.   Yes [provider]  docusate sodium (COLACE) 50 MG capsule Take 50 mg by mouth 2 (two) times daily as needed (to prevent constipation).   Yes [provider]  hydrochlorothiazide (HYDRODIURIL) 25 MG tablet Take 12.5 mg by mouth daily.   Yes [provider]  HYDROcodone-acetaminophen (NORCO/VICODIN) 5-325 MG tablet Take 1-2 tablets by mouth 3 (three) times daily as needed (for pain).   Yes [provider]  lisinopril (PRINIVIL,ZESTRIL) 40 MG tablet TAKE 1 TAB IN THE MORNING AND 1 TAB IN THE EVENING OR AS DIRECTED. Patient taking differently: Take 40 mg by mouth daily.  12/27/16  Yes Unk Pinto, MD  meloxicam (MOBIC) 15 MG tablet Take 15 mg by mouth daily.   Yes [provider]  minoxidil (LONITEN) 10 MG tablet Take 1 tablet daily for BP Patient taking  differently: Take 10 mg by mouth daily.  09/26/17  Yes Unk Pinto, MD  omeprazole (PRILOSEC) 20 MG capsule Take 20 mg by mouth daily before breakfast.   Yes [provider]  polyethylene glycol powder (GLYCOLAX/MIRALAX) powder Take 17 g by mouth daily as needed for mild constipation or moderate constipation.   Yes [provider]  bisoprolol-hydrochlorothiazide [facility]) 10-6.25 MG tablet Take 1/2 to 1 tablet daily for BP Patient not taking: Reported on 05/18/2018 08/16/16   Unk Pinto, MD  diltiazem (CARDIZEM CD) 180 MG 24 hr capsule Take 1 capsule (180 mg total) by mouth daily. Patient not taking: Reported on 05/18/2018 04/29/15   Skeet Latch, MD  doxazosin (CARDURA) 8 MG tablet Take 8 mg by mouth at bedtime.    [provider]  furosemide (LASIX) 40 MG tablet Take 1 tablet (40 mg total) by mouth daily. Patient not taking: Reported on 05/18/2018 10/02/16 05/18/18  Forcucci, Loma Sousa, PA-C  Magnesium 250 MG TABS Take 250 mg by mouth 2 (two) times daily.     [provider]  ondansetron (ZOFRAN) 4 MG tablet Take 1 tablet (4 mg total) by mouth every 6 (six) hours as needed. Patient not taking: Reported on 05/18/2018 12/03/12   Isaac Bliss, Rayford Halsted, MD  prochlorperazine (COMPAZINE) 5 MG tablet Take 1 tablet 3 to 4 x/day if needed for Nausea /Vomitting Patient not taking: Reported on 05/18/2018 08/25/16   Unk Pinto, MD  sertraline (ZOLOFT) 100 MG tablet Take 100 mg by mouth daily.    [provider]   Dg Tibia/fibula Left  Result Date: 05/18/2018 CLINICAL DATA:  Fall, bilateral knee pain EXAM: LEFT TIBIA AND FIBULA - 2 VIEW COMPARISON:  None. FINDINGS: Minimally displaced inferior patellar fracture on the lateral view. Tibia and fibula are intact. Knee joint spaces are preserved. Mild prepatellar soft tissue swelling. IMPRESSION: Minimally displaced inferior patellar fracture on the lateral view. Electronically Signed   By: Julian Hy  M.D.   On: 05/18/2018 18:59   Dg Tibia/fibula Right  Result Date: 05/18/2018 CLINICAL DATA:  Fall, bilateral knee pain EXAM: RIGHT TIBIA AND FIBULA - 2 VIEW COMPARISON:  None. FINDINGS: Transverse proximal tibial shaft fracture, nondisplaced. Mildly comminuted fibular shaft fracture, with mild apex posterior angulation and minimal displacement. Distal fibula/lateral malleolus is mildly irregular, although this appearance favors a chronic deformity. No definite ankle fracture. The visualized soft tissues are unremarkable. IMPRESSION: Proximal tibial and fibular shaft fractures, as above. Electronically Signed   By: Julian Hy M.D.   On: 05/18/2018 18:59   Dg Knee Complete 4 Views Left  Result Date: 05/18/2018 CLINICAL  DATA:  Fall, bilateral knee pain EXAM: LEFT KNEE - COMPLETE 4+ VIEW COMPARISON:  None. FINDINGS: Suspected comminuted patellar fracture with mildly displaced inferior fragment on the lateral view. Knee joint spaces are preserved. Moderate suprapatellar knee joint effusion. Mild prepatellar soft tissue swelling. IMPRESSION: Suspected comminuted patellar fracture, as above. Electronically Signed   By: Julian Hy M.D.   On: 05/18/2018 19:03   Dg Knee Complete 4 Views Right  Result Date: 05/18/2018 CLINICAL DATA:  Fall, bilateral knee pain EXAM: RIGHT KNEE - COMPLETE 4+ VIEW COMPARISON:  None. FINDINGS: Transverse proximal tibial shaft fracture. Mildly comminuted fibular shaft fracture with minimal displacement. Mild tricompartmental prominent in the medial compartment, with chondrocalcinosis. No definite suprapatellar knee joint effusion. Changes of the knee, most IMPRESSION: Proximal tibial and fibular shaft fractures, as above. Electronically Signed   By: Julian Hy M.D.   On: 05/18/2018 18:57   Dg Hips Bilat With Pelvis 3-4 Views  Result Date: 05/18/2018 CLINICAL DATA:  Fall, bilateral knee pain EXAM: DG HIP (WITH OR WITHOUT PELVIS) 3-4V BILAT COMPARISON:  None.  FINDINGS: No fracture or dislocation is seen. Mild degenerative changes of the right hip. Left hip joint space is preserved. Visualized bony pelvis appears intact. IMPRESSION: No fracture or dislocation is seen. Mild degenerative changes of the right hip. Electronically Signed   By: Julian Hy M.D.   On: 05/18/2018 19:00    Positive ROS: All other systems have been reviewed and were otherwise negative with the exception of those mentioned in the HPI and as above.  Objective: Labs cbc Recent Labs    05/19/18 0226  WBC 5.3  HGB 10.6*  HCT 32.9*  PLT 147*    Labs inflam No results for input(s): CRP in the last 72 hours.  Invalid input(s): ESR  Labs coag No results for input(s): INR, PTT in the last 72 hours.  Invalid input(s): PT  Recent Labs    05/19/18 0226  NA 139  K 3.7  CL 106  CO2 25  GLUCOSE 119*  BUN 12  CREATININE 0.57*  CALCIUM 8.4*    Physical Exam: Vitals:   05/19/18 0336 05/19/18 0500  BP: (!) 114/49 (!) 145/47  Pulse:  69  Resp:  17  Temp:  97.7 F (36.5 C)  SpO2:  95%   General: Alert, no acute distress.  Upright in bed on arrival.  Calm, conversant.  Family at bedside. Mental status: Alert and Oriented x3 Neurologic: Speech Clear and organized, no gross focal findings or movement disorder appreciated. Respiratory: No cyanosis, no use of accessory musculature Cardiovascular: Regular rate.  Irregularly irregular rhythm. GI: Abdomen is soft and non-tender, non-distended. Skin: Warm and dry.  No lesions in the area of chief complaint. Extremities: Warm and well perfused w/o edema Psychiatric: Patient is competent for consent with normal mood and affect  MUSCULOSKELETAL:  RLE: Immobilizer in place.  Tenderness about the area of fractures with moderate swelling.  No ecchymosis.  Compartments are soft.  Mild effusion.  He is neurovascularly intact distally.  Dorsiflexion plantarflexion EHL and FHL are intact.  Sensation is intact.  No pain  with passive dorsiflexion/plantarflexion or flexion extension of ankle/great toe. LLE: Immobilizer in place.  Knee with mild swelling.  Minimal tenderness to palpation.  No lesions or ecchymosis.  There is a small, approximately 2 x 1 cm area of the prepatellar region with small fluid collection-this appears to be early noninfected prepatellar bursitis.  Patient voluntarily flexes knee but is not able to lift  leg off of bed and extension.  Dorsiflexion plantarflexion EHL and FHL are intact.  Sensation is intact distally.  Compartments are soft. Other extremities are atraumatic with painless ROM and NVI.  Assessment / Plan: Active Problems:   Essential hypertension   Vitamin D deficiency   Anticoagulation therapy not indicated   Multifactorial gait disorder   GERD (gastroesophageal reflux disease)   BPH with obstruction/lower urinary tract symptoms   DJD (degenerative joint disease) of knee   Chronic pain syndrome   Closed right tibial fracture   Mildly displaced right proximal tibia, fibula fracture This likely would heal in time without surgery, but would prolong his time immobilized and increase risk associated with prolonged immobilization.  Surgery would allow WB much earlier, but introduces the risks associated with surgery.   Mildly displaced and comminuted left patella fracture Non-operative management.   Maintain immobilizer WBAT in immobilizer only No ROM Left knee  Details, risks and benefits discussed at length with the patient and his family and they elect for surgical management of tibia fracture and non-op management of patella fracture.  Case was also discussed between Dr. Alain Marion and the Hospitalist Team who deem him safe to proceed with surgery.   NWB BLE for now.  Will amend postoperatively.  Contact information:  Edmonia Lynch MD, Roxan Hockey PA-C  Prudencio Burly III PA-C 05/19/2018 7:24 AM

## 2018-05-19 NOTE — Op Note (Signed)
05/18/2018 - 05/19/2018  10:25 AM  PATIENT:  Tracy Brandt    PRE-OPERATIVE DIAGNOSIS:  fracture tibia  POST-OPERATIVE DIAGNOSIS:  Same  PROCEDURE:  INTRAMEDULLARY (IM) NAIL TIBIAL  SURGEON:  Renette Butters, MD  ASSISTANT: Roxan Hockey, PA-C, he was present and scrubbed throughout the case, critical for completion in a timely fashion, and for retraction, instrumentation, and closure.   ANESTHESIA:   General  PREOPERATIVE INDICATIONS:  Tracy Brandt is a  84 y.o. male with a diagnosis of fracture tibia who failed conservative measures and elected for surgical management.    The risks benefits and alternatives were discussed with the patient preoperatively including but not limited to the risks of infection, bleeding, nerve injury, cardiopulmonary complications, the need for revision surgery, among others, and the patient was willing to proceed.  OPERATIVE IMPLANTS: Stryker T2 Tibial nail    BLOOD LOSS: minimal  COMPLICATIONS: none  TOURNIQUET TIME: none  OPERATIVE PROCEDURE:  Patient was identified in the preoperative holding area and site was marked by me He was transported to the operating theater and placed on the table in supine position taking care to pad all bony prominences. After a preincinduction time out anesthesia was induced. The right lower extremity was prepped and draped in normal sterile fashion and a pre-incision timeout was performed. Tracy Brandt received ancef for preoperative antibiotics.   The leg was placed over the bone foam. I then made a 4 cm incision over the quad tendon. I dissected down and incised the quad tendon. I placed the suprapatellar sleeve and secured it into place taking care to not damage the patella femoral joint.   I placed a guidewire under fluoroscopic guidance just medial to lateral tibial spine. I was happy with this placement on all views. I used the entry reamer to create a path into the proximal tibia staying out of the  joint itself.  I then passed the ball tip guidewire down the tibia and across the fracture site. I held appropriate reduction confirmed on multiple views of fluoroscopy and sequentially reamed up to an appropriate size with appropriate chatter. I selected the above-sized nail and passed it down the ball-tipped guidewire and seated it completely to a was sunk into the proximal tibia.  I then used the outrigger placed proximal locking screws. I was happy with her length and placement on multiple oblique fluoroscopic views.  Next I confirmed appropriate rotation of the tibia with fracture tease and orientation of the patella and toes. I then used a perfect circles technique to place a distal static interlock screw.  The wounds were then all thoroughly irrigated and closed in layers. Sterile dressing was applied and he was taken to the PACU in stable condition.  POST OPERATIVE PLAN: WBAT, DVT prophylaxis: Early ambulation, SCD's, chemical px

## 2018-05-19 NOTE — Anesthesia Preprocedure Evaluation (Addendum)
Anesthesia Evaluation  Patient identified by MRN, date of birth, ID band Patient awake    Reviewed: Allergy & Precautions, NPO status , Patient's Chart, lab work & pertinent test results  History of Anesthesia Complications Negative for: history of anesthetic complications  Airway Mallampati: I  TM Distance: >3 FB Neck ROM: Full    Dental  (+) Edentulous Upper, Edentulous Lower, Dental Advisory Given   Pulmonary neg pulmonary ROS,    Pulmonary exam normal        Cardiovascular hypertension, Pt. on medications Normal cardiovascular exam+ dysrhythmias Atrial Fibrillation      Neuro/Psych PSYCHIATRIC DISORDERS Depression negative neurological ROS     GI/Hepatic Neg liver ROS, GERD  ,  Endo/Other  negative endocrine ROS  Renal/GU negative Renal ROS     Musculoskeletal  (+) Arthritis , Osteoarthritis,    Abdominal   Peds  Hematology   Anesthesia Other Findings   Reproductive/Obstetrics                            Anesthesia Physical Anesthesia Plan  ASA: III  Anesthesia Plan: Spinal   Post-op Pain Management:    Induction:   PONV Risk Score and Plan: 2 and Ondansetron and Propofol infusion  Airway Management Planned: Simple Face Mask and Natural Airway  Additional Equipment:   Intra-op Plan:   Post-operative Plan:   Informed Consent: I have reviewed the patients History and Physical, chart, labs and discussed the procedure including the risks, benefits and alternatives for the proposed anesthesia with the patient or authorized representative who has indicated his/her understanding and acceptance.     Plan Discussed with: CRNA, Anesthesiologist and Surgeon  Anesthesia Plan Comments:        Anesthesia Quick Evaluation

## 2018-05-20 ENCOUNTER — Encounter (HOSPITAL_COMMUNITY): Payer: Self-pay | Admitting: Orthopedic Surgery

## 2018-05-20 DIAGNOSIS — S82002A Unspecified fracture of left patella, initial encounter for closed fracture: Secondary | ICD-10-CM

## 2018-05-20 DIAGNOSIS — W19XXXA Unspecified fall, initial encounter: Secondary | ICD-10-CM

## 2018-05-20 DIAGNOSIS — R7303 Prediabetes: Secondary | ICD-10-CM

## 2018-05-20 DIAGNOSIS — I482 Chronic atrial fibrillation: Secondary | ICD-10-CM

## 2018-05-20 DIAGNOSIS — I4891 Unspecified atrial fibrillation: Secondary | ICD-10-CM

## 2018-05-20 DIAGNOSIS — I10 Essential (primary) hypertension: Secondary | ICD-10-CM

## 2018-05-20 DIAGNOSIS — S82101A Unspecified fracture of upper end of right tibia, initial encounter for closed fracture: Secondary | ICD-10-CM

## 2018-05-20 DIAGNOSIS — D62 Acute posthemorrhagic anemia: Secondary | ICD-10-CM

## 2018-05-20 DIAGNOSIS — G8918 Other acute postprocedural pain: Secondary | ICD-10-CM

## 2018-05-20 DIAGNOSIS — E785 Hyperlipidemia, unspecified: Secondary | ICD-10-CM

## 2018-05-20 MED ORDER — DILTIAZEM HCL ER COATED BEADS 120 MG PO CP24
120.0000 mg | ORAL_CAPSULE | Freq: Every day | ORAL | Status: DC
  Administered 2018-05-20 – 2018-05-22 (×3): 120 mg via ORAL
  Filled 2018-05-20 (×3): qty 1

## 2018-05-20 MED ORDER — ASPIRIN 81 MG PO CHEW
81.0000 mg | CHEWABLE_TABLET | Freq: Two times a day (BID) | ORAL | Status: DC
  Administered 2018-05-20: 81 mg via ORAL
  Filled 2018-05-20: qty 1

## 2018-05-20 NOTE — Progress Notes (Signed)
Inpatient Rehabilitation  Per PT and OT request, patient was screened by Gunnar Fusi for appropriateness for an Inpatient Acute Rehab consult. Note that Orthopedics is recommending SNF and unsure is this diagnosis meets medical neessity.  If primary MD team wants patient to be evaluated for CIR then please place rehab consult order.  Call if questions.   Carmelia Roller., CCC/SLP Admission Coordinator  [place]  Cell [phone]

## 2018-05-20 NOTE — Progress Notes (Signed)
Subjective: Patient reports pain as moderate.  Tolerating some diet.  Urinating - Foley in place.    No CP, SOB.  Not yet OOB.  Objective:   VITALS:   Vitals:   05/19/18 1933 05/20/18 0107 05/20/18 0126 05/20/18 0359  BP: 108/62  118/66 108/69  Pulse: 97 86 90 99  Resp:      Temp: 98 F (36.7 C)  98.2 F (36.8 C) 98 F (36.7 C)  TempSrc: Oral  Oral Oral  SpO2: 100% 99% 99% 98%  Weight:      Height:       CBC Latest Ref Rng & Units 05/19/2018 05/19/2018 04/09/2018  WBC 4.0 - 10.5 K/uL 4.3 5.3 5.3  Hemoglobin 13.0 - 17.0 g/dL 10.6(L) 10.6(L) 13.7  Hematocrit 39.0 - 52.0 % 32.3(L) 32.9(L) 41.2  Platelets 150 - 400 K/uL 152 147(L) 204   BMP Latest Ref Rng & Units 05/19/2018 04/09/2018 12/25/2017  Glucose 70 - 99 mg/dL 119(H) 110(H) 100(H)  BUN 8 - 23 mg/dL 12 12 10   Creatinine 0.61 - 1.24 mg/dL 0.57(L) 0.55(L) 0.47(L)  BUN/Creat Ratio 6 - 22 (calc) - 22 21  Sodium 135 - 145 mmol/L 139 140 134(L)  Potassium 3.5 - 5.1 mmol/L 3.7 4.2 4.1  Chloride 98 - 111 mmol/L 106 102 95(L)  CO2 22 - 32 mmol/L 25 27 30   Calcium 8.9 - 10.3 mg/dL 8.4(L) 9.1 9.8   Intake/Output      09/29 0701 - 09/30 0700 09/30 0701 - 10/01 0700   P.O.     I.V. (mL/kg) 2891 (34.5)    Other 1200    IV Piggyback 200    Total Intake(mL/kg) 4291 (51.1)    Urine (mL/kg/hr) 125 (0.1)    Blood 75    Total Output 200    Net +4091            Physical Exam: General: NAD.  Supine in bed on arrival.  Calm, conversant.  No increased work of breathing.  IS to 1300  MSK RLE: Elevated leg on pillows on arrival.  Ace wrap and dressings CDI. Dorsiflexion plantarflexion EHL FHL intact distally.  Calf compressible. Sensation intact distally.  LLE: Foot warm.  Knee immobilizer in place.  Mild swelling.  Small, approximately 2 x 1 cm area of the prepatellar region with small fluid collection-this appears to be early noninfected prepatellar bursitis.    Sensation intact distally Dorsiflexion/Plantar flexion  intact   Assessment / Plan:: 1 Day Post-Op  S/P Procedure(s) (LRB): INTRAMEDULLARY (IM) NAIL TIBIAL (Right) by Dr. Ernesta Amble. Percell Miller on 05/19/2018  Principal Problem:   Closed right tibial fracture Active Problems:   Essential hypertension   Atrial fibrillation ([place])   Fall   Left patella fracture   Closed right proximal tibia fracture, status post IM nail Doing well postop day 1 WBAT Elevate and apply ice  Mildly displaced and comminuted left patella fracture Non-operative management.   Maintain immobilizer WBAT in immobilizer only.  Maintain full extension No ROM Left knee  Incentive Spirometry  Weightbearing: WBAT RLE.  WBAT LLE in extension/knee immobilizer full-time.  Insicional and dressing care: Dressings left intact until follow-up Showering: Keep dressing dry VTE prophylaxis:  Aspirin 81mg  BID, SCD, ambulation.  He is not on anticoagulation for A. fib due to fall/bleeding risk. Pain control: Continue current regimen.  Minimize narcotics. Follow - up plan: 2 weeks in the office with Dr. Alain Marion.  Contact information:  Edmonia Lynch MD, Roxan Hockey PA-C  Dispo:  Per Primary.  Therapy evaluations pending.  Likely will need 24-hour care / SNF - he had difficulty with mobilization prior to his injuries and lives alone.   Charna Elizabeth Martensen III, PA-C 05/20/2018, 7:17 AM

## 2018-05-20 NOTE — Progress Notes (Signed)
Internal Medicine Attending:   I saw and examined the patient. I reviewed the resident's note and I agree with the resident's findings and plan as documented in the resident's note.  Principal Problem:   Closed right tibial fracture Active Problems:   Essential hypertension   Atrial fibrillation ([facility])   Fall   Left patella fracture  Doing well postoperatively after ORIF of the left tibia and fibula fracture. He is weight bearing as tolerated, left knee immobilized due to patellar fracture, plan for non-operative management. Right leg wrapped postoperatively. Aspirin 81mg  bid for DVT prophylaxis. I agree with CIR evaluation.   Lalla Brothers, MD

## 2018-05-20 NOTE — Evaluation (Addendum)
Physical Therapy Evaluation Patient Details Name: Tracy Brandt MRN: [id number] DOB: 19-May-1930 Today's Date: 05/20/2018   History of Present Illness  Pt is an 84 y/o male who presents s/p fall at home. He sustained a fracture in the R tib/fib s/p IM nail, and L patellar fx being managed conservatively (WBAT with knee immobilizer donned).   Clinical Impression  Pt admitted with above diagnosis. Pt currently with functional limitations due to the deficits listed below (see PT Problem List). At the time of PT eval pt was limited by pain (pain meds given at end of session), however pt demonstrated an excellent rehab effort. Due to limited bilateral knee AROM, it was difficult to achieve full stand this session, however pt was able to advance LE's to EOB and initiate sit>stand well. PTA pt managing well at home with assistance from an aide available 5 days/week (provided by the [place]). Feel this patient would thrive in the CIR setting and would be able to tolerate the increased frequency of therapy as pain is better controlled. Acutely, pt will benefit from skilled PT to increase their independence and safety with mobility to allow discharge to the venue listed below.       Follow Up Recommendations CIR;Supervision/Assistance - 24 hour    Equipment Recommendations  Other (comment)(TBD by next venue of care)    Recommendations for Other Services Rehab consult     Precautions / Restrictions Precautions Precautions: Fall Required Braces or Orthoses: Knee Immobilizer - Left Knee Immobilizer - Left: On at all times Restrictions Weight Bearing Restrictions: Yes RLE Weight Bearing: Weight bearing as tolerated LLE Weight Bearing: Weight bearing as tolerated(With knee immobilizer donned) NO ROM L knee     Mobility  Bed Mobility Overal bed mobility: Needs Assistance Bed Mobility: Supine to Sit;Sit to Supine     Supine to sit: Max assist;+2 for physical assistance;HOB elevated Sit to supine:  Total assist;+2 for physical assistance   General bed mobility comments: VC's for sequencing and general safety with transition to EOB. Pt required assist with LE advancement (was able to initiate), and trunk elevation  Transfers Overall transfer level: Needs assistance Equipment used: Rolling walker (2 wheeled) Transfers: Sit to/from Stand Sit to Stand: Max assist;+2 physical assistance;From elevated surface         General transfer comment: Attempted sit<>stand x2 from elevated surface. Pt had difficulty getting base of support under center of gravity due to limited knee flexion on the R and knee immobilizer on the L. We were able to assist pt with power-up enough to clear hips from bed but pt was unable to extend trunk to full standing position.   Ambulation/Gait             General Gait Details: Unable at this time.   Stairs            Wheelchair Mobility    Modified Rankin (Stroke Patients Only)       Balance Overall balance assessment: Needs assistance Sitting-balance support: Feet supported;Single extremity supported Sitting balance-Leahy Scale: Poor Sitting balance - Comments: Pt with at least 1 UE support on bed likely due to pain.    Standing balance support: Bilateral upper extremity supported;During functional activity Standing balance-Leahy Scale: Zero Standing balance comment: +2 assist required.                              Pertinent Vitals/Pain Pain Assessment: Faces Faces Pain Scale: Hurts even more  Pain Location: BLE's, L shoulder (arthritis), B hips (reports stiff from laying in bed) Pain Descriptors / Indicators: Operative site guarding;Grimacing Pain Intervention(s): Limited activity within patient's tolerance;Monitored during session;Repositioned    Home Living Family/patient expects to be discharged to:: Private residence Living Arrangements: Alone Available Help at Discharge: Family;Available PRN/intermittently;Personal  care attendant Type of Home: House Home Access: Stairs to enter;Ramped entrance   Entrance Stairs-Number of Steps: 1 in back - ramp in front Home Layout: One level Home Equipment: Youth worker - 4 wheels;Shower seat Additional Comments: Rollator does not fit in the bathroom.     Prior Function Level of Independence: Needs assistance   Gait / Transfers Assistance Needed: He uses the electric scooter when outside or out in the community.   ADL's / Homemaking Assistance Needed: Has an aide that comes in 5 days a week. She assists with cooking, laundry, and some ADL's.         Hand Dominance        Extremity/Trunk Assessment   Upper Extremity Assessment Upper Extremity Assessment: LUE deficits/detail LUE Deficits / Details: Decreased strength and AROM consistent with baseline arthritis    Lower Extremity Assessment Lower Extremity Assessment: RLE deficits/detail;LLE deficits/detail RLE Deficits / Details: Acute pain, decreased strength and AROM consistent with IM nailing of tib/fib fx RLE: Unable to fully assess due to pain LLE Deficits / Details: Acute pain, decreased strength and AROM consistent with patellar fx.  LLE: Unable to fully assess due to pain;Unable to fully assess due to immobilization    Cervical / Trunk Assessment Cervical / Trunk Assessment: Other exceptions Cervical / Trunk Exceptions: forward head posture with rounded shoulders  Communication   Communication: HOH(Hears better out of R ear)  Cognition Arousal/Alertness: Awake/alert Behavior During Therapy: WFL for tasks assessed/performed Overall Cognitive Status: Within Functional Limits for tasks assessed                                 General Comments: Emotional/tearful after requiring assistance to perform peri-care      General Comments      Exercises     Assessment/Plan    PT Assessment Patient needs continued PT services  PT Problem List Decreased  strength;Decreased range of motion;Decreased activity tolerance;Decreased balance;Decreased mobility;Decreased knowledge of use of DME;Decreased safety awareness;Decreased knowledge of precautions;Pain       PT Treatment Interventions DME instruction;Gait training;Stair training;Functional mobility training;Therapeutic activities;Therapeutic exercise;Neuromuscular re-education;Patient/family education    PT Goals (Current goals can be found in the Care Plan section)  Acute Rehab PT Goals Patient Stated Goal: Be able to take care of himself  PT Goal Formulation: With patient Time For Goal Achievement: 06/03/18 Potential to Achieve Goals: Good    Frequency Min 3X/week   Barriers to discharge        Co-evaluation PT/OT/SLP Co-Evaluation/Treatment: Yes Reason for Co-Treatment: Complexity of the patient's impairments (multi-system involvement);For patient/therapist safety;To address functional/ADL transfers PT goals addressed during session: Mobility/safety with mobility;Balance;Proper use of DME         AM-PAC PT "6 Clicks" Daily Activity  Outcome Measure Difficulty turning over in bed (including adjusting bedclothes, sheets and blankets)?: Unable Difficulty moving from lying on back to sitting on the side of the bed? : Unable Difficulty sitting down on and standing up from a chair with arms (e.g., wheelchair, bedside commode, etc,.)?: Unable Help needed moving to and from a bed to chair (including a wheelchair)?: Total Help needed  walking in hospital room?: Total Help needed climbing 3-5 steps with a railing? : Total 6 Click Score: 6    End of Session Equipment Utilized During Treatment: Gait belt Activity Tolerance: Patient limited by pain Patient left: in bed;with call bell/phone within reach;with family/visitor present Nurse Communication: Mobility status;Need for lift equipment PT Visit Diagnosis: Unsteadiness on feet (R26.81);Pain;Difficulty in walking, not elsewhere  classified (R26.2) Pain - Right/Left: (Bilateral) Pain - part of body: Knee;Leg    Time: 5672-0919 PT Time Calculation (min) (ACUTE ONLY): 35 min   Charges:   PT Evaluation $PT Eval Moderate Complexity: 1 Mod          Rolinda Roan, PT, DPT Acute Rehabilitation Services Pager: [phone] Office: [phone]   Thelma Comp 05/20/2018, 9:29 AM

## 2018-05-20 NOTE — Plan of Care (Signed)

## 2018-05-20 NOTE — Consult Note (Signed)
Physical Medicine and Rehabilitation Consult Reason for Consult: Decreased functional mobility Referring Physician: Internal medicine   HPI: Tracy Brandt is a 84 y.o. right-handed male with history of atrial fibrillation not on anticoagulation, hyperlipidemia, hypertension, prediabetes.  Per chart review and family, patient lives alone.  He has a CNA that visits 5 days a week for 2 hours at a time.  He ambulates with a walker and a motorized scooter in the community.  Family plans to assist as needed as well as increase time from CNA.  Presented 05/18/2018 after mechanical fall without loss of consciousness.  X-rays and imaging revealed nondisplaced proximal right tib-fib fracture as well as minimally displaced comminuted left patella fracture.  Underwent IM nailing right tib-fib fracture 05/19/2018 per Dr. Percell Miller.  Placed in knee immobilizer for left patella fracture.  Patient is weightbearing as tolerated bilateral lower extremities.  Hospital course pain management.  Subcutaneous Lovenox for DVT prophylaxis.  Acute blood loss anemia 10.6 and monitored.  Therapy evaluation completed with recommendations of physical medicine rehab consult.  Review of Systems  Constitutional: Negative for chills and fever.  HENT: Negative for hearing loss.   Eyes: Negative for blurred vision and double vision.  Respiratory: Negative for cough and shortness of breath.   Cardiovascular: Negative for chest pain and leg swelling.  Gastrointestinal: Positive for constipation. Negative for nausea and vomiting.       GERD  Genitourinary: Positive for urgency. Negative for flank pain and hematuria.  Musculoskeletal: Positive for falls, joint pain and myalgias.  Skin: Negative for rash.  Neurological: Positive for weakness.  All other systems reviewed and are negative.  Past Medical History:  Diagnosis Date  . Arthritis   . BPH (benign prostatic hyperplasia)   . GERD (gastroesophageal reflux disease)   .  Gout   . Hyperlipidemia   . Hypertension   . Prediabetes   . Vitamin D deficiency    Past Surgical History:  Procedure Laterality Date  . BACK SURGERY    . EYE SURGERY    . HEMORRHOID SURGERY    . LEG SURGERY     Family History  Problem Relation Age of Onset  . Heart disease Mother   . Hypertension Mother   . Heart disease Father   . Cancer Neg Hx   . Diabetes Neg Hx   . Stroke Neg Hx    Social History:  reports that he has never smoked. He has never used smokeless tobacco. He reports that he does not drink alcohol or use drugs. Allergies: No Known Allergies Medications Prior to Admission  Medication Sig Dispense Refill  . aspirin EC 81 MG tablet Take 81 mg by mouth daily.    Marland Kitchen diltiazem (CARDIZEM CD) 240 MG 24 hr capsule Take 240 mg by mouth daily.    Marland Kitchen docusate sodium (COLACE) 50 MG capsule Take 50 mg by mouth 2 (two) times daily as needed (to prevent constipation).    . hydrochlorothiazide (HYDRODIURIL) 25 MG tablet Take 12.5 mg by mouth daily.    Marland Kitchen HYDROcodone-acetaminophen (NORCO/VICODIN) 5-325 MG tablet Take 1-2 tablets by mouth 3 (three) times daily as needed (for pain).    Marland Kitchen lisinopril (PRINIVIL,ZESTRIL) 40 MG tablet TAKE 1 TAB IN THE MORNING AND 1 TAB IN THE EVENING OR AS DIRECTED. (Patient taking differently: Take 40 mg by mouth daily. ) 180 tablet 1  . meloxicam (MOBIC) 15 MG tablet Take 15 mg by mouth daily.    . minoxidil (LONITEN) 10  MG tablet Take 1 tablet daily for BP (Patient taking differently: Take 10 mg by mouth daily. ) 90 tablet 3  . omeprazole (PRILOSEC) 20 MG capsule Take 20 mg by mouth daily before breakfast.    . polyethylene glycol powder (GLYCOLAX/MIRALAX) powder Take 17 g by mouth daily as needed for mild constipation or moderate constipation.    . bisoprolol-hydrochlorothiazide (ZIAC) 10-6.25 MG tablet Take 1/2 to 1 tablet daily for BP (Patient not taking: Reported on 05/18/2018) 90 tablet 1  . diltiazem (CARDIZEM CD) 180 MG 24 hr capsule Take 1  capsule (180 mg total) by mouth daily. (Patient not taking: Reported on 05/18/2018) 30 capsule 11  . doxazosin (CARDURA) 8 MG tablet Take 8 mg by mouth at bedtime.    . furosemide (LASIX) 40 MG tablet Take 1 tablet (40 mg total) by mouth daily. (Patient not taking: Reported on 05/18/2018) 30 tablet 11  . Magnesium 250 MG TABS Take 250 mg by mouth 2 (two) times daily.     . ondansetron (ZOFRAN) 4 MG tablet Take 1 tablet (4 mg total) by mouth every 6 (six) hours as needed. (Patient not taking: Reported on 05/18/2018) 20 tablet 0  . prochlorperazine (COMPAZINE) 5 MG tablet Take 1 tablet 3 to 4 x/day if needed for Nausea /Vomitting (Patient not taking: Reported on 05/18/2018) 30 tablet 0  . sertraline (ZOLOFT) 100 MG tablet Take 100 mg by mouth daily.      Home: Home Living Family/patient expects to be discharged to:: Private residence Living Arrangements: Alone Available Help at Discharge: Family, Available PRN/intermittently, Personal care attendant Type of Home: House Home Access: Stairs to enter, Ramped entrance Entrance Stairs-Number of Steps: 1 in back - ramp in front [place]: One level Bathroom Shower/Tub: Multimedia programmer: Handicapped height [place]: Transport planner, Environmental consultant - 4 wheels, Shower seat Additional Comments: Rollator does not fit in the bathroom.   Functional History: Prior Function Level of Independence: Needs assistance Gait / Transfers Assistance Needed: He uses the electric scooter when outside or out in the community.  ADL's / Homemaking Assistance Needed: Has an aide that comes in 5 days a week. She assists with cooking, laundry, and supervision for shower transfer Functional Status:  Mobility: Bed Mobility Overal bed mobility: Needs Assistance Bed Mobility: Supine to Sit, Sit to Supine Supine to sit: Max assist, +2 for physical assistance, HOB elevated Sit to supine: Total assist, +2 for physical assistance General bed mobility comments:  VC's for sequencing and general safety with transition to EOB. Pt required assist with LE advancement (was able to initiate), and trunk elevation Transfers Overall transfer level: Needs assistance Equipment used: Rolling walker (2 wheeled) Transfers: Sit to/from Stand Sit to Stand: Max assist, +2 physical assistance, From elevated surface General transfer comment: Attempted sit<>stand x2 from elevated surface. Pt had difficulty getting base of support under center of gravity due to limited knee flexion on the R and knee immobilizer on the L. We were able to assist pt with power-up enough to clear hips from bed but pt was unable to extend trunk to full standing position.  Ambulation/Gait General Gait Details: Unable at this time.     ADL: ADL Overall ADL's : Needs assistance/impaired Eating/Feeding: Set up, Supervision/ safety, Bed level Grooming: Set up, Supervision/safety, Bed level Upper Body Bathing: Minimal assistance, Sitting Lower Body Bathing: Maximal assistance, +2 for physical assistance, Sit to/from stand Upper Body Dressing : Minimal assistance, Sitting Lower Body Dressing: Sit to/from stand, Maximal assistance, +2  for physical assistance Toileting- Clothing Manipulation and Hygiene: Sit to/from stand, Total assistance, +2 for physical assistance(+3 for peri care) Toileting - Clothing Manipulation Details (indicate cue type and reason): Pt requiring Max A +2 for standing and then third person to provide assistance for peri care  Functional mobility during ADLs: Maximal assistance, +2 for physical assistance, Rolling walker General ADL Comments: Pt agreeable to therapy and motivated to participate despite pain. Pt requiring Max A +2 for sit<>stand.  Cognition: Cognition Overall Cognitive Status: Within Functional Limits for tasks assessed Orientation Level: Oriented X4 Cognition Arousal/Alertness: Awake/alert Behavior During Therapy: WFL for tasks assessed/performed Overall  Cognitive Status: Within Functional Limits for tasks assessed General Comments: Emotional/tearful after requiring assistance to perform peri-care  Blood pressure 108/69, pulse 99, temperature 98 F (36.7 C), temperature source Oral, resp. rate 17, height 5\' 11"  (1.803 m), weight 83.9 kg, SpO2 98 %. Physical Exam  Vitals reviewed. Constitutional: He is oriented to person, place, and time. He appears well-developed and well-nourished.  HENT:  Head: Normocephalic and atraumatic.  Eyes: EOM are normal. Right eye exhibits no discharge. Left eye exhibits no discharge.  Neck: Normal range of motion. Neck supple. No thyromegaly present.  Cardiovascular:  Irregularly irregular  Respiratory: Effort normal and breath sounds normal. No respiratory distress.  GI: Soft. Bowel sounds are normal. He exhibits no distension.  Musculoskeletal:  Bilateral lower extremity tenderness and edema  Neurological: He is alert and oriented to person, place, and time.  Skin:  Right leg with dressing C/D/I  Psychiatric: He has a normal mood and affect. His behavior is normal.    No results found for this or any previous visit (from the past 24 hour(s)). Dg Tibia/fibula Left  Result Date: 05/18/2018 CLINICAL DATA:  Fall, bilateral knee pain EXAM: LEFT TIBIA AND FIBULA - 2 VIEW COMPARISON:  None. FINDINGS: Minimally displaced inferior patellar fracture on the lateral view. Tibia and fibula are intact. Knee joint spaces are preserved. Mild prepatellar soft tissue swelling. IMPRESSION: Minimally displaced inferior patellar fracture on the lateral view. Electronically Signed   By: Julian Hy M.D.   On: 05/18/2018 18:59   Dg Tibia/fibula Right  Result Date: 05/19/2018 CLINICAL DATA:  Tibial fracture. EXAM: DG C-ARM 61-120 MIN; RIGHT TIBIA AND FIBULA - 2 VIEW FLUOROSCOPY TIME:  43.4 seconds. COMPARISON:  Radiographs of May 18, 2018. FINDINGS: Four intraoperative fluoroscopic images demonstrate the patient be  status post intramedullary rod fixation of proximal right tibial shaft fracture. Good alignment of fracture components is noted. Proximal fibular fracture is noted as well. IMPRESSION: Status post intramedullary rod of fixation of proximal right tibial shaft fracture. Electronically Signed   By: Marijo Conception, M.D.   On: 05/19/2018 15:29   Dg Tibia/fibula Right  Result Date: 05/18/2018 CLINICAL DATA:  Fall, bilateral knee pain EXAM: RIGHT TIBIA AND FIBULA - 2 VIEW COMPARISON:  None. FINDINGS: Transverse proximal tibial shaft fracture, nondisplaced. Mildly comminuted fibular shaft fracture, with mild apex posterior angulation and minimal displacement. Distal fibula/lateral malleolus is mildly irregular, although this appearance favors a chronic deformity. No definite ankle fracture. The visualized soft tissues are unremarkable. IMPRESSION: Proximal tibial and fibular shaft fractures, as above. Electronically Signed   By: Julian Hy M.D.   On: 05/18/2018 18:59   Dg Knee Complete 4 Views Left  Result Date: 05/18/2018 CLINICAL DATA:  Fall, bilateral knee pain EXAM: LEFT KNEE - COMPLETE 4+ VIEW COMPARISON:  None. FINDINGS: Suspected comminuted patellar fracture with mildly displaced inferior fragment on the  lateral view. Knee joint spaces are preserved. Moderate suprapatellar knee joint effusion. Mild prepatellar soft tissue swelling. IMPRESSION: Suspected comminuted patellar fracture, as above. Electronically Signed   By: Julian Hy M.D.   On: 05/18/2018 19:03   Dg Knee Complete 4 Views Right  Result Date: 05/18/2018 CLINICAL DATA:  Fall, bilateral knee pain EXAM: RIGHT KNEE - COMPLETE 4+ VIEW COMPARISON:  None. FINDINGS: Transverse proximal tibial shaft fracture. Mildly comminuted fibular shaft fracture with minimal displacement. Mild tricompartmental prominent in the medial compartment, with chondrocalcinosis. No definite suprapatellar knee joint effusion. Changes of the knee, most  IMPRESSION: Proximal tibial and fibular shaft fractures, as above. Electronically Signed   By: Julian Hy M.D.   On: 05/18/2018 18:57   Dg C-arm 1-60 Min  Result Date: 05/19/2018 CLINICAL DATA:  Tibial fracture. EXAM: DG C-ARM 61-120 MIN; RIGHT TIBIA AND FIBULA - 2 VIEW FLUOROSCOPY TIME:  43.4 seconds. COMPARISON:  Radiographs of May 18, 2018. FINDINGS: Four intraoperative fluoroscopic images demonstrate the patient be status post intramedullary rod fixation of proximal right tibial shaft fracture. Good alignment of fracture components is noted. Proximal fibular fracture is noted as well. IMPRESSION: Status post intramedullary rod of fixation of proximal right tibial shaft fracture. Electronically Signed   By: Marijo Conception, M.D.   On: 05/19/2018 15:29   Dg Hips Bilat With Pelvis 3-4 Views  Result Date: 05/18/2018 CLINICAL DATA:  Fall, bilateral knee pain EXAM: DG HIP (WITH OR WITHOUT PELVIS) 3-4V BILAT COMPARISON:  None. FINDINGS: No fracture or dislocation is seen. Mild degenerative changes of the right hip. Left hip joint space is preserved. Visualized bony pelvis appears intact. IMPRESSION: No fracture or dislocation is seen. Mild degenerative changes of the right hip. Electronically Signed   By: Julian Hy M.D.   On: 05/18/2018 19:00    Assessment/Plan: Diagnosis: Multi-Ortho Labs and images (see above) independently reviewed.  Records reviewed and summated above.  1. Does the need for close, 24 hr/day medical supervision in concert with the patient's rehab needs make it unreasonable for this patient to be served in a less intensive setting? Potentially  2. Co-Morbidities requiring supervision/potential complications: Acute blood loss anemia (transfuse if necessary to ensure appropriate perfusion for increased activity tolerance), post-op pain (Biofeedback training with therapies to help reduce reliance on opiate pain medications, particularly IV morphine, monitor pain  control during therapies, and sedation at rest and titrate to maximum efficacy to ensure participation and gains in therapies), atrial fibrillation with RVR (not on anticoagulation, monitor heart rate with increased physical activity), hyperlipidemia, HTN (monitor and provide prns in accordance with increased physical exertion and pain), prediabetes (Monitor in accordance with exercise and adjust meds as necessary) 3. Due to safety, skin/wound care, disease management, pain management and patient education, does the patient require 24 hr/day rehab nursing? Yes 4. Does the patient require coordinated care of a physician, rehab nurse, PT (1-2 hrs/day, 5 days/week) and OT (1-2 hrs/day, 5 days/week) to address physical and functional deficits in the context of the above medical diagnosis(es)? Yes Addressing deficits in the following areas: balance, endurance, locomotion, strength, transferring, bathing, dressing, toileting and psychosocial support 5. Can the patient actively participate in an intensive therapy program of at least 3 hrs of therapy per day at least 5 days per week? Potentially 6. The potential for patient to make measurable gains while on inpatient rehab is good 7. Anticipated functional outcomes upon discharge from inpatient rehab are mod assist  with PT, mod assist with OT,  n/a with SLP. 8. Estimated rehab length of stay to reach the above functional goals is: 17-22 days. 9. Anticipated D/C setting: Other 10. Anticipated post D/C treatments: SNF 11. Overall Rehab/Functional Prognosis: good  RECOMMENDATIONS: This patient's condition is appropriate for continued rehabilitative care in the following setting: Patient with limited ambulation at baseline and inaccessible home bathroom.  Given bilateral lower extremity deficits anticipate patient will require increased assistance at discharge.  At this time recommend SNF. Patient has agreed to participate in recommended program. Potentially Note  that insurance prior authorization may be required for reimbursement for recommended care.  Comment: Rehab Admissions Coordinator to follow up.   I have personally performed a face to face diagnostic evaluation, including, but not limited to relevant history and physical exam findings, of this patient and developed relevant assessment and plan.  Additionally, I have reviewed and concur with the physician assistant's documentation above.   Delice Lesch, MD, ABPMR Lavon Paganini Angiulli, PA-C 05/20/2018

## 2018-05-20 NOTE — Evaluation (Signed)
Occupational Therapy Evaluation Patient Details Name: Tracy Brandt MRN: [id number] DOB: 04-08-1930 Today's Date: 05/20/2018    History of Present Illness Pt is an 84 y/o male who presents s/p fall at home. He sustained a fracture in the R tib/fib s/p IM nail, and L patellar fx being managed conservatively (WBAT with knee immobilizer donned).    Clinical Impression   PTA, pt was living alone and was independent with ADLs and had an aide who came 5 days a week and performed IADLs. Pt currently requiring Min A for UB ADLs, Max A +2 for LB ADLs, and Max A +2 for functional transfer with RW. Pt with decreased strength and balance. Pt motivated to participate in therapy despite pain. Pt will require further acute OT to increase safety and independence with ADLs and functional mobility. Due to pt's motivation and prior independence, recommend dc to CIR for intensive OT to optimize safety, independence with ADLs, and return to PLOF as well as decrease caregiver burden.     Follow Up Recommendations  CIR;Supervision/Assistance - 24 hour    Equipment Recommendations  Other (comment)(Defer to next venue)    Recommendations for Other Services PT consult     Precautions / Restrictions Precautions Precautions: Fall Required Braces or Orthoses: Knee Immobilizer - Left Knee Immobilizer - Left: On at all times Restrictions Weight Bearing Restrictions: Yes RLE Weight Bearing: Weight bearing as tolerated LLE Weight Bearing: Weight bearing as tolerated(With knee immobilizer donned)      Mobility Bed Mobility Overal bed mobility: Needs Assistance Bed Mobility: Supine to Sit;Sit to Supine     Supine to sit: Max assist;+2 for physical assistance;HOB elevated Sit to supine: Total assist;+2 for physical assistance   General bed mobility comments: VC's for sequencing and general safety with transition to EOB. Pt required assist with LE advancement (was able to initiate), and trunk  elevation  Transfers Overall transfer level: Needs assistance Equipment used: Rolling walker (2 wheeled) Transfers: Sit to/from Stand Sit to Stand: Max assist;+2 physical assistance;From elevated surface         General transfer comment: Attempted sit<>stand x2 from elevated surface. Pt had difficulty getting base of support under center of gravity due to limited knee flexion on the R and knee immobilizer on the L. We were able to assist pt with power-up enough to clear hips from bed but pt was unable to extend trunk to full standing position.     Balance Overall balance assessment: Needs assistance Sitting-balance support: Feet supported;Single extremity supported Sitting balance-Leahy Scale: Poor Sitting balance - Comments: Pt with at least 1 UE support on bed likely due to pain.    Standing balance support: Bilateral upper extremity supported;During functional activity Standing balance-Leahy Scale: Zero Standing balance comment: +2 assist required.                            ADL either performed or assessed with clinical judgement   ADL Overall ADL's : Needs assistance/impaired Eating/Feeding: Set up;Supervision/ safety;Bed level   Grooming: Set up;Supervision/safety;Bed level   Upper Body Bathing: Minimal assistance;Sitting   Lower Body Bathing: Maximal assistance;+2 for physical assistance;Sit to/from stand   Upper Body Dressing : Minimal assistance;Sitting   Lower Body Dressing: Sit to/from stand;Maximal assistance;+2 for physical assistance       Toileting- Clothing Manipulation and Hygiene: Sit to/from stand;Total assistance;+2 for physical assistance(+3 for peri care) Toileting - Clothing Manipulation Details (indicate cue type and reason): Pt requiring  Max A +2 for standing and then third person to provide assistance for peri care      Functional mobility during ADLs: Maximal assistance;+2 for physical assistance;Rolling walker General ADL Comments:  Pt agreeable to therapy and motivated to participate despite pain. Pt requiring Max A +2 for sit<>stand.     Vision         Perception     Praxis      Pertinent Vitals/Pain Pain Assessment: Faces Faces Pain Scale: Hurts even more Pain Location: BLE's, L shoulder (arthritis), B hips (reports stiff from laying in bed) Pain Descriptors / Indicators: Operative site guarding;Grimacing Pain Intervention(s): Monitored during session;Limited activity within patient's tolerance;Repositioned     Hand Dominance Right   Extremity/Trunk Assessment Upper Extremity Assessment Upper Extremity Assessment: LUE deficits/detail LUE Deficits / Details: Decreased strength and AROM consistent with baseline arthritis   Lower Extremity Assessment Lower Extremity Assessment: RLE deficits/detail;LLE deficits/detail RLE Deficits / Details: Acute pain, decreased strength and AROM consistent with IM nailing of tib/fib fx RLE: Unable to fully assess due to pain LLE Deficits / Details: Acute pain, decreased strength and AROM consistent with patellar fx.  LLE: Unable to fully assess due to pain;Unable to fully assess due to immobilization   Cervical / Trunk Assessment Cervical / Trunk Assessment: Other exceptions Cervical / Trunk Exceptions: forward head posture with rounded shoulders   Communication Communication Communication: HOH(Hears better out of R ear)   Cognition Arousal/Alertness: Awake/alert Behavior During Therapy: WFL for tasks assessed/performed Overall Cognitive Status: Within Functional Limits for tasks assessed                                 General Comments: Emotional/tearful after requiring assistance to perform peri-care   General Comments  VSS on RA. Son arriving at end of session    Exercises     Shoulder Instructions      Home Living Family/patient expects to be discharged to:: Private residence Living Arrangements: Alone Available Help at Discharge:  Family;Available PRN/intermittently;Personal care attendant Type of Home: House Home Access: Stairs to enter;Ramped entrance Entrance Stairs-Number of Steps: 1 in back - ramp in front   Home Layout: One level     Bathroom Shower/Tub: Occupational psychologist: Handicapped height     Home Equipment: Youth worker - 4 wheels;Shower seat   Additional Comments: Rollator does not fit in the bathroom.       Prior Functioning/Environment Level of Independence: Needs assistance  Gait / Transfers Assistance Needed: He uses the electric scooter when outside or out in the community.  ADL's / Homemaking Assistance Needed: Has an aide that comes in 5 days a week. She assists with cooking, laundry, and supervision for shower transfer            OT Problem List: Decreased strength;Decreased range of motion;Decreased activity tolerance;Impaired balance (sitting and/or standing);Decreased knowledge of use of DME or AE;Decreased knowledge of precautions;Pain      OT Treatment/Interventions: Self-care/ADL training;Therapeutic exercise;Energy conservation;DME and/or AE instruction;Therapeutic activities;Patient/family education    OT Goals(Current goals can be found in the care plan section) Acute Rehab OT Goals Patient Stated Goal: Be able to take care of himself  OT Goal Formulation: With patient/family Time For Goal Achievement: 06/03/18 Potential to Achieve Goals: Good ADL Goals Pt Will Perform Grooming: with set-up;with supervision;sitting Pt Will Perform Lower Body Dressing: with mod assist;sit to/from stand;with adaptive equipment Pt Will Transfer to  Toilet: with min assist;with +2 assist;ambulating;bedside commode Pt Will Perform Toileting - Clothing Manipulation and hygiene: with min assist;sit to/from stand Additional ADL Goal #1: Pt will perform bed mobility with Min A in preparation for ADLs  OT Frequency: Min 2X/week   Barriers to D/C:             Co-evaluation PT/OT/SLP Co-Evaluation/Treatment: Yes Reason for Co-Treatment: Complexity of the patient's impairments (multi-system involvement);For patient/therapist safety;To address functional/ADL transfers   OT goals addressed during session: ADL's and self-care      AM-PAC PT "6 Clicks" Daily Activity     Outcome Measure Help from another person eating meals?: None Help from another person taking care of personal grooming?: A Little Help from another person toileting, which includes using toliet, bedpan, or urinal?: Total Help from another person bathing (including washing, rinsing, drying)?: A Lot Help from another person to put on and taking off regular upper body clothing?: A Little Help from another person to put on and taking off regular lower body clothing?: A Lot 6 Click Score: 15   End of Session Equipment Utilized During Treatment: Gait belt;Rolling walker Nurse Communication: Mobility status;Precautions;Weight bearing status;Patient requests pain meds  Activity Tolerance: Patient tolerated treatment well;Patient limited by pain;Patient limited by fatigue Patient left: in bed;with call bell/phone within reach;with family/visitor present;with nursing/sitter in room;with SCD's reapplied  OT Visit Diagnosis: Unsteadiness on feet (R26.81);Other abnormalities of gait and mobility (R26.89);Muscle weakness (generalized) (M62.81);Pain;History of falling (Z91.81) Pain - Right/Left: (Bilateral) Pain - part of body: Leg                Time: 7897-8478 OT Time Calculation (min): 34 min Charges:  OT General Charges $OT Visit: 1 Visit OT Evaluation $OT Eval Moderate Complexity: [place], OTR/L Acute Rehab Pager: [phone] Office: [place] 05/20/2018, 1:11 PM

## 2018-05-20 NOTE — Progress Notes (Signed)
   Subjective: Mr. Willmore was seen and evaluated on morning rounds. He was just starting PT session. He remained afebrile overnight. Complains of moderate pain in his legs with movement, but well controlled with analgesics.   Objective:  Vital signs in last 24 hours: Vitals:   05/19/18 1933 05/20/18 0107 05/20/18 0126 05/20/18 0359  BP: 108/62  118/66 108/69  Pulse: 97 86 90 99  Resp:      Temp: 98 F (36.7 C)  98.2 F (36.8 C) 98 F (36.7 C)  TempSrc: Oral  Oral Oral  SpO2: 100% 99% 99% 98%  Weight:      Height:       General: awake, alert, sitting up in bed in NAD Ext: RLE with ACE wrap and dressings intact. LLE with knee immobilizer in place  Neuro: A&Ox4; answers questions appropriately   Assessment/Plan:  Principal Problem:   Closed right tibial fracture Active Problems:   Essential hypertension   Atrial fibrillation (HCC)   Fall   Left patella fracture  1. Closed right tibial fracture s/p IM nail  - POD 1; pain regimen with Norco/Vicodin q 6 with morphine for breakthrough - bowel regimen  - working with PT for early weightbearing; indicated he is a good candidate for CIR - Lovenox for DVT prophylaxis; consider extended DVT prophylaxis on discharge depending on how much he is mobilizing   2. Left patella fracture - being managed non-operatively with knee immobilizer   3. HTN: blood pressure remains soft; continue holding anti-hypertensives   4. Afib: resumed lower dose of Diltiazem at 120 mg daily    Dispo: Anticipated discharge in approximately 3-4 day(s).   Modena Nunnery D, DO 05/20/2018, 11:10 AM Pager: [phone]

## 2018-05-21 DIAGNOSIS — Z79899 Other long term (current) drug therapy: Secondary | ICD-10-CM

## 2018-05-21 DIAGNOSIS — M170 Bilateral primary osteoarthritis of knee: Secondary | ICD-10-CM

## 2018-05-21 DIAGNOSIS — S82401D Unspecified fracture of shaft of right fibula, subsequent encounter for closed fracture with routine healing: Secondary | ICD-10-CM

## 2018-05-21 DIAGNOSIS — S82209A Unspecified fracture of shaft of unspecified tibia, initial encounter for closed fracture: Secondary | ICD-10-CM

## 2018-05-21 DIAGNOSIS — S82002D Unspecified fracture of left patella, subsequent encounter for closed fracture with routine healing: Secondary | ICD-10-CM

## 2018-05-21 DIAGNOSIS — G894 Chronic pain syndrome: Secondary | ICD-10-CM

## 2018-05-21 DIAGNOSIS — S82101D Unspecified fracture of upper end of right tibia, subsequent encounter for closed fracture with routine healing: Secondary | ICD-10-CM

## 2018-05-21 DIAGNOSIS — W19XXXD Unspecified fall, subsequent encounter: Secondary | ICD-10-CM

## 2018-05-21 DIAGNOSIS — Z9181 History of falling: Secondary | ICD-10-CM

## 2018-05-21 MED ORDER — LISINOPRIL 20 MG PO TABS
20.0000 mg | ORAL_TABLET | Freq: Every day | ORAL | Status: DC
  Administered 2018-05-21 – 2018-05-22 (×2): 20 mg via ORAL
  Filled 2018-05-21 (×2): qty 1

## 2018-05-21 MED ORDER — MORPHINE SULFATE (PF) 2 MG/ML IV SOLN
0.5000 mg | INTRAVENOUS | Status: DC | PRN
  Administered 2018-05-21: 0.5 mg via INTRAVENOUS
  Administered 2018-05-22 (×2): 1 mg via INTRAVENOUS
  Filled 2018-05-21 (×3): qty 1

## 2018-05-21 MED ORDER — HYDROCODONE-ACETAMINOPHEN 5-325 MG PO TABS
1.0000 | ORAL_TABLET | ORAL | Status: DC | PRN
  Administered 2018-05-21 – 2018-05-22 (×3): 2 via ORAL
  Administered 2018-05-22 (×2): 1 via ORAL
  Filled 2018-05-21: qty 2
  Filled 2018-05-21: qty 1
  Filled 2018-05-21: qty 2
  Filled 2018-05-21: qty 1
  Filled 2018-05-21 (×2): qty 2

## 2018-05-21 MED ORDER — ENOXAPARIN SODIUM 40 MG/0.4ML ~~LOC~~ SOLN
40.0000 mg | SUBCUTANEOUS | Status: DC
  Administered 2018-05-21 – 2018-05-22 (×2): 40 mg via SUBCUTANEOUS
  Filled 2018-05-21 (×2): qty 0.4

## 2018-05-21 MED ORDER — HYDROCHLOROTHIAZIDE 12.5 MG PO CAPS
12.5000 mg | ORAL_CAPSULE | Freq: Every day | ORAL | Status: DC
  Administered 2018-05-21 – 2018-05-22 (×2): 12.5 mg via ORAL
  Filled 2018-05-21 (×2): qty 1

## 2018-05-21 NOTE — Progress Notes (Signed)
Pt had 3 runs of V-tach at 19.50. IM is notified. Nursing will continue to monitor.

## 2018-05-21 NOTE — Progress Notes (Signed)
CSW received SNF consult, notes that consult has changed to CIR.   CSW will sign off for now, please inform if CIR does not go through for CSW to address SNF needs at that point.   [place], [place]

## 2018-05-21 NOTE — Progress Notes (Signed)
Medicine attending:  I examined this patient today together with resident physician Dr Harlow Ohms and I concur with her evaluation and management plan which we discussed together. Pleasant 84 year old man admitted on September 28 after he fell at home and sustained a closed right proximal tibia and fibular fracture and a minimally displaced inferior patellar fracture on the left.  He underwent a nail stabilization procedure on the right tibia on September 29.  Currently recuperating uneventfully. In view of his age, chronic atrial fibrillation not on chronic anticoagulation due to his fall risk, I would recommend short-term anticoagulation either continuing low molecular weight heparin or using prophylactic dose Xarelto or Eliquis.  He has normal renal function.  Aspirin is inadequate as an anticoagulant in high risk patients.

## 2018-05-21 NOTE — Progress Notes (Signed)
CSW was notified by teaching service that recommendation has changed once again from CIR back to SNF. CSW will consult with patient today.   [place], [place]

## 2018-05-21 NOTE — Care Management Important Message (Signed)
Important Message  Patient Details  Name: Tracy Brandt MRN: [id number] Date of Birth: 14-Dec-1929   Medicare Important Message Given:  Yes    Orbie Pyo 05/21/2018, 12:57 PM

## 2018-05-21 NOTE — Progress Notes (Signed)
   Subjective: Tracy Brandt reported feeling better after taking his pain medications this morning. He is still having pain in his right and left knee and understood the recovery process will take time. Reported that PT went well. Denied any SOB or chest pain.   Objective:  Vital signs in last 24 hours: Vitals:   05/21/18 0304 05/21/18 0400 05/21/18 0408 05/21/18 0946  BP: (!) 162/99 (!) 167/78 (!) 158/74 (!) 141/81  Pulse: 90   94  Resp: 16     Temp: 98.3 F (36.8 C)   98.5 F (36.9 C)  TempSrc: Oral   Oral  SpO2: 97%   98%  Weight:      Height:       Physical Exam  Constitutional: He is oriented to person, place, and time and well-developed, well-nourished, and in no distress.  Cardiovascular:  Irregularly irregular  Pulmonary/Chest: Effort normal and breath sounds normal. No respiratory distress. He has no wheezes.  Musculoskeletal: He exhibits no edema.  Neurological: He is alert and oriented to person, place, and time.  Skin: Skin is warm and dry.    Assessment/Plan:  Principal Problem:   Closed right tibial fracture Active Problems:   Essential hypertension   Atrial fibrillation (HCC)   Fall   Left patella fracture   Postoperative pain   Acute blood loss anemia   Atrial fibrillation with rapid ventricular response (HCC)   Hyperlipidemia   Prediabetes  Tracy Brandt is an 84 yo male with a PMHx of afib not on anticoagulation due to fall risk, HTN, DJD of bilateral knees and chronic pain syndrome who presented with   Closed right tibial fracture s/p IM nail  - POD 2; pain regimen with Norco/Vicodin q 6 with morphine for breakthrough - bowel regimen  - working with PT for early weightbearing; recommending SNF placement - Lovenox for DVT prophylaxis; plan to extend DVT prophylaxis with Xarelto or Eliquis on discharge depending on how much he is mobilizing  Left patella fracture - being managed non-operatively with knee immobilizer   HTN Blood pressure is  starting to increase, 161/94 today. Will restart lisinopril and HCTZ today; continue to hold minoxidil  - resume lisinopril 20 mg qd and HCTZ 12.5 mg qd   Afib -resumed lower dose of Diltiazem at 120 mg daily    Dispo: Anticipated discharge in approximately 2-3 day(s). Pending SNF placement.  Tracy Craze, DO 05/21/2018, 11:29 AM [phone]

## 2018-05-21 NOTE — Progress Notes (Addendum)
Addendum: I agree with Dr. Azucena Freed assessment regarding DVT prophylaxis and the Medicine team's plan to place on low molecular weight heparin or using prophylactic dose Xarelto or Eliquis if patient is amenable to this plan and depending the patient's ability to mobilize.  Tracy Elizabeth Martensen III, PA-C 05/21/2018 5:20 PM       Subjective: Patient reports pain as moderate.  Tolerating diet.  Urinating.    No CP, SOB.  OOB standing yesterday.  Objective:   VITALS:   Vitals:   05/20/18 2010 05/21/18 0304 05/21/18 0400 05/21/18 0408  BP: 139/80 (!) 162/99 (!) 167/78 (!) 158/74  Pulse: 83 90    Resp: 16 16    Temp: 98.9 F (37.2 C) 98.3 F (36.8 C)    TempSrc: Oral Oral    SpO2: 97% 97%    Weight:      Height:       CBC Latest Ref Rng & Units 05/19/2018 05/19/2018 04/09/2018  WBC 4.0 - 10.5 K/uL 4.3 5.3 5.3  Hemoglobin 13.0 - 17.0 g/dL 10.6(L) 10.6(L) 13.7  Hematocrit 39.0 - 52.0 % 32.3(L) 32.9(L) 41.2  Platelets 150 - 400 K/uL 152 147(L) 204   BMP Latest Ref Rng & Units 05/19/2018 04/09/2018 12/25/2017  Glucose 70 - 99 mg/dL 119(H) 110(H) 100(H)  BUN 8 - 23 mg/dL 12 12 10   Creatinine 0.61 - 1.24 mg/dL 0.57(L) 0.55(L) 0.47(L)  BUN/Creat Ratio 6 - 22 (calc) - 22 21  Sodium 135 - 145 mmol/L 139 140 134(L)  Potassium 3.5 - 5.1 mmol/L 3.7 4.2 4.1  Chloride 98 - 111 mmol/L 106 102 95(L)  CO2 22 - 32 mmol/L 25 27 30   Calcium 8.9 - 10.3 mg/dL 8.4(L) 9.1 9.8   Intake/Output      09/30 0701 - 10/01 0700   P.O. 120   Total Intake(mL/kg) 120 (1.4)   Urine (mL/kg/hr) 2000 (1)   Total Output 2000   Net -1880          Physical Exam: General: NAD.  Upright in bed on arrival.  Calm, conversant.  No increased work of breathing.  Son at bedside.  MSK RLE: Elevated leg on pillows during interview.  Ace wrap and dressings CDI. Dorsiflexion plantarflexion EHL FHL intact distally.  Calf compressible. Sensation intact distally.  LLE: Foot warm.  Knee immobilizer in place.   Mild swelling.  Small, approximately 2 x 1 cm area of the prepatellar region with small fluid collection - no erythema.  This appears to be stable noninfected prepatellar bursitis.    Sensation intact distally Dorsiflexion/Plantar flexion intact   Assessment / Plan:: 2 Days Post-Op  S/P Procedure(s) (LRB): INTRAMEDULLARY (IM) NAIL TIBIAL (Right) by Dr. Ernesta Amble. Percell Miller on 05/19/2018  Principal Problem:   Closed right tibial fracture Active Problems:   Essential hypertension   Atrial fibrillation ([place])   Fall   Left patella fracture   Postoperative pain   Acute blood loss anemia   Atrial fibrillation with rapid ventricular response (HCC)   Hyperlipidemia   Prediabetes   Closed right proximal tibia fracture, status post IM nail Doing well postop day 2 More sore this morning WBAT Elevate and apply ice  Mildly displaced and comminuted left patella fracture Non-operative management.   Maintain immobilizer WBAT in immobilizer only.  Maintain full extension No ROM Left knee  Incentive Spirometry  Weightbearing: WBAT RLE.  WBAT LLE in extension/knee immobilizer full-time.  Insicional and dressing care: Dressings left intact until follow-up Showering: Keep dressing dry VTE prophylaxis:  Aspirin 81mg  BID, SCD, ambulation.  He is not on anticoagulation for A. fib due to fall/bleeding risk. Pain control: Continue current regimen.  Minimize narcotics.  Contact information:  Edmonia Lynch MD, Roxan Hockey PA-C  Dispo: Stable from an orthopedic perspective.  CIR vs SNF planning in progress.  Follow - up plan: Follow up in the office with Dr. Alain Marion in 2 weeks.  Please call with questions.   Tracy Elizabeth Martensen III, PA-C 05/21/2018, 6:50 AM

## 2018-05-21 NOTE — Progress Notes (Signed)
Inpatient Rehabilitation Admissions Coordinator  I met with patient at bedside with his sister and his home CNA. I discussed Dr. Serita Grit recommendations for SNF due to his prior mobility issues and need for a longer rehab recovery. They are in agreement to SNF and state they have a few SNF options they prefer. Noted SW, Caryl Pina, is aware of SNF need. I encouraged pt to discuss with SW of their wishes. We will sign off at this time.  Danne Baxter, RN, MSN Rehab Admissions Coordinator [phone] 05/21/2018 12:32 PM

## 2018-05-21 NOTE — NC FL2 (Signed)
[place] MEDICAID FL2 LEVEL OF CARE SCREENING TOOL     IDENTIFICATION  Patient Name: Tracy Brandt Birthdate: 1929/12/30 Sex: male Admission Date (Current Location): 05/18/2018  [facility] and [place] Number:  Herbalist and Address:  [facility] [place], [place] [address] [place], [place] [address]      Provider Number: [id number]  Attending Physician Name and Address:  Annia Belt, MD  Relative Name and Phone Number:  Olegario Shearer (daughter) [phone]    Current Level of Care: Hospital Recommended Level of Care: [place] Prior Approval Number:    Date Approved/Denied:   PASRR Number: [id number]  Discharge Plan: SNF    Current Diagnoses: Patient Active Problem List   Diagnosis Date Noted  . Postoperative pain   . Acute blood loss anemia   . Atrial fibrillation with rapid ventricular response ([place])   . Hyperlipidemia   . Prediabetes   . Closed right tibial fracture 05/18/2018  . Hyperlipidemia, mixed 04/09/2018  . Atrial fibrillation ([place]) 01/31/2016  . Anticoagulation therapy not indicated 01/31/2016  . Fall 01/31/2016  . GERD (gastroesophageal reflux disease) 01/31/2016  . BPH with obstruction/lower urinary tract symptoms 01/31/2016  . Left patella fracture 01/31/2016  . Chronic pain syndrome 01/31/2016  . Medication management 07/21/2015  . Depression, controlled 02/02/2015  . Spinal stenosis, lumbar region, with neurogenic claudication 09/17/2014  . BMI 25.0-25.9,adult 06/08/2014  . Essential hypertension   . Dyslipidemia   . Abnormal glucose   . Vitamin D deficiency   . Gout     Orientation RESPIRATION BLADDER Height & Weight     Self, Time, Situation, Place  Normal Continent Weight: 185 lb (83.9 kg) Height:  5\' 11"  (180.3 cm)  BEHAVIORAL SYMPTOMS/MOOD NEUROLOGICAL BOWEL NUTRITION STATUS      Continent Diet(see discharge summary)  AMBULATORY STATUS COMMUNICATION OF NEEDS Skin   Limited Assist  Verbally Surgical wounds(right leg surgical incision)                       Personal Care Assistance Level of Assistance  Bathing, Feeding, Dressing Bathing Assistance: Limited assistance Feeding assistance: Independent Dressing Assistance: Limited assistance     Functional Limitations Info  Sight, Hearing, Speech Sight Info: Adequate Hearing Info: Adequate Speech Info: Adequate    SPECIAL CARE FACTORS FREQUENCY  PT (By licensed PT), OT (By licensed OT)     PT Frequency: 5x weekly OT Frequency: 5x weekly            Contractures Contractures Info: Not present    Additional Factors Info                  Current Medications (05/21/2018):  This is the current hospital active medication list Current Facility-Administered Medications  Medication Dose Route Frequency Provider Last Rate Last Dose  . acetaminophen (TYLENOL) tablet 325-650 mg  325-650 mg Oral Q6H PRN Prudencio Burly III, PA-C      . diltiazem (CARDIZEM CD) 24 hr capsule 120 mg  120 mg Oral Daily [place], Carley D, DO   120 mg at 05/21/18 0949  . docusate sodium (COLACE) capsule 100 mg  100 mg Oral BID Prudencio Burly III, PA-C   100 mg at 05/21/18 8850  . enoxaparin (LOVENOX) injection 40 mg  40 mg Subcutaneous Q24H Kalman Shan Ratliff, DO   40 mg at 05/21/18 0948  . hydrochlorothiazide (MICROZIDE) capsule 12.5 mg  12.5 mg Oral Daily [place], Elza Rafter, DO  12.5 mg at 05/21/18 1223  . HYDROcodone-acetaminophen (NORCO/VICODIN) 5-325 MG per tablet 1-2 tablet  1-2 tablet Oral Q4H PRN Prudencio Burly III, PA-C   2 tablet at 05/21/18 1043  . Influenza vac split quadrivalent PF (FLUZONE HIGH-DOSE) injection 0.5 mL  0.5 mL Intramuscular Tomorrow-1000 Axel Filler, MD      . lisinopril (PRINIVIL,ZESTRIL) tablet 20 mg  20 mg Oral Daily Kalman Shan Ratliff, DO   20 mg at 05/21/18 1223  . methocarbamol (ROBAXIN) tablet 500 mg  500 mg Oral Q6H PRN Prudencio Burly III, PA-C   500 mg at 05/19/18 1738   Or  . methocarbamol (ROBAXIN) 500 mg in dextrose 5 % 50 mL IVPB  500 mg Intravenous Q6H PRN Prudencio Burly III, PA-C      . metoCLOPramide (REGLAN) tablet 5-10 mg  5-10 mg Oral Q8H PRN Prudencio Burly III, PA-C       Or  . metoCLOPramide (REGLAN) injection 5-10 mg  5-10 mg Intravenous Q8H PRN Prudencio Burly III, PA-C      . morphine 2 MG/ML injection 0.5-1 mg  0.5-1 mg Intravenous Q3H PRN Prudencio Burly III, PA-C   0.5 mg at 05/21/18 1227  . ondansetron (ZOFRAN) tablet 4 mg  4 mg Oral Q6H PRN Prudencio Burly III, PA-C       Or  . ondansetron [facility]) injection 4 mg  4 mg Intravenous Q6H PRN Prudencio Burly III, PA-C      . pantoprazole (PROTONIX) EC tablet 40 mg  40 mg Oral Daily Prudencio Burly III, PA-C   40 mg at 05/21/18 0949  . polyethylene glycol (MIRALAX / GLYCOLAX) packet 17 g  17 g Oral Daily PRN Prudencio Burly III, PA-C         Discharge Medications: Please see discharge summary for a list of discharge medications.  Relevant Imaging Results:  Relevant Lab Results:   Additional Information SSN: [id number]  Alberteen Sam, LCSW

## 2018-05-21 NOTE — Clinical Social Work Note (Signed)
Clinical Social Work Assessment  Patient Details  Name: Tracy Brandt MRN: [id number] Date of Birth: [birth year]  Date of referral:  05/21/18               Reason for consult:  Discharge Planning, Care Management Concerns                Permission sought to share information with:  Facility Sport and exercise psychologist, Case Manager, Family Supports Permission granted to share information::  Yes, Verbal Permission Granted  Name::     Futures trader::  SNFs  Relationship::  Daughter  Contact Information:  [phone]  Housing/Transportation Living arrangements for the past 2 months:  [place] of Information:  Patient Patient Interpreter Needed:  None Criminal Activity/Legal Involvement Pertinent to Current Situation/Hospitalization:  No - Comment as needed Significant Relationships:  Adult Children Lives with:  Self Do you feel safe going back to the place where you live?  No Need for family participation in patient care:  Yes (Comment)  Care giving concerns:  CSW received referral for possible SNF placement at time of discharge. Spoke with patient regarding possibility of SNF placement . Patient and family  are currently unable to care for him at their home given patient's current needs and fall risk.  Patient and daughter   expressed understanding of PT recommendation and are agreeable to SNF placement at time of discharge. CSW to continue to follow and assist with discharge planning needs.     Social Worker assessment / plan:  Spoke with patient and  daughter concerning possibility of rehab at [facility] before returning home.    Employment status:  Retired Forensic scientist:  Medicare PT Recommendations:  [place] / Referral to community resources:  [place]  Patient/Family's Response to care: Patient and daughter  recognize need for rehab before returning home and are agreeable to a SNF . They report preference for    [name] . CSW explained insurance authorization process. Patient's family reported that they want patient to get stronger to be able to come back home.    Patient/Family's Understanding of and Emotional Response to Diagnosis, Current Treatment, and Prognosis:  Patient/family is realistic regarding therapy needs and expressed being hopeful for SNF placement. Patient expressed understanding of CSW role and discharge process as well as medical condition. No questions/concerns about plan or treatment.    Emotional Assessment Appearance:  Appears stated age Attitude/Demeanor/Rapport:  Self-Confident, Gracious, Engaged Affect (typically observed):  Accepting Orientation:  Oriented to Self, Oriented to Place, Oriented to  Time, Oriented to Situation Alcohol / Substance use:  Not Applicable Psych involvement (Current and /or in the community):  No (Comment)  Discharge Needs  Concerns to be addressed:  Discharge Planning Concerns, Care Coordination Readmission within the last 30 days:  No Current discharge risk:  Dependent with Mobility Barriers to Discharge:  Continued Medical Work up   [employer], LCSW 05/21/2018, 3:06 PM

## 2018-05-22 DIAGNOSIS — G8911 Acute pain due to trauma: Secondary | ICD-10-CM | POA: Diagnosis not present

## 2018-05-22 DIAGNOSIS — R531 Weakness: Secondary | ICD-10-CM | POA: Diagnosis not present

## 2018-05-22 DIAGNOSIS — M79661 Pain in right lower leg: Secondary | ICD-10-CM | POA: Diagnosis not present

## 2018-05-22 DIAGNOSIS — E785 Hyperlipidemia, unspecified: Secondary | ICD-10-CM | POA: Diagnosis not present

## 2018-05-22 DIAGNOSIS — Z23 Encounter for immunization: Secondary | ICD-10-CM | POA: Diagnosis not present

## 2018-05-22 DIAGNOSIS — N183 Chronic kidney disease, stage 3 (moderate): Secondary | ICD-10-CM | POA: Diagnosis not present

## 2018-05-22 DIAGNOSIS — R2689 Other abnormalities of gait and mobility: Secondary | ICD-10-CM | POA: Diagnosis not present

## 2018-05-22 DIAGNOSIS — G894 Chronic pain syndrome: Secondary | ICD-10-CM | POA: Diagnosis not present

## 2018-05-22 DIAGNOSIS — Z7401 Bed confinement status: Secondary | ICD-10-CM | POA: Diagnosis not present

## 2018-05-22 DIAGNOSIS — I951 Orthostatic hypotension: Secondary | ICD-10-CM | POA: Diagnosis not present

## 2018-05-22 DIAGNOSIS — S82401D Unspecified fracture of shaft of right fibula, subsequent encounter for closed fracture with routine healing: Secondary | ICD-10-CM | POA: Diagnosis not present

## 2018-05-22 DIAGNOSIS — F329 Major depressive disorder, single episode, unspecified: Secondary | ICD-10-CM | POA: Diagnosis not present

## 2018-05-22 DIAGNOSIS — M48062 Spinal stenosis, lumbar region with neurogenic claudication: Secondary | ICD-10-CM | POA: Diagnosis not present

## 2018-05-22 DIAGNOSIS — S82201D Unspecified fracture of shaft of right tibia, subsequent encounter for closed fracture with routine healing: Secondary | ICD-10-CM | POA: Diagnosis not present

## 2018-05-22 DIAGNOSIS — S82101D Unspecified fracture of upper end of right tibia, subsequent encounter for closed fracture with routine healing: Secondary | ICD-10-CM | POA: Diagnosis not present

## 2018-05-22 DIAGNOSIS — S82002D Unspecified fracture of left patella, subsequent encounter for closed fracture with routine healing: Secondary | ICD-10-CM | POA: Diagnosis not present

## 2018-05-22 DIAGNOSIS — I509 Heart failure, unspecified: Secondary | ICD-10-CM | POA: Diagnosis not present

## 2018-05-22 DIAGNOSIS — W19XXXA Unspecified fall, initial encounter: Secondary | ICD-10-CM | POA: Diagnosis not present

## 2018-05-22 DIAGNOSIS — Z7901 Long term (current) use of anticoagulants: Secondary | ICD-10-CM | POA: Diagnosis not present

## 2018-05-22 DIAGNOSIS — I10 Essential (primary) hypertension: Secondary | ICD-10-CM | POA: Diagnosis not present

## 2018-05-22 DIAGNOSIS — K219 Gastro-esophageal reflux disease without esophagitis: Secondary | ICD-10-CM | POA: Diagnosis not present

## 2018-05-22 DIAGNOSIS — Z4789 Encounter for other orthopedic aftercare: Secondary | ICD-10-CM | POA: Diagnosis not present

## 2018-05-22 DIAGNOSIS — M109 Gout, unspecified: Secondary | ICD-10-CM | POA: Diagnosis not present

## 2018-05-22 DIAGNOSIS — M2550 Pain in unspecified joint: Secondary | ICD-10-CM | POA: Diagnosis not present

## 2018-05-22 DIAGNOSIS — M25562 Pain in left knee: Secondary | ICD-10-CM | POA: Diagnosis not present

## 2018-05-22 DIAGNOSIS — I4891 Unspecified atrial fibrillation: Secondary | ICD-10-CM | POA: Diagnosis not present

## 2018-05-22 DIAGNOSIS — N401 Enlarged prostate with lower urinary tract symptoms: Secondary | ICD-10-CM | POA: Diagnosis not present

## 2018-05-22 DIAGNOSIS — R7303 Prediabetes: Secondary | ICD-10-CM | POA: Diagnosis not present

## 2018-05-22 DIAGNOSIS — S82251D Displaced comminuted fracture of shaft of right tibia, subsequent encounter for closed fracture with routine healing: Secondary | ICD-10-CM | POA: Diagnosis not present

## 2018-05-22 DIAGNOSIS — E559 Vitamin D deficiency, unspecified: Secondary | ICD-10-CM | POA: Diagnosis not present

## 2018-05-22 DIAGNOSIS — W1839XA Other fall on same level, initial encounter: Secondary | ICD-10-CM | POA: Diagnosis not present

## 2018-05-22 DIAGNOSIS — J9610 Chronic respiratory failure, unspecified whether with hypoxia or hypercapnia: Secondary | ICD-10-CM | POA: Diagnosis not present

## 2018-05-22 DIAGNOSIS — M6281 Muscle weakness (generalized): Secondary | ICD-10-CM | POA: Diagnosis not present

## 2018-05-22 DIAGNOSIS — Y92009 Unspecified place in unspecified non-institutional (private) residence as the place of occurrence of the external cause: Secondary | ICD-10-CM | POA: Diagnosis not present

## 2018-05-22 DIAGNOSIS — Z9181 History of falling: Secondary | ICD-10-CM | POA: Diagnosis not present

## 2018-05-22 MED ORDER — LISINOPRIL 20 MG PO TABS: 20.0000 mg | ORAL_TABLET | Freq: Every day | ORAL | 0 refills | Status: AC

## 2018-05-22 MED ORDER — SIMETHICONE 80 MG PO CHEW: 80.0000 mg | CHEWABLE_TABLET | Freq: Four times a day (QID) | ORAL | Status: DC | PRN

## 2018-05-22 MED ORDER — SIMETHICONE 80 MG PO CHEW
80.0000 mg | CHEWABLE_TABLET | Freq: Once | ORAL | Status: AC
  Administered 2018-05-22: 80 mg via ORAL
  Filled 2018-05-22: qty 1

## 2018-05-22 MED ORDER — ACETAMINOPHEN 325 MG PO TABS: 325.0000 mg | ORAL_TABLET | Freq: Four times a day (QID) | ORAL | Status: AC | PRN

## 2018-05-22 MED ORDER — RIVAROXABAN 10 MG PO TABS: 10.0000 mg | ORAL_TABLET | Freq: Every day | ORAL | 0 refills | Status: AC

## 2018-05-22 NOTE — Plan of Care (Signed)

## 2018-05-22 NOTE — Progress Notes (Signed)
RN called and gave report to Lincoln Brigham, Therapist, sports.  Pt comfortable, RN will administer flu vaccine prior to D/C. PT signed off on the patient awaiting PTAR

## 2018-05-22 NOTE — Progress Notes (Signed)
   Subjective: Tracy Brandt reported feeling well today. He still complained of right and left knee pain but said the pain medications were helping. He also complained of feeling gassy today but was having normal bowel movements.   Objective:  Vital signs in last 24 hours: Vitals:   05/21/18 2013 05/22/18 0015 05/22/18 0446 05/22/18 0500  BP: (!) 167/87 (!) 148/83 (!) 154/86   Pulse: 73  (!) 39 89  Resp: 16  16   Temp: 98.3 F (36.8 C)  98.1 F (36.7 C)   TempSrc: Oral  Oral   SpO2: 100%  96%   Weight:      Height:       Physical Exam  Constitutional: He is oriented to person, place, and time and well-developed, well-nourished, and in no distress.  Cardiovascular:  Irregularly irregular  Pulmonary/Chest: Effort normal and breath sounds normal. No respiratory distress. He has no wheezes.  Musculoskeletal: He exhibits no edema.  Neurological: He is alert and oriented to person, place, and time.  Skin: Skin is warm and dry.    Assessment/Plan:  Principal Problem:   Closed right tibial fracture Active Problems:   Essential hypertension   Atrial fibrillation (HCC)   Fall   Left patella fracture   Postoperative pain   Acute blood loss anemia   Atrial fibrillation with rapid ventricular response (HCC)   Hyperlipidemia   Prediabetes   Tibial fracture  Tracy Brandt is an 84 yo male with a PMHx of afib not on anticoagulation due to fall risk, HTN, DJD of bilateral knees and chronic pain syndrome who presented after a mechanical fall and found to have a right tibial and fibula fracture and a left knee patellar fracture.   Closed right tibial fracture s/p IM nail  - POD 3; pain regimen with Norco/Vicodin q 6 with morphine for breakthrough - bowel regimen  - working with PT for early weightbearing; recommending SNF placement - Lovenox for DVT prophylaxis; plan to extend DVT prophylaxis with Xarelto or Eliquis on discharge depending on how much he is mobilizing  Left patella  fracture - being managed non-operatively with knee immobilizer   HTN BP 154/86 today.  - resumed lisinopril 20 mg qd and HCTZ 12.5 mg qd  - continue to hold minoxidil   Afib -resumed lower dose of Diltiazem at 120 mg daily  Dispo: Anticipated discharge is today.  Tracy Craze, DO 05/22/2018, 10:28 AM Pager: [phone]

## 2018-05-22 NOTE — Progress Notes (Signed)
Patient will DC to: [name] Anticipated DC date: 05/22/18 Family notified: daughter Claudie Fisherman by: Corey Harold  Per MD patient ready for DC to [name]. RN, patient, patient's family, and facility notified of DC. Discharge Summary sent to facility. RN given number for report [phone] Room 7013. DC packet on chart. Ambulance transport requested for patient.  CSW signing off.  Tracy Brandt, Tracy Brandt

## 2018-05-22 NOTE — Discharge Summary (Addendum)
a  Name: Tracy Brandt MRN: [id number] DOB: December 11, 1929 84 y.o. PCP: Unk Pinto, MD  Date of Admission: 05/18/2018  4:39 PM Date of Discharge: 05/22/18 Attending Physician: Annia Belt, MD  Discharge Diagnosis: 1. Mechanical fall 2. Right tibial and fibula fracture 3. Left knee patellar fracture  Discharge Medications: Allergies as of 05/22/2018   No Known Allergies     Medication List    STOP taking these medications   aspirin EC 81 MG tablet   minoxidil 10 MG tablet Commonly known as:  LONITEN     TAKE these medications   acetaminophen 325 MG tablet Commonly known as:  TYLENOL Take 1-2 tablets (325-650 mg total) by mouth every 6 (six) hours as needed for mild pain (pain score 1-3 or temp > 100.5).   bisoprolol-hydrochlorothiazide 10-6.25 MG tablet Commonly known as:  ZIAC Take 1/2 to 1 tablet daily for BP   diltiazem 240 MG 24 hr capsule Commonly known as:  CARDIZEM CD Take 240 mg by mouth daily. What changed:  Another medication with the same name was removed. Continue taking this medication, and follow the directions you see here.   docusate sodium 50 MG capsule Commonly known as:  COLACE Take 50 mg by mouth 2 (two) times daily as needed (to prevent constipation).   doxazosin 8 MG tablet Commonly known as:  CARDURA Take 8 mg by mouth at bedtime.   furosemide 40 MG tablet Commonly known as:  LASIX Take 1 tablet (40 mg total) by mouth daily.   hydrochlorothiazide 25 MG tablet Commonly known as:  HYDRODIURIL Take 12.5 mg by mouth daily.   HYDROcodone-acetaminophen 5-325 MG tablet Commonly known as:  NORCO/VICODIN Take 1-2 tablets by mouth every 6 (six) hours as needed for up to 5 days for moderate pain. What changed:    when to take this  reasons to take this   lisinopril 20 MG tablet Commonly known as:  PRINIVIL,ZESTRIL Take 1 tablet (20 mg total) by mouth daily. Start taking on:  05/23/2018 What changed:    medication  strength  See the new instructions.   Magnesium 250 MG Tabs Take 250 mg by mouth 2 (two) times daily.   meloxicam 15 MG tablet Commonly known as:  MOBIC Take 15 mg by mouth daily.   omeprazole 20 MG capsule Commonly known as:  PRILOSEC Take 20 mg by mouth daily before breakfast.   ondansetron 4 MG tablet Commonly known as:  ZOFRAN Take 1 tablet (4 mg total) by mouth every 6 (six) hours as needed.   polyethylene glycol powder powder Commonly known as:  GLYCOLAX/MIRALAX Take 17 g by mouth daily as needed for mild constipation or moderate constipation.   prochlorperazine 5 MG tablet Commonly known as:  COMPAZINE Take 1 tablet 3 to 4 x/day if needed for Nausea /Vomitting   rivaroxaban 10 MG Tabs tablet Commonly known as:  XARELTO Take 1 tablet (10 mg total) by mouth daily. Take once a day with food at the same time daily   sertraline 100 MG tablet Commonly known as:  ZOLOFT Take 100 mg by mouth daily.       Disposition and follow-up:   [name] was discharged from [facility] in Stable condition.  At the hospital follow up visit please address:  1.  Patient's response to therapy and response to xarelto for DVT prophylaxis. Also, there seems to be a question of whether the following medications are indicated: zoloft, doxazosin, lasix and magnesium. Please  address these medications with patient.   2.  Labs / imaging needed at time of follow-up: none  3.  Pending labs/ test needing follow-up: none  Follow-up Appointments:  Contact information for follow-up providers    Renette Butters, MD Follow up in 2 week(s).   Specialty:  Orthopedic Surgery Contact information: [address] [place] [phone]            Contact information for after-discharge care    Destination    [name] PREFERRED SNF/ALF .   Service:  Skilled Nursing Contact information: [address] [place] [facility] [place] [facility] Course by problem list: 1. Right tibial and fibular fracture- Patient presented after a mechanical fall and found to have a right tibial and fibula fracture. Orthopedics closed the right tibial fracture with an intramedullary (IM) tibial nail. Patient responded well and post-operatively the recovery was uneventful. Pain managed on norco/vicodin q6. Discharged on Xarelto 10 mg qd for DVT prophylaxis.  2. Left patellar fracture- This was treated non-operatively with a knee immobilizer and rehab.  3. HTN- Held home antihypertensives given hypotensive episodes likely in the setting of paroxysmal afib, new anemia and receiving his home medications. Resumed lisnopril 20 mg and HCTZ 12.5 mg on post op day 2.  3. A fib- Rate controlled on diltiazem. Resumed 1/2 home dose of diltiazem due to hypotension early in admission. Discharged on home dose.   Discharge Vitals:   BP (!) 153/92 (BP Location: Right Arm)   Pulse 92   Temp 98.5 F (36.9 C) (Oral)   Resp 16   Ht 5\' 11"  (1.803 m)   Wt 83.9 kg   SpO2 99%   BMI 25.80 kg/m   Pertinent Labs, Studies, and Procedures:   CBC Latest Ref Rng & Units 05/19/2018 05/19/2018 04/09/2018  WBC 4.0 - 10.5 K/uL 4.3 5.3 5.3  Hemoglobin 13.0 - 17.0 g/dL 10.6(L) 10.6(L) 13.7  Hematocrit 39.0 - 52.0 % 32.3(L) 32.9(L) 41.2  Platelets 150 - 400 K/uL 152 147(L) 204   CMP Latest Ref Rng & Units 05/19/2018 04/09/2018 12/25/2017  Glucose 70 - 99 mg/dL 119(H) 110(H) 100(H)  BUN 8 - 23 mg/dL 12 12 10   Creatinine 0.61 - 1.24 mg/dL 0.57(L) 0.55(L) 0.47(L)  Sodium 135 - 145 mmol/L 139 140 134(L)  Potassium 3.5 - 5.1 mmol/L 3.7 4.2 4.1  Chloride 98 - 111 mmol/L 106 102 95(L)  CO2 22 - 32 mmol/L 25 27 30   Calcium 8.9 - 10.3 mg/dL 8.4(L) 9.1 9.8  Total Protein 6.1 - 8.1 g/dL - 6.6 7.1  Total Bilirubin 0.2 - 1.2 mg/dL - 0.9 1.7(H)  Alkaline Phos 40 - 115 U/L - - -  AST 10 - 35 U/L - 15 16  ALT 9 - 46 U/L - 10  10   Dg Tibia/fibula Left  Result Date: 05/18/2018 CLINICAL DATA:  Fall, bilateral knee pain EXAM: LEFT TIBIA AND FIBULA - 2 VIEW COMPARISON:  None. FINDINGS: Minimally displaced inferior patellar fracture on the lateral view. Tibia and fibula are intact. Knee joint spaces are preserved. Mild prepatellar soft tissue swelling. IMPRESSION: Minimally displaced inferior patellar fracture on the lateral view. Electronically Signed   By: Julian Hy M.D.   On: 05/18/2018 18:59   Dg Tibia/fibula Right  Result Date: 05/19/2018 CLINICAL DATA:  Tibial fracture. EXAM: DG C-ARM 61-120 MIN; RIGHT TIBIA  AND FIBULA - 2 VIEW FLUOROSCOPY TIME:  43.4 seconds. COMPARISON:  Radiographs of May 18, 2018. FINDINGS: Four intraoperative fluoroscopic images demonstrate the patient be status post intramedullary rod fixation of proximal right tibial shaft fracture. Good alignment of fracture components is noted. Proximal fibular fracture is noted as well. IMPRESSION: Status post intramedullary rod of fixation of proximal right tibial shaft fracture. Electronically Signed   By: Marijo Conception, M.D.   On: 05/19/2018 15:29   Dg Tibia/fibula Right  Result Date: 05/18/2018 CLINICAL DATA:  Fall, bilateral knee pain EXAM: RIGHT TIBIA AND FIBULA - 2 VIEW COMPARISON:  None. FINDINGS: Transverse proximal tibial shaft fracture, nondisplaced. Mildly comminuted fibular shaft fracture, with mild apex posterior angulation and minimal displacement. Distal fibula/lateral malleolus is mildly irregular, although this appearance favors a chronic deformity. No definite ankle fracture. The visualized soft tissues are unremarkable. IMPRESSION: Proximal tibial and fibular shaft fractures, as above. Electronically Signed   By: Julian Hy M.D.   On: 05/18/2018 18:59   Dg Knee Complete 4 Views Left  Result Date: 05/18/2018 CLINICAL DATA:  Fall, bilateral knee pain EXAM: LEFT KNEE - COMPLETE 4+ VIEW COMPARISON:  None. FINDINGS:  Suspected comminuted patellar fracture with mildly displaced inferior fragment on the lateral view. Knee joint spaces are preserved. Moderate suprapatellar knee joint effusion. Mild prepatellar soft tissue swelling. IMPRESSION: Suspected comminuted patellar fracture, as above. Electronically Signed   By: Julian Hy M.D.   On: 05/18/2018 19:03   Dg Knee Complete 4 Views Right  Result Date: 05/18/2018 CLINICAL DATA:  Fall, bilateral knee pain EXAM: RIGHT KNEE - COMPLETE 4+ VIEW COMPARISON:  None. FINDINGS: Transverse proximal tibial shaft fracture. Mildly comminuted fibular shaft fracture with minimal displacement. Mild tricompartmental prominent in the medial compartment, with chondrocalcinosis. No definite suprapatellar knee joint effusion. Changes of the knee, most IMPRESSION: Proximal tibial and fibular shaft fractures, as above. Electronically Signed   By: Julian Hy M.D.   On: 05/18/2018 18:57   Dg C-arm 1-60 Min  Result Date: 05/19/2018 CLINICAL DATA:  Tibial fracture. EXAM: DG C-ARM 61-120 MIN; RIGHT TIBIA AND FIBULA - 2 VIEW FLUOROSCOPY TIME:  43.4 seconds. COMPARISON:  Radiographs of May 18, 2018. FINDINGS: Four intraoperative fluoroscopic images demonstrate the patient be status post intramedullary rod fixation of proximal right tibial shaft fracture. Good alignment of fracture components is noted. Proximal fibular fracture is noted as well. IMPRESSION: Status post intramedullary rod of fixation of proximal right tibial shaft fracture. Electronically Signed   By: Marijo Conception, M.D.   On: 05/19/2018 15:29   Dg Hips Bilat With Pelvis 3-4 Views  Result Date: 05/18/2018 CLINICAL DATA:  Fall, bilateral knee pain EXAM: DG HIP (WITH OR WITHOUT PELVIS) 3-4V BILAT COMPARISON:  None. FINDINGS: No fracture or dislocation is seen. Mild degenerative changes of the right hip. Left hip joint space is preserved. Visualized bony pelvis appears intact. IMPRESSION: No fracture or dislocation  is seen. Mild degenerative changes of the right hip. Electronically Signed   By: Julian Hy M.D.   On: 05/18/2018 19:00     Discharge Instructions: Discharge Instructions    Diet - low sodium heart healthy   Complete by:  As directed    Discharge instructions   Complete by:  As directed    Mr. Lewinski,   Please note the following changes to your medications:   -START taking Xarelto 10 mg once a day with food, at the same time daily -CONTINUE taking tylenol and hydrocodone-acetaminophen 5-325 mg  1-2 tablets every 6 hours as needed -DECREASE lisinopril to 20 mg once a day until you see your PCP -HOLD minoxidil   There is a question of whether or not you are taking the following medications: -Zoloft (sertraline) -Magnesium -Cardura (doxazosin) -Lasix (furosemide)   Please see your PCP to determine if these medications are still indicated. Continue the rest of your medications as prescribed.  Thank you for allowing [place] to be a part of your care!   Increase activity slowly   Complete by:  As directed       Signed: Rehman, Areeg N, DO 05/22/2018, 11:11 AM   Pager: [phone]

## 2018-05-22 NOTE — Clinical Social Work Placement (Signed)
   CLINICAL SOCIAL WORK PLACEMENT  NOTE  Date:  05/22/2018  Patient Details  Name: Tracy Brandt MRN: [id number] Date of Birth: 04/08/1930  Clinical Social Work is seeking post-discharge placement for this patient at the [place] level of care (*CSW will initial, date and re-position this form in  chart as items are completed):  Yes   Patient/family provided with [place] Work Department's list of facilities offering this level of care within the geographic area requested by the patient (or if unable, by the patient's family).  Yes   Patient/family informed of their freedom to choose among providers that offer the needed level of care, that participate in Medicare, Medicaid or managed care program needed by the patient, have an available bed and are willing to accept the patient.      Patient/family informed of 's ownership interest in [facility] and [facility], as well as of the fact that they are under no obligation to receive care at these facilities.  PASRR submitted to EDS on       PASRR number received on 05/21/18     Existing PASRR number confirmed on       FL2 transmitted to all facilities in geographic area requested by pt/family on 05/21/18     FL2 transmitted to all facilities within larger geographic area on       Patient informed that his/her managed care company has contracts with or will negotiate with certain facilities, including the following:        Yes   Patient/family informed of bed offers received.  Patient chooses bed at [facility] [place] recommends and patient chooses bed at [facility]    Patient to be transferred to [facility] [place] on 05/22/18.  Patient to be transferred to facility by PTAR     Patient family notified on 05/22/18 of transfer.  Name of family member notified:  Anderson Malta Daughter     PHYSICIAN       Additional Comment:     _______________________________________________ Alberteen Sam, LCSW 05/22/2018, 11:27 AM

## 2018-05-23 DIAGNOSIS — R2689 Other abnormalities of gait and mobility: Secondary | ICD-10-CM | POA: Diagnosis not present

## 2018-05-23 DIAGNOSIS — G8911 Acute pain due to trauma: Secondary | ICD-10-CM | POA: Diagnosis not present

## 2018-05-23 DIAGNOSIS — M25562 Pain in left knee: Secondary | ICD-10-CM | POA: Diagnosis not present

## 2018-05-23 DIAGNOSIS — M79661 Pain in right lower leg: Secondary | ICD-10-CM | POA: Diagnosis not present

## 2018-05-24 DIAGNOSIS — I4891 Unspecified atrial fibrillation: Secondary | ICD-10-CM | POA: Diagnosis not present

## 2018-05-24 DIAGNOSIS — I951 Orthostatic hypotension: Secondary | ICD-10-CM | POA: Diagnosis not present

## 2018-05-24 DIAGNOSIS — S82002D Unspecified fracture of left patella, subsequent encounter for closed fracture with routine healing: Secondary | ICD-10-CM | POA: Diagnosis not present

## 2018-05-24 DIAGNOSIS — S82201D Unspecified fracture of shaft of right tibia, subsequent encounter for closed fracture with routine healing: Secondary | ICD-10-CM | POA: Diagnosis not present

## 2018-05-27 DIAGNOSIS — I4891 Unspecified atrial fibrillation: Secondary | ICD-10-CM | POA: Diagnosis not present

## 2018-05-27 DIAGNOSIS — M6281 Muscle weakness (generalized): Secondary | ICD-10-CM | POA: Diagnosis not present

## 2018-05-27 DIAGNOSIS — I509 Heart failure, unspecified: Secondary | ICD-10-CM | POA: Diagnosis not present

## 2018-05-27 DIAGNOSIS — N183 Chronic kidney disease, stage 3 (moderate): Secondary | ICD-10-CM | POA: Diagnosis not present

## 2018-05-27 DIAGNOSIS — J9610 Chronic respiratory failure, unspecified whether with hypoxia or hypercapnia: Secondary | ICD-10-CM | POA: Diagnosis not present

## 2018-06-07 DIAGNOSIS — S82002D Unspecified fracture of left patella, subsequent encounter for closed fracture with routine healing: Secondary | ICD-10-CM | POA: Diagnosis not present

## 2018-06-07 DIAGNOSIS — S82251D Displaced comminuted fracture of shaft of right tibia, subsequent encounter for closed fracture with routine healing: Secondary | ICD-10-CM | POA: Diagnosis not present

## 2018-07-02 DIAGNOSIS — F339 Major depressive disorder, recurrent, unspecified: Secondary | ICD-10-CM | POA: Diagnosis not present

## 2018-07-02 DIAGNOSIS — F4323 Adjustment disorder with mixed anxiety and depressed mood: Secondary | ICD-10-CM | POA: Diagnosis not present

## 2018-07-02 DIAGNOSIS — F411 Generalized anxiety disorder: Secondary | ICD-10-CM | POA: Diagnosis not present

## 2018-07-05 DIAGNOSIS — S82251D Displaced comminuted fracture of shaft of right tibia, subsequent encounter for closed fracture with routine healing: Secondary | ICD-10-CM | POA: Diagnosis not present

## 2018-07-05 DIAGNOSIS — S82002D Unspecified fracture of left patella, subsequent encounter for closed fracture with routine healing: Secondary | ICD-10-CM | POA: Diagnosis not present

## 2018-07-15 NOTE — Progress Notes (Deleted)
FOLLOW UP  Assessment and Plan:   A. Fib Rate controlled today Continue xarelto per discharge recommendations, some concern due to high fall risk Reminded to present to ED for any falls  Followed by cardiology  Hypertension Well controlled with current medications  Monitor blood pressure at home; patient to call if consistently greater than 130/80 Continue DASH diet.   Reminder to go to the ER if any CP, SOB, nausea, dizziness, severe HA, changes vision/speech, left arm numbness and tingling and jaw pain.  Cholesterol Currently at goal off of medication;  Continue low cholesterol diet and exercise.  Defer lipid panel.   Other abnormal glucose Recent A1Cs at goal Discussed diet/exercise, weight management  Defer A1C; check CMP  BMI 25 Continue to recommend diet heavy in fruits and veggies and low in animal meats, cheeses, and dairy products, appropriate calorie intake Discuss exercise recommendations routinely Continue to monitor weight at each visit  Vitamin D Def Below goal at last visit; he has *** increased dose continue supplementation to maintain goal of 70-100 Defer Vit D level  Continue diet and meds as discussed. Further disposition pending results of labs. Discussed med's effects and SE's.   Over 30 minutes of exam, counseling, chart review, and critical decision making was performed.   Future Appointments  Date Time Provider [place]  07/16/2018  8:45 AM Liane Comber, NP [facility] None  10/17/2018  9:30 AM Unk Pinto, MD [facility] None  01/10/2019 10:15 AM Liane Comber, NP [facility] None  05/12/2019 10:00 AM Unk Pinto, MD [facility] None    ----------------------------------------------------------------------------------------------------------------------  HPI 84 y.o. male  presents for 3 month follow up on hypertension, cholesterol, glucose management, weight and vitamin D deficiency. Patient has a multifactorial unstable  gait attributed to bilat knee pains and a Peripheral Neuropathy with an undefined Balance Disorder with negative CT Head scan, PNCV's, EMG and negative serology for Myasthena Gravis and  is on a scooter from the [place]. Patient also has cAfib with coumadin deferred due to his unstable gait and high fall risk but alternatively has been started on pradaxa by the [facility], later transitioned to [employer]. [facility]. He recently fell resulting in R tib/fib/patellar fracture with surgical repair by Dr. Percell Miller on 05/18/2018.   ***  BMI is There is no height or weight on file to calculate BMI., he {HAS HAS URK:27062} been working on diet and exercise. Wt Readings from Last 3 Encounters:  05/18/18 185 lb (83.9 kg)  04/09/18 195 lb 3.2 oz (88.5 kg)  12/25/17 185 lb (83.9 kg)   His blood pressure {HAS HAS NOT:18834} been controlled at home, today their BP is    He {DOES_DOES BJS:28315} workout. He denies chest pain, shortness of breath, dizziness.   He is not on cholesterol medication secondary to age. His cholesterol is at goal. The cholesterol last visit was:   Lab Results  Component Value Date   CHOL 162 04/09/2018   HDL 76 04/09/2018   LDLCALC 71 04/09/2018   TRIG 73 04/09/2018   CHOLHDL 2.1 04/09/2018    He {Has/has not:18111} been working on diet and exercise for glucose management, and denies {Symptoms; diabetes w/o none:19199}. Last A1C in the office was:  Lab Results  Component Value Date   HGBA1C 5.4 04/09/2018   Patient is on Vitamin D supplement.   Lab Results  Component Value Date   VD25OH 54 04/09/2018        Current Medications:  Current Outpatient Medications on  File Prior to Visit  Medication Sig  . acetaminophen (TYLENOL) 325 MG tablet Take 1-2 tablets (325-650 mg total) by mouth every 6 (six) hours as needed for mild pain (pain score 1-3 or temp > 100.5).  . bisoprolol-hydrochlorothiazide (ZIAC) 10-6.25 MG tablet Take 1/2 to 1 tablet daily for BP  (Patient not taking: Reported on 05/18/2018)  . diltiazem (CARDIZEM CD) 240 MG 24 hr capsule Take 240 mg by mouth daily.  Marland Kitchen docusate sodium (COLACE) 50 MG capsule Take 50 mg by mouth 2 (two) times daily as needed (to prevent constipation).  Marland Kitchen doxazosin (CARDURA) 8 MG tablet Take 8 mg by mouth at bedtime.  . furosemide (LASIX) 40 MG tablet Take 1 tablet (40 mg total) by mouth daily. (Patient not taking: Reported on 05/18/2018)  . hydrochlorothiazide (HYDRODIURIL) 25 MG tablet Take 12.5 mg by mouth daily.  Marland Kitchen lisinopril (PRINIVIL,ZESTRIL) 20 MG tablet Take 1 tablet (20 mg total) by mouth daily.  . Magnesium 250 MG TABS Take 250 mg by mouth 2 (two) times daily.   . meloxicam (MOBIC) 15 MG tablet Take 15 mg by mouth daily.  Marland Kitchen omeprazole (PRILOSEC) 20 MG capsule Take 20 mg by mouth daily before breakfast.  . ondansetron (ZOFRAN) 4 MG tablet Take 1 tablet (4 mg total) by mouth every 6 (six) hours as needed. (Patient not taking: Reported on 05/18/2018)  . polyethylene glycol powder (GLYCOLAX/MIRALAX) powder Take 17 g by mouth daily as needed for mild constipation or moderate constipation.  . prochlorperazine (COMPAZINE) 5 MG tablet Take 1 tablet 3 to 4 x/day if needed for Nausea /Vomitting (Patient not taking: Reported on 05/18/2018)  . rivaroxaban (XARELTO) 10 MG TABS tablet Take 1 tablet (10 mg total) by mouth daily. Take once a day with food at the same time daily  . sertraline (ZOLOFT) 100 MG tablet Take 100 mg by mouth daily.   No current facility-administered medications on file prior to visit.      Allergies: No Known Allergies   Medical History:  Past Medical History:  Diagnosis Date  . Arthritis   . BPH (benign prostatic hyperplasia)   . GERD (gastroesophageal reflux disease)   . Gout   . Hyperlipidemia   . Hypertension   . Prediabetes   . Vitamin D deficiency    Family history- Reviewed and unchanged Social history- Reviewed and unchanged   Review of Systems:  Review of Systems   Constitutional: Negative for malaise/fatigue and weight loss.  HENT: Negative for hearing loss and tinnitus.   Eyes: Negative for blurred vision and double vision.  Respiratory: Negative for cough, shortness of breath and wheezing.   Cardiovascular: Negative for chest pain, palpitations, orthopnea, claudication and leg swelling.  Gastrointestinal: Negative for abdominal pain, blood in stool, constipation, diarrhea, heartburn, melena, nausea and vomiting.  Genitourinary: Negative.   Musculoskeletal: Negative for joint pain and myalgias.  Skin: Negative for rash.  Neurological: Negative for dizziness, tingling, sensory change, weakness and headaches.  Endo/Heme/Allergies: Negative for polydipsia.  Psychiatric/Behavioral: Negative.   All other systems reviewed and are negative.     Physical Exam: There were no vitals taken for this visit. Wt Readings from Last 3 Encounters:  05/18/18 185 lb (83.9 kg)  04/09/18 195 lb 3.2 oz (88.5 kg)  12/25/17 185 lb (83.9 kg)   General Appearance: Well nourished, in no apparent distress. Eyes: PERRLA, EOMs, conjunctiva no swelling or erythema Sinuses: No Frontal/maxillary tenderness ENT/Mouth: Ext aud canals clear, TMs without erythema, bulging. No erythema, swelling, or exudate  on post pharynx.  Tonsils not swollen or erythematous. Hearing normal.  Neck: Supple, thyroid normal.  Respiratory: Respiratory effort normal, BS equal bilaterally without rales, rhonchi, wheezing or stridor.  Cardio: RRR with no MRGs. Brisk peripheral pulses without edema.  Abdomen: Soft, + BS.  Non tender, no guarding, rebound, hernias, masses. Lymphatics: Non tender without lymphadenopathy.  Musculoskeletal: Full ROM, 5/5 strength, {PSY - GAIT AND STATION:22860} gait Skin: Warm, dry without rashes, lesions, ecchymosis.  Neuro: Cranial nerves intact. No cerebellar symptoms.  Psych: Awake and oriented X 3, normal affect, Insight and Judgment appropriate.    Izora Ribas, NP 8:01 AM [facility]

## 2018-07-16 ENCOUNTER — Ambulatory Visit: Payer: Self-pay | Admitting: Adult Health

## 2018-07-17 DIAGNOSIS — Z9181 History of falling: Secondary | ICD-10-CM | POA: Diagnosis not present

## 2018-07-17 DIAGNOSIS — D649 Anemia, unspecified: Secondary | ICD-10-CM | POA: Diagnosis not present

## 2018-07-17 DIAGNOSIS — I4891 Unspecified atrial fibrillation: Secondary | ICD-10-CM | POA: Diagnosis not present

## 2018-07-17 DIAGNOSIS — K219 Gastro-esophageal reflux disease without esophagitis: Secondary | ICD-10-CM | POA: Diagnosis not present

## 2018-07-25 DIAGNOSIS — G8928 Other chronic postprocedural pain: Secondary | ICD-10-CM | POA: Diagnosis not present

## 2018-07-25 DIAGNOSIS — G8929 Other chronic pain: Secondary | ICD-10-CM | POA: Diagnosis not present

## 2018-07-25 DIAGNOSIS — M150 Primary generalized (osteo)arthritis: Secondary | ICD-10-CM | POA: Diagnosis not present

## 2018-08-02 DIAGNOSIS — Z9181 History of falling: Secondary | ICD-10-CM | POA: Diagnosis not present

## 2018-08-02 DIAGNOSIS — D509 Iron deficiency anemia, unspecified: Secondary | ICD-10-CM | POA: Diagnosis not present

## 2018-08-02 DIAGNOSIS — M84369D Stress fracture, unspecified tibia and fibula, subsequent encounter for fracture with routine healing: Secondary | ICD-10-CM | POA: Diagnosis not present

## 2018-08-02 DIAGNOSIS — I4891 Unspecified atrial fibrillation: Secondary | ICD-10-CM | POA: Diagnosis not present

## 2018-08-05 DIAGNOSIS — M25561 Pain in right knee: Secondary | ICD-10-CM | POA: Diagnosis not present

## 2018-08-05 DIAGNOSIS — M25571 Pain in right ankle and joints of right foot: Secondary | ICD-10-CM | POA: Diagnosis not present

## 2018-08-05 DIAGNOSIS — M79661 Pain in right lower leg: Secondary | ICD-10-CM | POA: Diagnosis not present

## 2018-08-05 DIAGNOSIS — M25572 Pain in left ankle and joints of left foot: Secondary | ICD-10-CM | POA: Diagnosis not present

## 2018-08-05 DIAGNOSIS — M25562 Pain in left knee: Secondary | ICD-10-CM | POA: Diagnosis not present

## 2018-08-05 DIAGNOSIS — M79662 Pain in left lower leg: Secondary | ICD-10-CM | POA: Diagnosis not present

## 2018-08-28 DIAGNOSIS — S82251D Displaced comminuted fracture of shaft of right tibia, subsequent encounter for closed fracture with routine healing: Secondary | ICD-10-CM | POA: Diagnosis not present

## 2018-08-28 DIAGNOSIS — S82002D Unspecified fracture of left patella, subsequent encounter for closed fracture with routine healing: Secondary | ICD-10-CM | POA: Diagnosis not present

## 2018-09-09 DIAGNOSIS — R600 Localized edema: Secondary | ICD-10-CM | POA: Diagnosis not present

## 2018-09-09 DIAGNOSIS — M79645 Pain in left finger(s): Secondary | ICD-10-CM | POA: Diagnosis not present

## 2018-09-28 DIAGNOSIS — F339 Major depressive disorder, recurrent, unspecified: Secondary | ICD-10-CM | POA: Diagnosis not present

## 2018-09-30 DIAGNOSIS — N189 Chronic kidney disease, unspecified: Secondary | ICD-10-CM | POA: Diagnosis not present

## 2018-09-30 DIAGNOSIS — M109 Gout, unspecified: Secondary | ICD-10-CM | POA: Diagnosis not present

## 2018-09-30 DIAGNOSIS — M25522 Pain in left elbow: Secondary | ICD-10-CM | POA: Diagnosis not present

## 2018-10-07 DIAGNOSIS — B351 Tinea unguium: Secondary | ICD-10-CM | POA: Diagnosis not present

## 2018-10-07 DIAGNOSIS — M79675 Pain in left toe(s): Secondary | ICD-10-CM | POA: Diagnosis not present

## 2018-10-07 DIAGNOSIS — M79674 Pain in right toe(s): Secondary | ICD-10-CM | POA: Diagnosis not present

## 2018-10-17 ENCOUNTER — Encounter: Payer: Self-pay | Admitting: Internal Medicine

## 2018-10-17 ENCOUNTER — Ambulatory Visit (INDEPENDENT_AMBULATORY_CARE_PROVIDER_SITE_OTHER): Payer: Medicare Other | Admitting: Internal Medicine

## 2018-10-17 DIAGNOSIS — K219 Gastro-esophageal reflux disease without esophagitis: Secondary | ICD-10-CM

## 2018-10-17 DIAGNOSIS — E559 Vitamin D deficiency, unspecified: Secondary | ICD-10-CM

## 2018-10-17 DIAGNOSIS — E782 Mixed hyperlipidemia: Secondary | ICD-10-CM

## 2018-10-17 DIAGNOSIS — I482 Chronic atrial fibrillation, unspecified: Secondary | ICD-10-CM

## 2018-10-17 DIAGNOSIS — R7309 Other abnormal glucose: Secondary | ICD-10-CM

## 2018-10-17 DIAGNOSIS — Z5329 Procedure and treatment not carried out because of patient's decision for other reasons: Secondary | ICD-10-CM

## 2018-10-17 DIAGNOSIS — I10 Essential (primary) hypertension: Secondary | ICD-10-CM

## 2018-10-17 DIAGNOSIS — R7303 Prediabetes: Secondary | ICD-10-CM

## 2018-10-17 NOTE — Progress Notes (Signed)
     N  O         S  H  O  W                     This very nice 85 y.o. WWM presents for 6 month follow up with HTN, HLD, Pre-Diabetes and Vitamin D Deficiency.      Patient is treated for HTN & BP has been controlled at home. Today's  . Patient has had no complaints of any cardiac type chest pain, palpitations, dyspnea / orthopnea / PND, dizziness, claudication, or dependent edema.     Hyperlipidemia is controlled with diet & meds. Patient denies myalgias or other med SE's. Last Lipids were  Lab Results  Component Value Date   CHOL 162 04/09/2018   HDL 76 04/09/2018   LDLCALC 71 04/09/2018   TRIG 73 04/09/2018   CHOLHDL 2.1 04/09/2018      Also, the patient has history of PreDiabetes and has had no symptoms of reactive hypoglycemia, diabetic polys, paresthesias or visual blurring.  Last A1c was  Lab Results  Component Value Date   HGBA1C 5.4 04/09/2018      Further, the patient also has history of Vitamin D Deficiency and supplements vitamin D without any suspected side-effects. Last vitamin D was   Lab Results  Component Value Date   VD25OH 54 04/09/2018

## 2018-11-08 DIAGNOSIS — I480 Paroxysmal atrial fibrillation: Secondary | ICD-10-CM | POA: Diagnosis not present

## 2018-11-08 DIAGNOSIS — D509 Iron deficiency anemia, unspecified: Secondary | ICD-10-CM | POA: Diagnosis not present

## 2018-11-08 DIAGNOSIS — Z7409 Other reduced mobility: Secondary | ICD-10-CM | POA: Diagnosis not present

## 2018-11-08 DIAGNOSIS — I10 Essential (primary) hypertension: Secondary | ICD-10-CM | POA: Diagnosis not present

## 2018-11-08 DIAGNOSIS — Z7901 Long term (current) use of anticoagulants: Secondary | ICD-10-CM | POA: Diagnosis not present

## 2018-11-08 DIAGNOSIS — K219 Gastro-esophageal reflux disease without esophagitis: Secondary | ICD-10-CM | POA: Diagnosis not present

## 2018-12-23 DIAGNOSIS — M25572 Pain in left ankle and joints of left foot: Secondary | ICD-10-CM | POA: Diagnosis not present

## 2018-12-23 DIAGNOSIS — Z8739 Personal history of other diseases of the musculoskeletal system and connective tissue: Secondary | ICD-10-CM | POA: Diagnosis not present

## 2018-12-23 DIAGNOSIS — G8929 Other chronic pain: Secondary | ICD-10-CM | POA: Diagnosis not present

## 2019-01-02 DIAGNOSIS — Z8739 Personal history of other diseases of the musculoskeletal system and connective tissue: Secondary | ICD-10-CM | POA: Diagnosis not present

## 2019-01-02 DIAGNOSIS — I482 Chronic atrial fibrillation, unspecified: Secondary | ICD-10-CM | POA: Diagnosis not present

## 2019-01-02 DIAGNOSIS — R609 Edema, unspecified: Secondary | ICD-10-CM | POA: Diagnosis not present

## 2019-01-02 DIAGNOSIS — I10 Essential (primary) hypertension: Secondary | ICD-10-CM | POA: Diagnosis not present

## 2019-01-02 DIAGNOSIS — D649 Anemia, unspecified: Secondary | ICD-10-CM | POA: Diagnosis not present

## 2019-01-02 DIAGNOSIS — I959 Hypotension, unspecified: Secondary | ICD-10-CM | POA: Diagnosis not present

## 2019-01-02 DIAGNOSIS — Z7409 Other reduced mobility: Secondary | ICD-10-CM | POA: Diagnosis not present

## 2019-01-02 DIAGNOSIS — K219 Gastro-esophageal reflux disease without esophagitis: Secondary | ICD-10-CM | POA: Diagnosis not present

## 2019-01-07 DIAGNOSIS — F339 Major depressive disorder, recurrent, unspecified: Secondary | ICD-10-CM | POA: Diagnosis not present

## 2019-01-07 DIAGNOSIS — F411 Generalized anxiety disorder: Secondary | ICD-10-CM | POA: Diagnosis not present

## 2019-01-10 ENCOUNTER — Ambulatory Visit: Payer: Self-pay | Admitting: Adult Health

## 2019-01-27 DIAGNOSIS — Z8739 Personal history of other diseases of the musculoskeletal system and connective tissue: Secondary | ICD-10-CM | POA: Diagnosis not present

## 2019-01-27 DIAGNOSIS — Z7409 Other reduced mobility: Secondary | ICD-10-CM | POA: Diagnosis not present

## 2019-01-27 DIAGNOSIS — R52 Pain, unspecified: Secondary | ICD-10-CM | POA: Diagnosis not present

## 2019-02-06 DIAGNOSIS — G8929 Other chronic pain: Secondary | ICD-10-CM | POA: Diagnosis not present

## 2019-02-06 DIAGNOSIS — B369 Superficial mycosis, unspecified: Secondary | ICD-10-CM | POA: Diagnosis not present

## 2019-02-06 DIAGNOSIS — L989 Disorder of the skin and subcutaneous tissue, unspecified: Secondary | ICD-10-CM | POA: Diagnosis not present

## 2019-03-03 DIAGNOSIS — Z7409 Other reduced mobility: Secondary | ICD-10-CM | POA: Diagnosis not present

## 2019-03-03 DIAGNOSIS — R609 Edema, unspecified: Secondary | ICD-10-CM | POA: Diagnosis not present

## 2019-03-03 DIAGNOSIS — L989 Disorder of the skin and subcutaneous tissue, unspecified: Secondary | ICD-10-CM | POA: Diagnosis not present

## 2019-03-10 DIAGNOSIS — I10 Essential (primary) hypertension: Secondary | ICD-10-CM | POA: Diagnosis not present

## 2019-03-10 DIAGNOSIS — I482 Chronic atrial fibrillation, unspecified: Secondary | ICD-10-CM | POA: Diagnosis not present

## 2019-03-10 DIAGNOSIS — G894 Chronic pain syndrome: Secondary | ICD-10-CM | POA: Diagnosis not present

## 2019-03-10 DIAGNOSIS — F339 Major depressive disorder, recurrent, unspecified: Secondary | ICD-10-CM | POA: Diagnosis not present

## 2019-03-17 DIAGNOSIS — R609 Edema, unspecified: Secondary | ICD-10-CM | POA: Diagnosis not present

## 2019-03-17 DIAGNOSIS — Z136 Encounter for screening for cardiovascular disorders: Secondary | ICD-10-CM | POA: Diagnosis not present

## 2019-03-17 DIAGNOSIS — M79604 Pain in right leg: Secondary | ICD-10-CM | POA: Diagnosis not present

## 2019-03-17 DIAGNOSIS — L539 Erythematous condition, unspecified: Secondary | ICD-10-CM | POA: Diagnosis not present

## 2019-03-25 ENCOUNTER — Telehealth: Payer: Self-pay | Admitting: Cardiovascular Disease

## 2019-03-25 NOTE — Telephone Encounter (Signed)
Recall - Patient says he will go to [place] if needed

## 2019-04-09 DIAGNOSIS — S91301A Unspecified open wound, right foot, initial encounter: Secondary | ICD-10-CM | POA: Diagnosis not present

## 2019-04-09 DIAGNOSIS — M79671 Pain in right foot: Secondary | ICD-10-CM | POA: Diagnosis not present

## 2019-05-05 DIAGNOSIS — M109 Gout, unspecified: Secondary | ICD-10-CM | POA: Diagnosis not present

## 2019-05-06 DIAGNOSIS — G4700 Insomnia, unspecified: Secondary | ICD-10-CM | POA: Diagnosis not present

## 2019-05-06 DIAGNOSIS — F339 Major depressive disorder, recurrent, unspecified: Secondary | ICD-10-CM | POA: Diagnosis not present

## 2019-05-08 DIAGNOSIS — G8929 Other chronic pain: Secondary | ICD-10-CM | POA: Diagnosis not present

## 2019-05-08 DIAGNOSIS — I482 Chronic atrial fibrillation, unspecified: Secondary | ICD-10-CM | POA: Diagnosis not present

## 2019-05-08 DIAGNOSIS — I10 Essential (primary) hypertension: Secondary | ICD-10-CM | POA: Diagnosis not present

## 2019-05-08 DIAGNOSIS — M109 Gout, unspecified: Secondary | ICD-10-CM | POA: Diagnosis not present

## 2019-05-08 DIAGNOSIS — R609 Edema, unspecified: Secondary | ICD-10-CM | POA: Diagnosis not present

## 2019-05-08 DIAGNOSIS — K219 Gastro-esophageal reflux disease without esophagitis: Secondary | ICD-10-CM | POA: Diagnosis not present

## 2019-05-08 DIAGNOSIS — F329 Major depressive disorder, single episode, unspecified: Secondary | ICD-10-CM | POA: Diagnosis not present

## 2019-05-12 ENCOUNTER — Encounter: Payer: Self-pay | Admitting: Internal Medicine

## 2019-05-20 DIAGNOSIS — F339 Major depressive disorder, recurrent, unspecified: Secondary | ICD-10-CM | POA: Diagnosis not present

## 2019-05-20 DIAGNOSIS — G4700 Insomnia, unspecified: Secondary | ICD-10-CM | POA: Diagnosis not present

## 2019-06-09 DIAGNOSIS — M25512 Pain in left shoulder: Secondary | ICD-10-CM | POA: Diagnosis not present

## 2019-06-09 DIAGNOSIS — G8929 Other chronic pain: Secondary | ICD-10-CM | POA: Diagnosis not present

## 2019-06-09 DIAGNOSIS — Z8739 Personal history of other diseases of the musculoskeletal system and connective tissue: Secondary | ICD-10-CM | POA: Diagnosis not present

## 2019-06-09 DIAGNOSIS — Z9181 History of falling: Secondary | ICD-10-CM | POA: Diagnosis not present

## 2019-06-20 DIAGNOSIS — G8929 Other chronic pain: Secondary | ICD-10-CM | POA: Diagnosis not present

## 2019-06-20 DIAGNOSIS — M25512 Pain in left shoulder: Secondary | ICD-10-CM | POA: Diagnosis not present

## 2019-07-04 DIAGNOSIS — I10 Essential (primary) hypertension: Secondary | ICD-10-CM | POA: Diagnosis not present

## 2019-07-04 DIAGNOSIS — I482 Chronic atrial fibrillation, unspecified: Secondary | ICD-10-CM | POA: Diagnosis not present

## 2019-07-04 DIAGNOSIS — F5101 Primary insomnia: Secondary | ICD-10-CM | POA: Diagnosis not present

## 2019-07-04 DIAGNOSIS — F339 Major depressive disorder, recurrent, unspecified: Secondary | ICD-10-CM | POA: Diagnosis not present

## 2019-07-10 DIAGNOSIS — M25512 Pain in left shoulder: Secondary | ICD-10-CM | POA: Diagnosis not present

## 2019-07-10 DIAGNOSIS — Z7409 Other reduced mobility: Secondary | ICD-10-CM | POA: Diagnosis not present

## 2019-07-10 DIAGNOSIS — G8929 Other chronic pain: Secondary | ICD-10-CM | POA: Diagnosis not present

## 2019-07-10 DIAGNOSIS — Z741 Need for assistance with personal care: Secondary | ICD-10-CM | POA: Diagnosis not present

## 2019-07-10 DIAGNOSIS — M19012 Primary osteoarthritis, left shoulder: Secondary | ICD-10-CM | POA: Diagnosis not present

## 2019-07-29 DIAGNOSIS — F339 Major depressive disorder, recurrent, unspecified: Secondary | ICD-10-CM | POA: Diagnosis not present

## 2019-07-29 DIAGNOSIS — G4700 Insomnia, unspecified: Secondary | ICD-10-CM | POA: Diagnosis not present

## 2019-08-19 DIAGNOSIS — Z23 Encounter for immunization: Secondary | ICD-10-CM | POA: Diagnosis not present

## 2019-09-16 DIAGNOSIS — Z23 Encounter for immunization: Secondary | ICD-10-CM | POA: Diagnosis not present

## 2019-09-19 DIAGNOSIS — R609 Edema, unspecified: Secondary | ICD-10-CM | POA: Diagnosis not present

## 2019-09-19 DIAGNOSIS — F329 Major depressive disorder, single episode, unspecified: Secondary | ICD-10-CM | POA: Diagnosis not present

## 2019-09-19 DIAGNOSIS — I4891 Unspecified atrial fibrillation: Secondary | ICD-10-CM | POA: Diagnosis not present

## 2019-09-19 DIAGNOSIS — G4700 Insomnia, unspecified: Secondary | ICD-10-CM | POA: Diagnosis not present

## 2019-09-19 DIAGNOSIS — R52 Pain, unspecified: Secondary | ICD-10-CM | POA: Diagnosis not present

## 2019-09-19 DIAGNOSIS — Z8739 Personal history of other diseases of the musculoskeletal system and connective tissue: Secondary | ICD-10-CM | POA: Diagnosis not present

## 2019-09-19 DIAGNOSIS — K219 Gastro-esophageal reflux disease without esophagitis: Secondary | ICD-10-CM | POA: Diagnosis not present

## 2019-09-19 DIAGNOSIS — I10 Essential (primary) hypertension: Secondary | ICD-10-CM | POA: Diagnosis not present

## 2019-10-13 DIAGNOSIS — S81801D Unspecified open wound, right lower leg, subsequent encounter: Secondary | ICD-10-CM | POA: Diagnosis not present

## 2019-10-13 DIAGNOSIS — R609 Edema, unspecified: Secondary | ICD-10-CM | POA: Diagnosis not present

## 2019-11-04 DIAGNOSIS — F329 Major depressive disorder, single episode, unspecified: Secondary | ICD-10-CM | POA: Diagnosis not present

## 2019-11-04 DIAGNOSIS — F4310 Post-traumatic stress disorder, unspecified: Secondary | ICD-10-CM | POA: Diagnosis not present

## 2019-11-10 DIAGNOSIS — I10 Essential (primary) hypertension: Secondary | ICD-10-CM | POA: Diagnosis not present

## 2019-11-10 DIAGNOSIS — I482 Chronic atrial fibrillation, unspecified: Secondary | ICD-10-CM | POA: Diagnosis not present

## 2019-11-10 DIAGNOSIS — F339 Major depressive disorder, recurrent, unspecified: Secondary | ICD-10-CM | POA: Diagnosis not present

## 2019-11-10 DIAGNOSIS — G8929 Other chronic pain: Secondary | ICD-10-CM | POA: Diagnosis not present

## 2019-11-13 DIAGNOSIS — M19012 Primary osteoarthritis, left shoulder: Secondary | ICD-10-CM | POA: Diagnosis not present

## 2019-11-13 DIAGNOSIS — M25512 Pain in left shoulder: Secondary | ICD-10-CM | POA: Diagnosis not present

## 2019-11-13 DIAGNOSIS — G8929 Other chronic pain: Secondary | ICD-10-CM | POA: Diagnosis not present

## 2019-11-27 DIAGNOSIS — M19012 Primary osteoarthritis, left shoulder: Secondary | ICD-10-CM | POA: Diagnosis not present

## 2019-11-27 DIAGNOSIS — G8929 Other chronic pain: Secondary | ICD-10-CM | POA: Diagnosis not present

## 2019-12-02 DIAGNOSIS — F329 Major depressive disorder, single episode, unspecified: Secondary | ICD-10-CM | POA: Diagnosis not present

## 2019-12-02 DIAGNOSIS — R4586 Emotional lability: Secondary | ICD-10-CM | POA: Diagnosis not present

## 2019-12-02 DIAGNOSIS — F4310 Post-traumatic stress disorder, unspecified: Secondary | ICD-10-CM | POA: Diagnosis not present

## 2019-12-16 DIAGNOSIS — F329 Major depressive disorder, single episode, unspecified: Secondary | ICD-10-CM | POA: Diagnosis not present

## 2019-12-16 DIAGNOSIS — G4700 Insomnia, unspecified: Secondary | ICD-10-CM | POA: Diagnosis not present

## 2019-12-16 DIAGNOSIS — F4310 Post-traumatic stress disorder, unspecified: Secondary | ICD-10-CM | POA: Diagnosis not present

## 2019-12-29 DIAGNOSIS — B351 Tinea unguium: Secondary | ICD-10-CM | POA: Diagnosis not present

## 2019-12-29 DIAGNOSIS — L84 Corns and callosities: Secondary | ICD-10-CM | POA: Diagnosis not present

## 2019-12-29 DIAGNOSIS — R600 Localized edema: Secondary | ICD-10-CM | POA: Diagnosis not present

## 2019-12-29 DIAGNOSIS — I739 Peripheral vascular disease, unspecified: Secondary | ICD-10-CM | POA: Diagnosis not present

## 2019-12-30 DIAGNOSIS — G4700 Insomnia, unspecified: Secondary | ICD-10-CM | POA: Diagnosis not present

## 2019-12-30 DIAGNOSIS — F4310 Post-traumatic stress disorder, unspecified: Secondary | ICD-10-CM | POA: Diagnosis not present

## 2019-12-30 DIAGNOSIS — F329 Major depressive disorder, single episode, unspecified: Secondary | ICD-10-CM | POA: Diagnosis not present

## 2020-01-02 DIAGNOSIS — Z8739 Personal history of other diseases of the musculoskeletal system and connective tissue: Secondary | ICD-10-CM | POA: Diagnosis not present

## 2020-01-02 DIAGNOSIS — G4700 Insomnia, unspecified: Secondary | ICD-10-CM | POA: Diagnosis not present

## 2020-01-02 DIAGNOSIS — G8929 Other chronic pain: Secondary | ICD-10-CM | POA: Diagnosis not present

## 2020-01-02 DIAGNOSIS — R609 Edema, unspecified: Secondary | ICD-10-CM | POA: Diagnosis not present

## 2020-01-02 DIAGNOSIS — I10 Essential (primary) hypertension: Secondary | ICD-10-CM | POA: Diagnosis not present

## 2020-01-02 DIAGNOSIS — F329 Major depressive disorder, single episode, unspecified: Secondary | ICD-10-CM | POA: Diagnosis not present

## 2020-01-02 DIAGNOSIS — K219 Gastro-esophageal reflux disease without esophagitis: Secondary | ICD-10-CM | POA: Diagnosis not present

## 2020-01-02 DIAGNOSIS — I4891 Unspecified atrial fibrillation: Secondary | ICD-10-CM | POA: Diagnosis not present

## 2020-03-04 DIAGNOSIS — G8929 Other chronic pain: Secondary | ICD-10-CM | POA: Diagnosis not present

## 2020-03-04 DIAGNOSIS — M25512 Pain in left shoulder: Secondary | ICD-10-CM | POA: Diagnosis not present

## 2020-03-04 DIAGNOSIS — M19022 Primary osteoarthritis, left elbow: Secondary | ICD-10-CM | POA: Diagnosis not present

## 2020-03-05 DIAGNOSIS — G8929 Other chronic pain: Secondary | ICD-10-CM | POA: Diagnosis not present

## 2020-03-05 DIAGNOSIS — I482 Chronic atrial fibrillation, unspecified: Secondary | ICD-10-CM | POA: Diagnosis not present

## 2020-03-05 DIAGNOSIS — I110 Hypertensive heart disease with heart failure: Secondary | ICD-10-CM | POA: Diagnosis not present

## 2020-03-05 DIAGNOSIS — I509 Heart failure, unspecified: Secondary | ICD-10-CM | POA: Diagnosis not present

## 2020-03-09 DIAGNOSIS — F329 Major depressive disorder, single episode, unspecified: Secondary | ICD-10-CM | POA: Diagnosis not present

## 2020-03-09 DIAGNOSIS — F4310 Post-traumatic stress disorder, unspecified: Secondary | ICD-10-CM | POA: Diagnosis not present

## 2020-03-09 DIAGNOSIS — G4700 Insomnia, unspecified: Secondary | ICD-10-CM | POA: Diagnosis not present

## 2020-03-18 DIAGNOSIS — J309 Allergic rhinitis, unspecified: Secondary | ICD-10-CM | POA: Diagnosis not present

## 2020-03-19 DIAGNOSIS — J309 Allergic rhinitis, unspecified: Secondary | ICD-10-CM | POA: Diagnosis not present

## 2020-03-22 DIAGNOSIS — R0989 Other specified symptoms and signs involving the circulatory and respiratory systems: Secondary | ICD-10-CM | POA: Diagnosis not present

## 2020-03-22 DIAGNOSIS — R05 Cough: Secondary | ICD-10-CM | POA: Diagnosis not present

## 2020-04-05 DIAGNOSIS — B351 Tinea unguium: Secondary | ICD-10-CM | POA: Diagnosis not present

## 2020-04-05 DIAGNOSIS — R600 Localized edema: Secondary | ICD-10-CM | POA: Diagnosis not present

## 2020-04-05 DIAGNOSIS — R609 Edema, unspecified: Secondary | ICD-10-CM | POA: Diagnosis not present

## 2020-04-05 DIAGNOSIS — R0981 Nasal congestion: Secondary | ICD-10-CM | POA: Diagnosis not present

## 2020-04-05 DIAGNOSIS — I739 Peripheral vascular disease, unspecified: Secondary | ICD-10-CM | POA: Diagnosis not present

## 2020-04-05 DIAGNOSIS — L84 Corns and callosities: Secondary | ICD-10-CM | POA: Diagnosis not present

## 2020-04-27 DIAGNOSIS — L814 Other melanin hyperpigmentation: Secondary | ICD-10-CM | POA: Diagnosis not present

## 2020-04-27 DIAGNOSIS — L570 Actinic keratosis: Secondary | ICD-10-CM | POA: Diagnosis not present

## 2020-04-27 DIAGNOSIS — D1801 Hemangioma of skin and subcutaneous tissue: Secondary | ICD-10-CM | POA: Diagnosis not present

## 2020-04-27 DIAGNOSIS — Z85068 Personal history of other malignant neoplasm of small intestine: Secondary | ICD-10-CM | POA: Diagnosis not present

## 2020-04-27 DIAGNOSIS — L821 Other seborrheic keratosis: Secondary | ICD-10-CM | POA: Diagnosis not present

## 2020-05-25 DIAGNOSIS — L570 Actinic keratosis: Secondary | ICD-10-CM | POA: Diagnosis not present

## 2020-05-25 DIAGNOSIS — L820 Inflamed seborrheic keratosis: Secondary | ICD-10-CM | POA: Diagnosis not present

## 2020-05-25 DIAGNOSIS — L821 Other seborrheic keratosis: Secondary | ICD-10-CM | POA: Diagnosis not present

## 2020-05-25 DIAGNOSIS — D1801 Hemangioma of skin and subcutaneous tissue: Secondary | ICD-10-CM | POA: Diagnosis not present

## 2020-05-25 DIAGNOSIS — L814 Other melanin hyperpigmentation: Secondary | ICD-10-CM | POA: Diagnosis not present

## 2020-05-25 DIAGNOSIS — F331 Major depressive disorder, recurrent, moderate: Secondary | ICD-10-CM | POA: Diagnosis not present

## 2020-05-25 DIAGNOSIS — F4310 Post-traumatic stress disorder, unspecified: Secondary | ICD-10-CM | POA: Diagnosis not present

## 2020-05-25 DIAGNOSIS — G4700 Insomnia, unspecified: Secondary | ICD-10-CM | POA: Diagnosis not present

## 2020-05-25 DIAGNOSIS — Z85068 Personal history of other malignant neoplasm of small intestine: Secondary | ICD-10-CM | POA: Diagnosis not present

## 2020-05-27 DIAGNOSIS — Z23 Encounter for immunization: Secondary | ICD-10-CM | POA: Diagnosis not present

## 2020-06-04 DIAGNOSIS — M159 Polyosteoarthritis, unspecified: Secondary | ICD-10-CM | POA: Diagnosis not present

## 2020-06-04 DIAGNOSIS — F339 Major depressive disorder, recurrent, unspecified: Secondary | ICD-10-CM | POA: Diagnosis not present

## 2020-06-04 DIAGNOSIS — K219 Gastro-esophageal reflux disease without esophagitis: Secondary | ICD-10-CM | POA: Diagnosis not present

## 2020-06-04 DIAGNOSIS — I482 Chronic atrial fibrillation, unspecified: Secondary | ICD-10-CM | POA: Diagnosis not present

## 2020-06-04 DIAGNOSIS — I10 Essential (primary) hypertension: Secondary | ICD-10-CM | POA: Diagnosis not present

## 2020-06-22 DIAGNOSIS — L570 Actinic keratosis: Secondary | ICD-10-CM | POA: Diagnosis not present

## 2020-06-22 DIAGNOSIS — Z85068 Personal history of other malignant neoplasm of small intestine: Secondary | ICD-10-CM | POA: Diagnosis not present

## 2020-06-22 DIAGNOSIS — L821 Other seborrheic keratosis: Secondary | ICD-10-CM | POA: Diagnosis not present

## 2020-06-22 DIAGNOSIS — L814 Other melanin hyperpigmentation: Secondary | ICD-10-CM | POA: Diagnosis not present

## 2020-06-24 DIAGNOSIS — R609 Edema, unspecified: Secondary | ICD-10-CM | POA: Diagnosis not present

## 2020-06-24 DIAGNOSIS — R635 Abnormal weight gain: Secondary | ICD-10-CM | POA: Diagnosis not present

## 2020-06-28 DIAGNOSIS — F331 Major depressive disorder, recurrent, moderate: Secondary | ICD-10-CM | POA: Diagnosis not present

## 2020-06-28 DIAGNOSIS — F4310 Post-traumatic stress disorder, unspecified: Secondary | ICD-10-CM | POA: Diagnosis not present

## 2020-07-05 ENCOUNTER — Emergency Department (HOSPITAL_COMMUNITY): Payer: No Typology Code available for payment source

## 2020-07-05 ENCOUNTER — Other Ambulatory Visit: Payer: Self-pay

## 2020-07-05 ENCOUNTER — Emergency Department (HOSPITAL_COMMUNITY)
Admission: EM | Admit: 2020-07-05 | Discharge: 2020-07-06 | Disposition: A | Discharge status: Home / Self Care | Payer: No Typology Code available for payment source | Attending: Emergency Medicine | Admitting: Emergency Medicine

## 2020-07-05 ENCOUNTER — Encounter (HOSPITAL_COMMUNITY): Payer: Self-pay

## 2020-07-05 DIAGNOSIS — S0093XA Contusion of unspecified part of head, initial encounter: Secondary | ICD-10-CM | POA: Diagnosis not present

## 2020-07-05 DIAGNOSIS — R000 Tachycardia, unspecified: Secondary | ICD-10-CM | POA: Diagnosis not present

## 2020-07-05 DIAGNOSIS — I10 Essential (primary) hypertension: Secondary | ICD-10-CM | POA: Diagnosis not present

## 2020-07-05 DIAGNOSIS — S61411A Laceration without foreign body of right hand, initial encounter: Secondary | ICD-10-CM | POA: Insufficient documentation

## 2020-07-05 DIAGNOSIS — R609 Edema, unspecified: Secondary | ICD-10-CM | POA: Diagnosis not present

## 2020-07-05 DIAGNOSIS — T148XXA Other injury of unspecified body region, initial encounter: Secondary | ICD-10-CM

## 2020-07-05 DIAGNOSIS — R0902 Hypoxemia: Secondary | ICD-10-CM | POA: Diagnosis not present

## 2020-07-05 DIAGNOSIS — F331 Major depressive disorder, recurrent, moderate: Secondary | ICD-10-CM | POA: Diagnosis not present

## 2020-07-05 DIAGNOSIS — W19XXXA Unspecified fall, initial encounter: Secondary | ICD-10-CM

## 2020-07-05 DIAGNOSIS — F4310 Post-traumatic stress disorder, unspecified: Secondary | ICD-10-CM | POA: Diagnosis not present

## 2020-07-05 DIAGNOSIS — S0990XA Unspecified injury of head, initial encounter: Secondary | ICD-10-CM | POA: Diagnosis present

## 2020-07-05 MED ORDER — HYDROCODONE-ACETAMINOPHEN 5-325 MG PO TABS
1.0000 | ORAL_TABLET | Freq: Once | ORAL | Status: AC
  Administered 2020-07-05: 1 via ORAL
  Filled 2020-07-05: qty 1

## 2020-07-05 NOTE — ED Notes (Signed)
Patient transported to CT 

## 2020-07-05 NOTE — Progress Notes (Signed)
   07/05/20 1723  Clinical Encounter Type  Visited With Patient  Visit Type Trauma  Referral From Nurse  Consult/Referral To Chaplain   No current need for chaplain. Chaplain told unit secretary that chaplain remains available as needed.  This note was prepared by Chaplain Resident, Dante Gang, MDiv. Chaplain remains available as needed through the on-call pager: [phone].

## 2020-07-05 NOTE — ED Notes (Signed)
Ptar called by Eulice Rutledge  pt is number 7 on the list 

## 2020-07-05 NOTE — ED Notes (Signed)
Portable xray at bedside.

## 2020-07-05 NOTE — Discharge Instructions (Signed)
Your imaging today did not show any fractures or dislocations.  You had a skin tear to your cheek and your hand.  Please keep them dry and clean.  Please watch for signs of infection.  Please rest and stay hydrated.  If any symptoms change or worsen, please return to the nearest emergency department.
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
## 2020-07-05 NOTE — ED Notes (Signed)
Pt was able to drink water w/o difficulty. Pt's L hand skin tear cleaned and closed w/steri strips and covered with non-adherent dressing. R cheek and forehead wounds cleaned.
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
## 2020-07-05 NOTE — ED Notes (Signed)
Pt returned from CT scan.

## 2020-07-06 ENCOUNTER — Encounter: Payer: Self-pay | Admitting: Internal Medicine

## 2020-07-06 DIAGNOSIS — Z743 Need for continuous supervision: Secondary | ICD-10-CM | POA: Diagnosis not present

## 2020-07-06 DIAGNOSIS — R5381 Other malaise: Secondary | ICD-10-CM | POA: Diagnosis not present

## 2020-07-06 DIAGNOSIS — R58 Hemorrhage, not elsewhere classified: Secondary | ICD-10-CM | POA: Diagnosis not present

## 2020-07-06 DIAGNOSIS — W19XXXD Unspecified fall, subsequent encounter: Secondary | ICD-10-CM | POA: Diagnosis not present

## 2020-07-06 DIAGNOSIS — R279 Unspecified lack of coordination: Secondary | ICD-10-CM | POA: Diagnosis not present

## 2020-07-06 DIAGNOSIS — T1490XA Injury, unspecified, initial encounter: Secondary | ICD-10-CM | POA: Diagnosis not present

## 2020-07-06 DIAGNOSIS — G8929 Other chronic pain: Secondary | ICD-10-CM | POA: Diagnosis not present

## 2020-07-06 DIAGNOSIS — M2550 Pain in unspecified joint: Secondary | ICD-10-CM | POA: Diagnosis not present

## 2020-07-06 DIAGNOSIS — R609 Edema, unspecified: Secondary | ICD-10-CM | POA: Diagnosis not present

## 2020-07-06 DIAGNOSIS — I10 Essential (primary) hypertension: Secondary | ICD-10-CM | POA: Diagnosis not present

## 2020-07-12 DIAGNOSIS — F331 Major depressive disorder, recurrent, moderate: Secondary | ICD-10-CM | POA: Diagnosis not present

## 2020-07-12 DIAGNOSIS — F4310 Post-traumatic stress disorder, unspecified: Secondary | ICD-10-CM | POA: Diagnosis not present

## 2020-07-14 DIAGNOSIS — M25511 Pain in right shoulder: Secondary | ICD-10-CM | POA: Diagnosis not present

## 2020-07-17 DIAGNOSIS — R0989 Other specified symptoms and signs involving the circulatory and respiratory systems: Secondary | ICD-10-CM | POA: Diagnosis not present

## 2020-07-17 DIAGNOSIS — R531 Weakness: Secondary | ICD-10-CM | POA: Diagnosis not present

## 2020-07-17 DIAGNOSIS — R059 Cough, unspecified: Secondary | ICD-10-CM | POA: Diagnosis not present

## 2020-07-22 DIAGNOSIS — R5381 Other malaise: Secondary | ICD-10-CM | POA: Diagnosis not present

## 2020-07-22 DIAGNOSIS — R059 Cough, unspecified: Secondary | ICD-10-CM | POA: Diagnosis not present

## 2020-07-22 DIAGNOSIS — R609 Edema, unspecified: Secondary | ICD-10-CM | POA: Diagnosis not present

## 2020-07-22 DIAGNOSIS — R0989 Other specified symptoms and signs involving the circulatory and respiratory systems: Secondary | ICD-10-CM | POA: Diagnosis not present

## 2020-07-26 DIAGNOSIS — M79672 Pain in left foot: Secondary | ICD-10-CM | POA: Diagnosis not present

## 2020-07-26 DIAGNOSIS — F331 Major depressive disorder, recurrent, moderate: Secondary | ICD-10-CM | POA: Diagnosis not present

## 2020-07-26 DIAGNOSIS — M79671 Pain in right foot: Secondary | ICD-10-CM | POA: Diagnosis not present

## 2020-07-26 DIAGNOSIS — G4700 Insomnia, unspecified: Secondary | ICD-10-CM | POA: Diagnosis not present

## 2020-07-26 DIAGNOSIS — F4310 Post-traumatic stress disorder, unspecified: Secondary | ICD-10-CM | POA: Diagnosis not present

## 2020-07-27 DIAGNOSIS — Z85068 Personal history of other malignant neoplasm of small intestine: Secondary | ICD-10-CM | POA: Diagnosis not present

## 2020-07-27 DIAGNOSIS — L821 Other seborrheic keratosis: Secondary | ICD-10-CM | POA: Diagnosis not present

## 2020-07-27 DIAGNOSIS — L570 Actinic keratosis: Secondary | ICD-10-CM | POA: Diagnosis not present

## 2020-07-30 DIAGNOSIS — I10 Essential (primary) hypertension: Secondary | ICD-10-CM | POA: Diagnosis not present

## 2020-07-30 DIAGNOSIS — K219 Gastro-esophageal reflux disease without esophagitis: Secondary | ICD-10-CM | POA: Diagnosis not present

## 2020-07-30 DIAGNOSIS — R609 Edema, unspecified: Secondary | ICD-10-CM | POA: Diagnosis not present

## 2020-07-30 DIAGNOSIS — I4891 Unspecified atrial fibrillation: Secondary | ICD-10-CM | POA: Diagnosis not present

## 2020-07-30 DIAGNOSIS — Z8739 Personal history of other diseases of the musculoskeletal system and connective tissue: Secondary | ICD-10-CM | POA: Diagnosis not present

## 2020-07-30 DIAGNOSIS — G8929 Other chronic pain: Secondary | ICD-10-CM | POA: Diagnosis not present

## 2020-07-30 DIAGNOSIS — F32A Depression, unspecified: Secondary | ICD-10-CM | POA: Diagnosis not present

## 2020-07-30 DIAGNOSIS — G4700 Insomnia, unspecified: Secondary | ICD-10-CM | POA: Diagnosis not present

## 2020-08-03 DIAGNOSIS — F331 Major depressive disorder, recurrent, moderate: Secondary | ICD-10-CM | POA: Diagnosis not present

## 2020-08-03 DIAGNOSIS — F4310 Post-traumatic stress disorder, unspecified: Secondary | ICD-10-CM | POA: Diagnosis not present

## 2020-08-03 DIAGNOSIS — G4700 Insomnia, unspecified: Secondary | ICD-10-CM | POA: Diagnosis not present

## 2022-08-21 DEATH — deceased
